# Patient Record
Sex: Female | Born: 1955
Health system: Southern US, Community
[De-identification: ages and names within clinical notes are randomized; demographics above are authoritative.]

## PROBLEM LIST (undated history)

## (undated) DIAGNOSIS — N393 Stress incontinence (female) (male): Secondary | ICD-10-CM

## (undated) DIAGNOSIS — F419 Anxiety disorder, unspecified: Secondary | ICD-10-CM

## (undated) DIAGNOSIS — L309 Dermatitis, unspecified: Secondary | ICD-10-CM

## (undated) DIAGNOSIS — E039 Hypothyroidism, unspecified: Secondary | ICD-10-CM

## (undated) DIAGNOSIS — I10 Essential (primary) hypertension: Secondary | ICD-10-CM

## (undated) DIAGNOSIS — F32A Depression, unspecified: Secondary | ICD-10-CM

## (undated) DIAGNOSIS — F329 Major depressive disorder, single episode, unspecified: Secondary | ICD-10-CM

## (undated) HISTORY — PX: TUBAL LIGATION: SHX77

## (undated) HISTORY — PX: BUNIONECTOMY: SHX129

## (undated) HISTORY — DX: Essential (primary) hypertension: I10

## (undated) HISTORY — DX: Anxiety disorder, unspecified: F41.9

## (undated) HISTORY — DX: Hypothyroidism, unspecified: E03.9

## (undated) HISTORY — DX: Dermatitis, unspecified: L30.9

## (undated) HISTORY — DX: Major depressive disorder, single episode, unspecified: F32.9

## (undated) HISTORY — DX: Depression, unspecified: F32.A

---

## 2004-02-07 ENCOUNTER — Other Ambulatory Visit: Admission: RE | Admit: 2004-02-07 | Discharge: 2004-02-07 | Payer: Self-pay | Admitting: Obstetrics and Gynecology

## 2005-02-03 ENCOUNTER — Ambulatory Visit (HOSPITAL_COMMUNITY): Admission: RE | Admit: 2005-02-03 | Discharge: 2005-02-03 | Payer: Self-pay | Admitting: Obstetrics and Gynecology

## 2005-09-23 ENCOUNTER — Encounter: Admission: RE | Admit: 2005-09-23 | Discharge: 2005-09-23 | Payer: Self-pay | Admitting: Family Medicine

## 2006-03-13 ENCOUNTER — Ambulatory Visit (HOSPITAL_COMMUNITY): Admission: RE | Admit: 2006-03-13 | Discharge: 2006-03-13 | Payer: Self-pay | Admitting: Obstetrics and Gynecology

## 2006-06-16 ENCOUNTER — Encounter: Admission: RE | Admit: 2006-06-16 | Discharge: 2006-06-16 | Payer: Self-pay | Admitting: Family Medicine

## 2007-01-01 ENCOUNTER — Encounter: Admission: RE | Admit: 2007-01-01 | Discharge: 2007-01-01 | Payer: Self-pay | Admitting: Endocrinology

## 2007-03-29 ENCOUNTER — Ambulatory Visit (HOSPITAL_COMMUNITY): Admission: RE | Admit: 2007-03-29 | Discharge: 2007-03-29 | Payer: Self-pay | Admitting: Obstetrics and Gynecology

## 2007-10-18 LAB — HM COLONOSCOPY

## 2007-12-07 ENCOUNTER — Ambulatory Visit (HOSPITAL_COMMUNITY): Admission: RE | Admit: 2007-12-07 | Discharge: 2007-12-07 | Payer: Self-pay | Admitting: Internal Medicine

## 2008-04-10 ENCOUNTER — Ambulatory Visit (HOSPITAL_COMMUNITY): Admission: RE | Admit: 2008-04-10 | Discharge: 2008-04-10 | Payer: Self-pay | Admitting: Obstetrics and Gynecology

## 2008-06-03 ENCOUNTER — Emergency Department (HOSPITAL_COMMUNITY): Admission: EM | Admit: 2008-06-03 | Discharge: 2008-06-03 | Payer: Self-pay | Admitting: Family Medicine

## 2009-04-13 ENCOUNTER — Ambulatory Visit (HOSPITAL_COMMUNITY): Admission: RE | Admit: 2009-04-13 | Discharge: 2009-04-13 | Payer: Self-pay | Admitting: Obstetrics and Gynecology

## 2010-04-19 ENCOUNTER — Ambulatory Visit (HOSPITAL_COMMUNITY): Admission: RE | Admit: 2010-04-19 | Discharge: 2010-04-19 | Payer: Self-pay | Admitting: Obstetrics and Gynecology

## 2010-04-30 LAB — HM PAP SMEAR: HM Pap smear: NORMAL

## 2011-05-02 ENCOUNTER — Other Ambulatory Visit (HOSPITAL_COMMUNITY): Payer: Self-pay | Admitting: Obstetrics and Gynecology

## 2011-05-02 DIAGNOSIS — Z1231 Encounter for screening mammogram for malignant neoplasm of breast: Secondary | ICD-10-CM

## 2011-05-07 ENCOUNTER — Other Ambulatory Visit: Payer: Self-pay | Admitting: Obstetrics and Gynecology

## 2011-05-07 DIAGNOSIS — Z1231 Encounter for screening mammogram for malignant neoplasm of breast: Secondary | ICD-10-CM

## 2011-05-07 DIAGNOSIS — M8580 Other specified disorders of bone density and structure, unspecified site: Secondary | ICD-10-CM

## 2011-05-14 ENCOUNTER — Ambulatory Visit
Admission: RE | Admit: 2011-05-14 | Discharge: 2011-05-14 | Disposition: A | Discharge status: Home / Self Care | Payer: 59 | Source: Ambulatory Visit | Attending: Obstetrics and Gynecology | Admitting: Obstetrics and Gynecology

## 2011-05-14 DIAGNOSIS — M8580 Other specified disorders of bone density and structure, unspecified site: Secondary | ICD-10-CM

## 2011-05-14 DIAGNOSIS — Z1231 Encounter for screening mammogram for malignant neoplasm of breast: Secondary | ICD-10-CM

## 2011-05-20 ENCOUNTER — Ambulatory Visit (HOSPITAL_COMMUNITY): Payer: 59

## 2012-04-29 ENCOUNTER — Other Ambulatory Visit: Payer: Self-pay | Admitting: Obstetrics and Gynecology

## 2012-04-29 DIAGNOSIS — Z1231 Encounter for screening mammogram for malignant neoplasm of breast: Secondary | ICD-10-CM

## 2012-05-20 ENCOUNTER — Ambulatory Visit
Admission: RE | Admit: 2012-05-20 | Discharge: 2012-05-20 | Disposition: A | Discharge status: Home / Self Care | Payer: 59 | Source: Ambulatory Visit | Attending: Obstetrics and Gynecology | Admitting: Obstetrics and Gynecology

## 2012-05-20 DIAGNOSIS — Z1231 Encounter for screening mammogram for malignant neoplasm of breast: Secondary | ICD-10-CM

## 2012-10-15 ENCOUNTER — Other Ambulatory Visit: Payer: Self-pay | Admitting: Physician Assistant

## 2012-10-15 DIAGNOSIS — E041 Nontoxic single thyroid nodule: Secondary | ICD-10-CM

## 2012-10-19 ENCOUNTER — Ambulatory Visit
Admission: RE | Admit: 2012-10-19 | Discharge: 2012-10-19 | Disposition: A | Discharge status: Home / Self Care | Payer: 59 | Source: Ambulatory Visit | Attending: Physician Assistant | Admitting: Physician Assistant

## 2012-10-19 DIAGNOSIS — E041 Nontoxic single thyroid nodule: Secondary | ICD-10-CM

## 2013-01-18 ENCOUNTER — Telehealth: Payer: Self-pay | Admitting: Physician Assistant

## 2013-01-18 DIAGNOSIS — E039 Hypothyroidism, unspecified: Secondary | ICD-10-CM

## 2013-01-18 MED ORDER — LEVOTHYROXINE SODIUM 112 MCG PO TABS: 112.0000 ug | ORAL_TABLET | Freq: Every day | ORAL | Status: DC

## 2013-01-18 NOTE — Telephone Encounter (Signed)
Medication refilled per protocol. 

## 2013-02-21 ENCOUNTER — Ambulatory Visit (INDEPENDENT_AMBULATORY_CARE_PROVIDER_SITE_OTHER): Payer: 59 | Admitting: Physician Assistant

## 2013-02-21 ENCOUNTER — Encounter: Payer: Self-pay | Admitting: Physician Assistant

## 2013-02-21 VITALS — BP 136/94 | HR 72 | Temp 97.0°F | Resp 18 | Ht 64.25 in | Wt 157.0 lb

## 2013-02-21 DIAGNOSIS — M19079 Primary osteoarthritis, unspecified ankle and foot: Secondary | ICD-10-CM

## 2013-02-21 DIAGNOSIS — I10 Essential (primary) hypertension: Secondary | ICD-10-CM

## 2013-02-21 DIAGNOSIS — M19072 Primary osteoarthritis, left ankle and foot: Secondary | ICD-10-CM

## 2013-02-21 DIAGNOSIS — M722 Plantar fascial fibromatosis: Secondary | ICD-10-CM

## 2013-02-21 DIAGNOSIS — E039 Hypothyroidism, unspecified: Secondary | ICD-10-CM

## 2013-02-21 DIAGNOSIS — F419 Anxiety disorder, unspecified: Secondary | ICD-10-CM | POA: Insufficient documentation

## 2013-02-21 DIAGNOSIS — F329 Major depressive disorder, single episode, unspecified: Secondary | ICD-10-CM

## 2013-02-21 DIAGNOSIS — F32A Depression, unspecified: Secondary | ICD-10-CM | POA: Insufficient documentation

## 2013-02-21 DIAGNOSIS — F411 Generalized anxiety disorder: Secondary | ICD-10-CM

## 2013-02-21 MED ORDER — CELECOXIB 200 MG PO CAPS: 200.0000 mg | ORAL_CAPSULE | Freq: Every day | ORAL | Status: DC

## 2013-02-21 NOTE — Progress Notes (Signed)
Patient ID: Yvonne Wu MRN: [id number], DOB: December 15, 1955, 57 y.o. Date of Encounter: @DATE @  Chief Complaint:  Chief Complaint  Patient presents with  . c/o left foot pain radiates up leg  hx of  wants rx celebrex    HPI: 57 y.o. year old female  presents with c/o foot pain. Also for routine f/u.  1- Says she saw Dr. Lestine Box regarding pain in her left foot in past. He dxed it as arthritis. She took Celebrex for a while and it had resolved. Recently developed same type of pain again. Pain across top of the foot, radiates up anterior shin at times. Wants refill on Celebrex.   2- Pain in arch of left foot also. Had plantar fasciitis in past-had injection. Had resolved. Sore again now.  3- Thyroid; At lab 2/14, TSH was slightly abn and dose was changed. She states she IS taking 112 mcg as directed.   4- Her husband passed away in Feb 24, 2023. See prior OV notes. She has seen me multiple times regarding anxiety/depression f/u-she told me about his diagnosis of acute leukemia back in 12/2010. Today she says she thinks the current medications are working ok. She does not think medicines need to be adjusted. Is taking the Wellbutrin. Current Klonopin is working for prn use. Needs no Rx now.   History reviewed. No pertinent past medical history.   Home Meds: See attached medication section for current medication list. Any medications entered into computer today will not appear on this note's list. The medications listed below were entered prior to today. Current Outpatient Prescriptions on File Prior to Visit  Medication Sig Dispense Refill  . levothyroxine (SYNTHROID, LEVOTHROID) 112 MCG tablet Take 1 tablet (112 mcg total) by mouth daily before breakfast.  90 tablet  0   No current facility-administered medications on file prior to visit.    Allergies: No Known Allergies  History   Social History  . Marital Status: Married    Spouse Name: N/A    Number of Children: N/A  . Years of  Education: N/A   Occupational History  . Not on file.   Social History Main Topics  . Smoking status: Former Smoker    Quit date: 02/22/1995  . Smokeless tobacco: Never Used  . Alcohol Use: No  . Drug Use: No  . Sexually Active: Not on file   Other Topics Concern  . Not on file   Social History Narrative  . No narrative on file    No family history on file.   Review of Systems:  See HPI for pertinent ROS. All other ROS negative.    Physical Exam: Blood pressure 136/94, pulse 72, temperature 97 F (36.1 C), temperature source Oral, resp. rate 18, height 5' 4.25" (1.632 m), weight 157 lb (71.215 kg)., Body mass index is 26.74 kg/(m^2). General:WNWD WF. Appears in no acute distress. Neck: Supple. No thyromegaly. Full ROM. No lymphadenopathy. Lungs: Clear bilaterally to auscultation without wheezes, rales, or rhonchi. Breathing is unlabored. Heart: RRR with S1 S2. No murmurs, rubs, or gallops. Abdomen: Soft, non-tender, non-distended with normoactive bowel sounds. No hepatomegaly. No rebound/guarding. No obvious abdominal masses. Musculoskeletal:  Strength and tone normal for age. Extremities/Skin: Left foot: No erythema, edema. Inspection nml. Pain with palpation at medial aspect at area b/t heel and arch.  Neuro: Alert and oriented X 3. Moves all extremities spontaneously. Gait is normal. CNII-XII grossly in tact. Psych:  Responds to questions appropriately with a normal affect.  ASSESSMENT AND PLAN:  57 y.o. year old female with  1. Osteoarthritis of left foot - celecoxib (CELEBREX) 200 MG capsule; Take 1 capsule (200 mg total) by mouth daily.  Dispense: 30 capsule; Refill: 11  2. Plantar fasciitis of left foot - celecoxib (CELEBREX) 200 MG capsule; Take 1 capsule (200 mg total) by mouth daily.  Dispense: 30 capsule; Refill: 11 She is aware to wear shoes with good arch support. Avoid walking bare foot. She has orthotices for her work shoes. Rec heel cups. Gave her  handout with stretches to do.   3. Unspecified hypothyroidism She did change dose from 125 to 112 mcg QD 11/2012. Recheck TSH now.   4. HTN (hypertension) At goal. Cont current meds. BMET nml 11/2012  5. Generalized anxiety disorder Stable, controlled with current medications  6. Depression Stable, controlled with current medications.I asked her if she needed anything regarding grieving-she says no--he was with hospice-this helped. She feels htat current meds are working and does not need change in medication.  7. Hypothyroidism See # 3 above.  - TSH   Signed, [address] [place], [place], [facility] 02/21/2013 4:48 PM

## 2013-02-22 ENCOUNTER — Telehealth: Payer: Self-pay | Admitting: Family Medicine

## 2013-02-22 DIAGNOSIS — E039 Hypothyroidism, unspecified: Secondary | ICD-10-CM

## 2013-02-22 LAB — TSH: TSH: 0.01 u[IU]/mL — ABNORMAL LOW (ref 0.350–4.500)

## 2013-02-22 MED ORDER — LEVOTHYROXINE SODIUM 100 MCG PO TABS: 100.0000 ug | ORAL_TABLET | Freq: Every day | ORAL | Status: DC

## 2013-02-22 NOTE — Telephone Encounter (Signed)
Message copied by Donne Anon on Tue Feb 22, 2013 10:07 AM ------      Message from: Allayne Butcher      Created: Tue Feb 22, 2013  7:29 AM       Can decrease dose to 100 mcg QD.      Recheck TSH 6 weeks.      FYI: In February decreased from 125 to 112.            Send in new RX for levothyroxine 100 mcg one po QD      # 30/ One      Order f/u TSH ------

## 2013-02-22 NOTE — Telephone Encounter (Signed)
Pt called for thyroid lab results.  Told TSH still to low and need to decrease levothyroxine to daily.  New Rx to pharmacy and pt told to repeat lab work in 6 weeks.  Order placed for lab.

## 2013-03-17 ENCOUNTER — Telehealth: Payer: Self-pay | Admitting: Physician Assistant

## 2013-03-17 MED ORDER — OLMESARTAN MEDOXOMIL 20 MG PO TABS: 20.0000 mg | ORAL_TABLET | Freq: Every day | ORAL | Status: DC

## 2013-03-17 NOTE — Telephone Encounter (Signed)
Benicar 20mg  take one tablet by mouth daily #30 last refill04/23/2014 last ov 02/21/13

## 2013-03-17 NOTE — Telephone Encounter (Signed)
Medication refilled per protocol. 

## 2013-04-15 ENCOUNTER — Other Ambulatory Visit: Payer: Self-pay

## 2013-04-15 DIAGNOSIS — Z1231 Encounter for screening mammogram for malignant neoplasm of breast: Secondary | ICD-10-CM

## 2013-04-19 ENCOUNTER — Other Ambulatory Visit: Payer: 59

## 2013-04-19 ENCOUNTER — Other Ambulatory Visit: Payer: Self-pay | Admitting: Physician Assistant

## 2013-04-19 DIAGNOSIS — R7989 Other specified abnormal findings of blood chemistry: Secondary | ICD-10-CM

## 2013-04-19 DIAGNOSIS — E039 Hypothyroidism, unspecified: Secondary | ICD-10-CM

## 2013-04-19 NOTE — Telephone Encounter (Signed)
?   OK to Refill  

## 2013-04-19 NOTE — Telephone Encounter (Signed)
Approved for 60 plus 2 additional refills

## 2013-04-19 NOTE — Telephone Encounter (Signed)
Med called out 

## 2013-04-20 LAB — TSH: TSH: 0.009 u[IU]/mL — ABNORMAL LOW (ref 0.350–4.500)

## 2013-04-21 MED ORDER — LEVOTHYROXINE SODIUM 88 MCG PO TABS: 88.0000 ug | ORAL_TABLET | Freq: Every day | ORAL | Status: DC

## 2013-04-21 NOTE — Addendum Note (Signed)
Addended by: Elvina Mattes T on: 04/21/2013 10:44 AM   Modules accepted: Orders

## 2013-05-19 ENCOUNTER — Other Ambulatory Visit: Payer: Self-pay | Admitting: Obstetrics and Gynecology

## 2013-05-19 DIAGNOSIS — M8580 Other specified disorders of bone density and structure, unspecified site: Secondary | ICD-10-CM

## 2013-05-23 ENCOUNTER — Ambulatory Visit: Payer: 59

## 2013-06-13 ENCOUNTER — Other Ambulatory Visit: Payer: 59

## 2013-06-13 DIAGNOSIS — R7989 Other specified abnormal findings of blood chemistry: Secondary | ICD-10-CM

## 2013-06-13 DIAGNOSIS — E039 Hypothyroidism, unspecified: Secondary | ICD-10-CM

## 2013-06-15 ENCOUNTER — Ambulatory Visit: Payer: 59

## 2013-06-15 ENCOUNTER — Other Ambulatory Visit: Payer: 59

## 2013-06-16 ENCOUNTER — Ambulatory Visit
Admission: RE | Admit: 2013-06-16 | Discharge: 2013-06-16 | Disposition: A | Discharge status: Home / Self Care | Payer: 59 | Source: Ambulatory Visit | Attending: Obstetrics and Gynecology | Admitting: Obstetrics and Gynecology

## 2013-06-16 ENCOUNTER — Telehealth: Payer: Self-pay | Admitting: Family Medicine

## 2013-06-16 ENCOUNTER — Ambulatory Visit
Admission: RE | Admit: 2013-06-16 | Discharge: 2013-06-16 | Disposition: A | Discharge status: Home / Self Care | Payer: 59 | Source: Ambulatory Visit

## 2013-06-16 DIAGNOSIS — E039 Hypothyroidism, unspecified: Secondary | ICD-10-CM

## 2013-06-16 DIAGNOSIS — Z1231 Encounter for screening mammogram for malignant neoplasm of breast: Secondary | ICD-10-CM

## 2013-06-16 DIAGNOSIS — M8580 Other specified disorders of bone density and structure, unspecified site: Secondary | ICD-10-CM

## 2013-06-16 LAB — HM MAMMOGRAPHY: HM MAMMO: NORMAL

## 2013-06-16 MED ORDER — LEVOTHYROXINE SODIUM 75 MCG PO TABS: 75.0000 ug | ORAL_TABLET | Freq: Every day | ORAL | Status: DC

## 2013-06-16 NOTE — Telephone Encounter (Signed)
Message copied by Donne Anon on Thu Jun 16, 2013  2:19 PM ------      Message from: Allayne Butcher      Created: Tue Jun 14, 2013  7:55 PM       Tell her that I am sorry for all the adjustments and lab checks.       But, this has been an unpredictable response !!       02/21/13 her TSH was 0.010 so dose was decreased to 100 mcg      04/19/13 her TSH was 0.009 so her dose was decreased to 88 mcg      Now 06/13/13 her TSH is 0.021--Tell her to decrease dose to 75 mcg.       Can wait 3 months to recheck TSH if she wants !!!      Order Rx for and TSH ------

## 2013-06-16 NOTE — Telephone Encounter (Signed)
Pt aware of lab result and need to change thyroid dose again.  Repeat labs in 3 months.  Rx and lab ordered

## 2013-06-27 ENCOUNTER — Encounter: Payer: Self-pay | Admitting: Physician Assistant

## 2013-06-27 ENCOUNTER — Ambulatory Visit (INDEPENDENT_AMBULATORY_CARE_PROVIDER_SITE_OTHER): Payer: 59 | Admitting: Physician Assistant

## 2013-06-27 VITALS — BP 128/90 | HR 64 | Temp 98.1°F | Resp 18 | Wt 161.0 lb

## 2013-06-27 DIAGNOSIS — M545 Low back pain, unspecified: Secondary | ICD-10-CM

## 2013-06-27 DIAGNOSIS — M5432 Sciatica, left side: Secondary | ICD-10-CM

## 2013-06-27 DIAGNOSIS — M5430 Sciatica, unspecified side: Secondary | ICD-10-CM

## 2013-06-27 MED ORDER — CYCLOBENZAPRINE HCL 10 MG PO TABS: 10.0000 mg | ORAL_TABLET | Freq: Three times a day (TID) | ORAL | Status: DC | PRN

## 2013-06-27 MED ORDER — PREDNISONE 20 MG PO TABS: ORAL_TABLET | ORAL | Status: DC

## 2013-06-27 NOTE — Progress Notes (Signed)
Patient ID: Yvonne Wu MRN: [id number], DOB: 12/11/1955, 58 y.o. Date of Encounter: 06/27/2013, 12:31 PM    Chief Complaint:  Chief Complaint  Patient presents with  . left hip pain    worse at night x 2-3 wks     HPI: 57 y.o. year old white female is here for evaluation of discomfort starting in her left buttock going down her left leg to the knee area.  This has been a new problem for her recently. She says it just started approximately 3 weeks ago. Just prior to the time of the onset of the symptoms she had done no unusual activity. She has done no heavy lifting etc. She had no acute injury or trauma. She says that she has mostly noticed the discomfort during the night when she is sleeping. She is actually awoken by this discomfort. As well in the morning when she begins to wake up she says that she has to go ahead and get up out of bed and move around because the discomfort is there at that time. Also she notices the discomfort when she's sitting in the car. She says that when she does a lot of housework and cleaning the house she does feel some tightness and discomfort across her low back. Otherwise no other low back pain. No weakness in the leg or foot. No incontinence of urine or stool.  She works at [employer]. She does a lot of standing bending and lifting. Says that most she lifts is about 35 pounds. But this is her usual activity that she has done for many years. Again she says she has done no unusual lifting or activity around the time of onset of symptoms.  Home Meds: See attached medication section for any medications that were entered at today's visit. The computer does not put those onto this list.The following list is a list of meds entered prior to today's visit.   Current Outpatient Prescriptions on File Prior to Visit  Medication Sig Dispense Refill  . buPROPion (WELLBUTRIN XL) 300 MG 24 hr tablet Take 300 mg by mouth daily.      . celecoxib (CELEBREX) 200 MG capsule  Take 1 capsule (200 mg total) by mouth daily.  30 capsule  11  . cholecalciferol (VITAMIN D) 1000 UNITS tablet Take 1,000 Units by mouth daily.      . clonazePAM (KLONOPIN) 0.5 MG tablet TAKE (1) TABLET BY MOUTH TWICE A DAY AS NEEDED.  60 tablet  2  . fluticasone (FLONASE) 50 MCG/ACT nasal spray Place 2 sprays into the nose as needed for rhinitis.      Marland Kitchen levothyroxine (SYNTHROID, LEVOTHROID) 75 MCG tablet Take 1 tablet (75 mcg total) by mouth daily.  90 tablet  0  . olmesartan (BENICAR) 20 MG tablet Take 1 tablet (20 mg total) by mouth daily.  30 tablet  5   No current facility-administered medications on file prior to visit.    Allergies: No Known Allergies    Review of Systems: See HPI for pertinent ROS. All other ROS negative.    Physical Exam: Blood pressure 128/90, pulse 64, temperature 98.1 F (36.7 C), temperature source Oral, resp. rate 18, weight 161 lb (73.029 kg)., Body mass index is 27.42 kg/(m^2). General: Well-nourished well-developed white female Appears in no acute distress. Sitting in the chair in exam room comfortably. Lungs: Clear bilaterally to auscultation without wheezes, rales, or rhonchi. Breathing is unlabored. Heart: Regular rhythm. No murmurs, rubs, or gallops. Msk:  Strength and  tone normal for age. Low back: There is minimal pain and tightness in the lumbar low back with palpation.  There is tenderness with palpation of the left sciatic notch compared to the right. Bilateral straight straight leg raise is normal. Bilateral hip abduction is normal. Patellar reflexes are 2+ and equal bilaterally and normal. Neuro: Alert and oriented X 3. Moves all extremities spontaneously. Gait is normal. CNII-XII grossly in tact. Psych:  Responds to questions appropriately with a normal affect.     ASSESSMENT AND PLAN:  57 y.o. year old female with  1. Sciatica, left - predniSONE (DELTASONE) 20 MG tablet; Take 3 daily for 2 days, then 2 daily for 2 days, then 1 daily for  2 days.  Dispense: 12 tablet; Refill: 0  2. Low back pain - cyclobenzaprine (FLEXERIL) 10 MG tablet; Take 1 tablet (10 mg total) by mouth 3 (three) times daily as needed for muscle spasms.  Dispense: 30 tablet; Refill: 0 Cautioned regarding possible drowsiness with Lexapro. If it causes drowsiness she will just use at nighttime.  Follow up if symptoms do not resolve with the use of prednisone. Also cautioned of possible adverse effects of prednisone.  She is going to schedule a complete physical exam around the middle of October when her next TSH is due. She will come fasting so we can do fasting labs at the time of the visit.  [address] [place], [place], [facility] 06/27/2013 12:31 PM

## 2013-08-08 ENCOUNTER — Encounter: Payer: Self-pay | Admitting: Physician Assistant

## 2013-08-08 ENCOUNTER — Ambulatory Visit
Admission: RE | Admit: 2013-08-08 | Discharge: 2013-08-08 | Disposition: A | Discharge status: Home / Self Care | Payer: 59 | Source: Ambulatory Visit | Attending: Physician Assistant | Admitting: Physician Assistant

## 2013-08-08 ENCOUNTER — Ambulatory Visit (INDEPENDENT_AMBULATORY_CARE_PROVIDER_SITE_OTHER): Payer: 59 | Admitting: Physician Assistant

## 2013-08-08 VITALS — BP 118/94 | HR 64 | Temp 98.3°F | Resp 18 | Ht 62.75 in | Wt 162.0 lb

## 2013-08-08 DIAGNOSIS — F411 Generalized anxiety disorder: Secondary | ICD-10-CM

## 2013-08-08 DIAGNOSIS — F32A Depression, unspecified: Secondary | ICD-10-CM

## 2013-08-08 DIAGNOSIS — F329 Major depressive disorder, single episode, unspecified: Secondary | ICD-10-CM

## 2013-08-08 DIAGNOSIS — M545 Low back pain, unspecified: Secondary | ICD-10-CM

## 2013-08-08 DIAGNOSIS — F419 Anxiety disorder, unspecified: Secondary | ICD-10-CM

## 2013-08-08 DIAGNOSIS — I10 Essential (primary) hypertension: Secondary | ICD-10-CM

## 2013-08-08 DIAGNOSIS — E039 Hypothyroidism, unspecified: Secondary | ICD-10-CM

## 2013-08-08 DIAGNOSIS — Z0000 Encounter for general adult medical examination without abnormal findings: Secondary | ICD-10-CM

## 2013-08-08 LAB — LIPID PANEL
Cholesterol: 199 mg/dL (ref 0–200)
HDL: 58 mg/dL (ref >39)
Total CHOL/HDL Ratio: 3.4 Ratio
VLDL: 13 mg/dL (ref 0–40)

## 2013-08-08 LAB — COMPLETE METABOLIC PANEL WITH GFR
AST: 15 U/L (ref 0–37)
Albumin: 4.5 g/dL (ref 3.5–5.2)
Alkaline Phosphatase: 92 U/L (ref 39–117)
BUN: 9 mg/dL (ref 6–23)
Potassium: 4.3 mEq/L (ref 3.5–5.3)
Sodium: 139 mEq/L (ref 135–145)
Total Bilirubin: 0.5 mg/dL (ref 0.3–1.2)

## 2013-08-08 LAB — CBC WITH DIFFERENTIAL/PLATELET
Basophils Absolute: 0 10*3/uL (ref 0.0–0.1)
Basophils Relative: 1 % (ref 0–1)
MCHC: 34 g/dL (ref 30.0–36.0)
Neutro Abs: 1.8 10*3/uL (ref 1.7–7.7)
Neutrophils Relative %: 52 % (ref 43–77)
RDW: 14.1 % (ref 11.5–15.5)

## 2013-08-08 NOTE — Progress Notes (Signed)
Patient ID: Yvonne Wu MRN: [id number], DOB: 1956-04-11, 57 y.o. Date of Encounter: 08/08/2013,   Chief Complaint: Physical (CPE)  HPI: 57 y.o. y/o white female  here for CPE.   She sees her gynecologist for GYN exam.  She has no complaints today.   Review of Systems: Consitutional: No fever, chills, fatigue, night sweats, lymphadenopathy. No significant/unexplained weight changes. Eyes: No visual changes, eye redness, or discharge. ENT/Mouth: No ear pain, sore throat, nasal drainage, or sinus pain. Cardiovascular: No chest pressure,heaviness, tightness or squeezing, even with exertion. No increased shortness of breath or dyspnea on exertion.No palpitations, edema, orthopnea, PND. Respiratory: No cough, hemoptysis, SOB, or wheezing. Gastrointestinal: No anorexia, dysphagia, reflux, pain, nausea, vomiting, hematemesis, diarrhea, constipation, BRBPR, or melena. Breast: No mass, nodules, bulging, or retraction. No skin changes or inflammation. No nipple discharge. No lymphadenopathy. Genitourinary: No dysuria, hematuria, incontinence, vaginal discharge, pruritis, burning, abnormal bleeding, or pain. Musculoskeletal: No decreased ROM, No joint pain or swelling. No significant pain in neck, back, or extremities. Skin: No rash, pruritis, or concerning lesions. Neurological: No headache, dizziness, syncope, seizures, tremors, memory loss, coordination problems, or paresthesias. Psychological: No anxiety, depression, hallucinations, SI/HI. Endocrine: No polydipsia, polyphagia, polyuria, or known diabetes.No increased fatigue. No palpitations/rapid heart rate. No significant/unexplained weight change. All other systems were reviewed and are otherwise negative.  Past Medical History  Diagnosis Date  . Anxiety   . Depression   . Hypertension   . Hypothyroid      Past Surgical History  Procedure Laterality Date  . Tubal ligation      Home Meds:  Current Outpatient Prescriptions  on File Prior to Visit  Medication Sig Dispense Refill  . buPROPion (WELLBUTRIN XL) 300 MG 24 hr tablet Take 300 mg by mouth daily.      . Calcium Carbonate-Vitamin D (CALCIUM + D PO) Take 1 tablet by mouth 2 (two) times daily.      . celecoxib (CELEBREX) 200 MG capsule Take 1 capsule (200 mg total) by mouth daily.  30 capsule  11  . cholecalciferol (VITAMIN D) 1000 UNITS tablet Take 1,000 Units by mouth daily.      . cyclobenzaprine (FLEXERIL) 10 MG tablet Take 1 tablet (10 mg total) by mouth 3 (three) times daily as needed for muscle spasms.  30 tablet  0  . fluticasone (FLONASE) 50 MCG/ACT nasal spray Place 2 sprays into the nose as needed for rhinitis.      Marland Kitchen levothyroxine (SYNTHROID, LEVOTHROID) 75 MCG tablet Take 1 tablet (75 mcg total) by mouth daily.  90 tablet  0  . olmesartan (BENICAR) 20 MG tablet Take 1 tablet (20 mg total) by mouth daily.  30 tablet  5  . clonazePAM (KLONOPIN) 0.5 MG tablet TAKE (1) TABLET BY MOUTH TWICE A DAY AS NEEDED.  60 tablet  2   No current facility-administered medications on file prior to visit.    Allergies: No Known Allergies  History   Social History  . Marital Status: Widowed    Spouse Name: N/A    Number of Children: N/A  . Years of Education: N/A   Occupational History  . Not on file.   Social History Main Topics  . Smoking status: Former Smoker    Quit date: 02/22/1995  . Smokeless tobacco: Never Used  . Alcohol Use: No  . Drug Use: No  . Sexual Activity: Not Currently   Other Topics Concern  . Not on file   Social History Narrative   Works  at [employer].   Involves a lot of standing and bending.   Husband passed away in 04-21-14with leukemia.   Patient states that her daughter is age 30 (as of 55) and has bipolar. This daughter lives with her.   This daughter has a child who is 11 years old and another child he was 71 years old. They all live with the patient.   Patient also has a son who lives in [place].           Family History  Problem Relation Age of Onset  . Asthma Mother   . Mental illness Mother   . Cancer Father 17    lung cancer    Physical Exam: Blood pressure 118/94, pulse 64, temperature 98.3 F (36.8 C), temperature source Oral, resp. rate 18, height 5' 2.75" (1.594 m), weight 162 lb (73.483 kg)., Body mass index is 28.92 kg/(m^2). General: Well developed, well nourished,WF. Appears  in no acute distress. HEENT: Normocephalic, atraumatic. Conjunctiva pink, sclera non-icteric. Pupils 2 mm constricting to 1 mm, round, regular, and equally reactive to light and accomodation. EOMI. Internal auditory canal clear. TMs with good cone of light and without pathology. Nasal mucosa pink. Nares are without discharge. No sinus tenderness. Oral mucosa pink.  Pharynx without exudate.   Neck: Supple. Trachea midline. No thyromegaly. Full ROM. No lymphadenopathy.No Carotid Bruits. Lungs: Clear to auscultation bilaterally without wheezes, rales, or rhonchi. Breathing is of normal effort and unlabored. Cardiovascular: RRR with S1 S2. No murmurs, rubs, or gallops. Distal pulses 2+ symmetrically. No carotid or abdominal bruits. Breast: Per Gyn. Abdomen: Soft, non-tender, non-distended with normoactive bowel sounds. No hepatosplenomegaly or masses. No rebound/guarding. No CVA tenderness. No hernias.  Genitourinary: Per Gyn. Musculoskeletal: Full range of motion and 5/5 strength throughout. Without swelling, atrophy, tenderness, crepitus, or warmth. Extremities without clubbing, cyanosis, or edema. Calves supple. Skin: Warm and moist without erythema, ecchymosis, wounds, or rash. Neuro: A+Ox3. CN II-XII grossly intact. Moves all extremities spontaneously. Full sensation throughout. Normal gait. DTR 2+ throughout upper and lower extremities. Finger to nose intact. Psych:  Responds to questions appropriately with a normal affect.   Assessment/Plan:  57 y.o. y/o female here for CPE 1. Visit for preventive  health examination  A. Screening Labs; Seh is fasting:  - CBC with Differential - COMPLETE METABOLIC PANEL WITH GFR - Lipid panel - TSH - Vit D  25 hydroxy (rtn osteoporosis monitoring)  B. Pap- Per Gyn C. Mammogram: Per Gyn D. DEXA: Per Gyn E. screening colonoscopy: This was done December 2008. Normal. Repeat 10 years. F. Immunizations: She states that she is Artie had her influenza vaccine this year. Tetanus vaccine: She says that she had a finger laceration 6 years ago this should have been updated at that time 2008 Pneumovax we'll give it age 38 To back school given age 74  2. Anxiety Controlled on current medication. Says that she has not needed her Klonopin and in the last month or so.  3. Depression Controlled on currnet meds.    4. Hypertension At goal.  5. Hypothyroid Has been adjusted recently. She is taking the current dose as directed the last lab check which is a 75 mcg. - TSH  6. Low back pain He had an office visit with me on 06/27/13 at which time she reported pain from her left but not down to her left knee. She says that the pain resolved while on prednisone. However she reports average is the last few days she  has been noticing some discomfort up toward the left but talked again. We'll obtain x-ray. 2 Flexeril as needed and Celebrex as needed. Continue heat and stretching. - DG Lumbar Spine Complete; Future  ROV 6 months, sooner if needed.  Signed, [address] [place], [place], [facility] 08/08/2013 9:19 AM

## 2013-08-09 LAB — VITAMIN D 25 HYDROXY (VIT D DEFICIENCY, FRACTURES): Vit D, 25-Hydroxy: 53 ng/mL (ref 30–89)

## 2013-08-12 ENCOUNTER — Other Ambulatory Visit: Payer: Self-pay | Admitting: Physician Assistant

## 2013-08-15 ENCOUNTER — Telehealth: Payer: Self-pay | Admitting: Family Medicine

## 2013-08-15 DIAGNOSIS — E039 Hypothyroidism, unspecified: Secondary | ICD-10-CM

## 2013-08-15 MED ORDER — LEVOTHYROXINE SODIUM 50 MCG PO TABS: 50.0000 ug | ORAL_TABLET | Freq: Every day | ORAL | Status: DC

## 2013-08-15 NOTE — Telephone Encounter (Signed)
Message copied by Donne Anon on Mon Aug 15, 2013  1:07 PM ------      Message from: Allayne Butcher      Created: Wed Aug 10, 2013 11:45 AM       Thyroid is still slightly off.      Currently taking 75 mcg a day.      Decrease dose to 50 mcg daily.      Recheck labs in 6 weeks.      Kim please order new medication for #30 with 1 refill and order followup TSH.            All other labs look great. ------

## 2013-08-15 NOTE — Telephone Encounter (Signed)
CPE was 10/20.  Last RF 9/8 #30  Ok Refill?

## 2013-08-15 NOTE — Telephone Encounter (Signed)
Rx sent 

## 2013-08-15 NOTE — Telephone Encounter (Signed)
Pt called back.  Aware of lab and xray results.  New dose of thyroid med sent and pt aware need follow up labs in 6 weeks

## 2013-08-15 NOTE — Telephone Encounter (Signed)
Approved for #60+2 additional refills 

## 2013-08-25 ENCOUNTER — Other Ambulatory Visit: Payer: Self-pay

## 2013-09-26 ENCOUNTER — Other Ambulatory Visit: Payer: Self-pay | Admitting: Physician Assistant

## 2013-09-27 NOTE — Telephone Encounter (Signed)
Medication refilled per protocol. 

## 2013-10-04 ENCOUNTER — Other Ambulatory Visit: Payer: 59

## 2013-10-04 DIAGNOSIS — E039 Hypothyroidism, unspecified: Secondary | ICD-10-CM

## 2013-10-05 LAB — TSH: TSH: 16.371 u[IU]/mL — ABNORMAL HIGH (ref 0.350–4.500)

## 2013-10-10 ENCOUNTER — Telehealth: Payer: Self-pay | Admitting: Family Medicine

## 2013-10-10 DIAGNOSIS — E039 Hypothyroidism, unspecified: Secondary | ICD-10-CM

## 2013-10-10 MED ORDER — SYNTHROID 75 MCG PO TABS: 75.0000 ug | ORAL_TABLET | Freq: Every day | ORAL | Status: DC

## 2013-10-10 NOTE — Telephone Encounter (Signed)
Message copied by Donne Anon on Mon Oct 10, 2013  4:01 PM ------      Message from: Allayne Butcher      Created: Thu Oct 06, 2013  7:57 AM       I have reviewed all of her TSHs and all of her dose adjustments beginning with the one May 5.      They make no sense!!        The only thing that I can think of as a possible explanation is that the generic thyroid med can have a wide range of active medication in them.       The Brand name pill is more exact.       I recommend use Brand name Synthroid for the next 6 weeks at 75 mcg then recheck TSH on this dose.       Send RX- make sure BRAND name. Order TSH. ------

## 2013-10-10 NOTE — Telephone Encounter (Signed)
Rx to pharmacy. Lab ordered.  Have lmtcb for pt to give results

## 2013-10-14 ENCOUNTER — Other Ambulatory Visit: Payer: Self-pay | Admitting: Physician Assistant

## 2013-10-14 NOTE — Telephone Encounter (Signed)
Spoke to patient. Aware of lab results and need to get Brand name synthroid.  Recheck labs in 6 weeks

## 2013-10-14 NOTE — Telephone Encounter (Signed)
Medication refilled per protocol. 

## 2013-11-14 ENCOUNTER — Other Ambulatory Visit: Payer: Self-pay | Admitting: Physician Assistant

## 2013-11-14 NOTE — Telephone Encounter (Signed)
Medication refilled per protocol. 

## 2013-11-14 NOTE — Telephone Encounter (Signed)
Approved for #30+2 additional refills 

## 2013-11-14 NOTE — Telephone Encounter (Signed)
Last RF 10/24 #30 + 2  LastOV CPE 10/20  OK refill?

## 2013-12-05 ENCOUNTER — Other Ambulatory Visit: Payer: 59

## 2013-12-05 DIAGNOSIS — E039 Hypothyroidism, unspecified: Secondary | ICD-10-CM

## 2013-12-05 LAB — TSH: TSH: 2.87 u[IU]/mL (ref 0.350–4.500)

## 2013-12-07 ENCOUNTER — Encounter: Payer: Self-pay | Admitting: Family Medicine

## 2013-12-07 ENCOUNTER — Telehealth: Payer: Self-pay | Admitting: Family Medicine

## 2013-12-07 DIAGNOSIS — E039 Hypothyroidism, unspecified: Secondary | ICD-10-CM

## 2013-12-07 MED ORDER — SYNTHROID 75 MCG PO TABS: 75.0000 ug | ORAL_TABLET | Freq: Every day | ORAL | Status: DC

## 2013-12-07 NOTE — Telephone Encounter (Signed)
Message copied by Olena Mater on Wed Dec 07, 2013 12:54 PM ------      Message from: Dena Billet      Created: Tue Dec 06, 2013 10:02 AM       TSH is now normal. Continue current dose of thyroid medication and recheck months. ------

## 2013-12-07 NOTE — Telephone Encounter (Signed)
letter to patient with normal TSH result.  Thyroid med refilled for 6 months

## 2014-02-24 ENCOUNTER — Other Ambulatory Visit: Payer: Self-pay | Admitting: Physician Assistant

## 2014-02-27 NOTE — Telephone Encounter (Signed)
Overdue for 6 mth visit.  Refills denied

## 2014-03-02 ENCOUNTER — Encounter: Payer: Self-pay | Admitting: Physician Assistant

## 2014-03-02 ENCOUNTER — Ambulatory Visit (INDEPENDENT_AMBULATORY_CARE_PROVIDER_SITE_OTHER): Payer: 59 | Admitting: Physician Assistant

## 2014-03-02 ENCOUNTER — Other Ambulatory Visit: Payer: Self-pay | Admitting: Family Medicine

## 2014-03-02 VITALS — BP 126/88 | HR 68 | Temp 97.9°F | Resp 18 | Wt 168.0 lb

## 2014-03-02 DIAGNOSIS — I10 Essential (primary) hypertension: Secondary | ICD-10-CM

## 2014-03-02 DIAGNOSIS — M545 Low back pain, unspecified: Secondary | ICD-10-CM

## 2014-03-02 DIAGNOSIS — E039 Hypothyroidism, unspecified: Secondary | ICD-10-CM

## 2014-03-02 DIAGNOSIS — M19072 Primary osteoarthritis, left ankle and foot: Secondary | ICD-10-CM

## 2014-03-02 DIAGNOSIS — F329 Major depressive disorder, single episode, unspecified: Secondary | ICD-10-CM

## 2014-03-02 DIAGNOSIS — F32A Depression, unspecified: Secondary | ICD-10-CM

## 2014-03-02 DIAGNOSIS — F3289 Other specified depressive episodes: Secondary | ICD-10-CM

## 2014-03-02 DIAGNOSIS — M722 Plantar fascial fibromatosis: Secondary | ICD-10-CM

## 2014-03-02 DIAGNOSIS — F419 Anxiety disorder, unspecified: Secondary | ICD-10-CM

## 2014-03-02 DIAGNOSIS — E559 Vitamin D deficiency, unspecified: Secondary | ICD-10-CM

## 2014-03-02 DIAGNOSIS — F411 Generalized anxiety disorder: Secondary | ICD-10-CM

## 2014-03-02 LAB — COMPLETE METABOLIC PANEL WITH GFR
ALK PHOS: 83 U/L (ref 39–117)
ALT: 14 U/L (ref 0–35)
AST: 18 U/L (ref 0–37)
Albumin: 4.7 g/dL (ref 3.5–5.2)
BUN: 13 mg/dL (ref 6–23)
CALCIUM: 9.5 mg/dL (ref 8.4–10.5)
CHLORIDE: 103 meq/L (ref 96–112)
CO2: 27 mEq/L (ref 19–32)
CREATININE: 0.85 mg/dL (ref 0.50–1.10)
GFR, Est African American: 88 mL/min
GFR, Est Non African American: 76 mL/min
Glucose, Bld: 80 mg/dL (ref 70–99)
Potassium: 4.4 mEq/L (ref 3.5–5.3)
Sodium: 139 mEq/L (ref 135–145)
Total Bilirubin: 0.6 mg/dL (ref 0.2–1.2)
Total Protein: 7.1 g/dL (ref 6.0–8.3)

## 2014-03-02 LAB — TSH: TSH: 5.629 u[IU]/mL — AB (ref 0.350–4.500)

## 2014-03-02 MED ORDER — CLONAZEPAM 0.5 MG PO TABS
0.5000 mg | ORAL_TABLET | Freq: Two times a day (BID) | ORAL | Status: DC | PRN
Start: 2014-03-02 — End: 2014-09-12

## 2014-03-02 MED ORDER — CYCLOBENZAPRINE HCL 10 MG PO TABS: 10.0000 mg | ORAL_TABLET | Freq: Three times a day (TID) | ORAL | Status: DC | PRN

## 2014-03-02 MED ORDER — CELECOXIB 200 MG PO CAPS: 200.0000 mg | ORAL_CAPSULE | Freq: Every day | ORAL | Status: DC

## 2014-03-02 NOTE — Progress Notes (Signed)
Patient ID: Yvonne Wu MRN: [id number], DOB: 08/22/1956, 58 y.o. Date of Encounter: 03/02/2014,   Chief Complaint: Routine followup of blood pressure thyroid et Ronney Asters.  HPI: 58 y.o. y/o white female  here for above.   She has no complaints today. She says that pretty soon after her last visit here she was able to go off of her Wellbutrin. She says that she's been feeling fine without this. She's not feeling anxious or depressed. Says that she very rarely ever uses the clonazepam but would like to continue to have it available if needed. Says she is probably going to be moving toward evening. Says that she sold her house and is currently renting. Says that she's considering building a house near her mom.  (NOTE: I have been seeing her for years. He was having situational anxiety and depression because her husband had cancer. He died of cancer in the past year.)  Review of Systems: Consitutional: No fever, chills, fatigue, night sweats, lymphadenopathy. No significant/unexplained weight changes. Eyes: No visual changes, eye redness, or discharge. ENT/Mouth: No ear pain, sore throat, nasal drainage, or sinus pain. Cardiovascular: No chest pressure,heaviness, tightness or squeezing, even with exertion. No increased shortness of breath or dyspnea on exertion.No palpitations, edema, orthopnea, PND. Respiratory: No cough, hemoptysis, SOB, or wheezing. Gastrointestinal: No anorexia, dysphagia, reflux, pain, nausea, vomiting, hematemesis, diarrhea, constipation, BRBPR, or melena. Breast: No mass, nodules, bulging, or retraction. No skin changes or inflammation. No nipple discharge. No lymphadenopathy. Genitourinary: No dysuria, hematuria, incontinence, vaginal discharge, pruritis, burning, abnormal bleeding, or pain. Musculoskeletal: No decreased ROM, No joint pain or swelling. No significant pain in neck, back, or extremities. Skin: No rash, pruritis, or concerning lesions. Neurological: No  headache, dizziness, syncope, seizures, tremors, memory loss, coordination problems, or paresthesias. Psychological: No anxiety, depression, hallucinations, SI/HI. Endocrine: No polydipsia, polyphagia, polyuria, or known diabetes.No increased fatigue. No palpitations/rapid heart rate. No significant/unexplained weight change. All other systems were reviewed and are otherwise negative.  Past Medical History  Diagnosis Date  . Anxiety   . Depression   . Hypertension   . Hypothyroid      Past Surgical History  Procedure Laterality Date  . Tubal ligation      Home Meds:  Current Outpatient Prescriptions on File Prior to Visit  Medication Sig Dispense Refill  . BENICAR 20 MG tablet TAKE ONE TABLET DAILY.  30 tablet  5  . Calcium Carbonate-Vitamin D (CALCIUM + D PO) Take 1 tablet by mouth 2 (two) times daily.      . celecoxib (CELEBREX) 200 MG capsule Take 1 capsule (200 mg total) by mouth daily.  30 capsule  11  . cholecalciferol (VITAMIN D) 1000 UNITS tablet Take 1,000 Units by mouth daily.      . cyclobenzaprine (FLEXERIL) 10 MG tablet TAKE (1) TABLET BY MOUTH (3) TIMES DAILY AS NEEDED FOR MUSCLE SPASMS.  30 tablet  2  . fluticasone (FLONASE) 50 MCG/ACT nasal spray USE 2 SPRAYS IN EACH NOSTRIL ONCE DAILY.  16 g  11  . SYNTHROID 75 MCG tablet Take 1 tablet (75 mcg total) by mouth daily before breakfast.  30 tablet  6  . clonazePAM (KLONOPIN) 0.5 MG tablet TAKE (1) TABLET BY MOUTH TWICE A DAY AS NEEDED.  60 tablet  2   No current facility-administered medications on file prior to visit.    Allergies: No Known Allergies  History   Social History  . Marital Status: Widowed    Spouse Name: N/A  Number of Children: N/A  . Years of Education: N/A   Occupational History  . Not on file.   Social History Main Topics  . Smoking status: Former Smoker    Quit date: 02/22/1995  . Smokeless tobacco: Never Used  . Alcohol Use: No  . Drug Use: No  . Sexual Activity: Not Currently    Other Topics Concern  . Not on file   Social History Narrative   Works at [employer].   Involves a lot of standing and bending.   Husband passed away in 15-Feb-2013 with leukemia.   Patient states that her daughter is age 89 (as of 62) and has bipolar. This daughter lives with her.   This daughter has a child who is 65 years old and another child he was 65 years old. They all live with the patient.   Patient also has a son who lives in [place].          Family History  Problem Relation Age of Onset  . Asthma Mother   . Mental illness Mother   . Cancer Father 68    lung cancer    Physical Exam: Blood pressure 126/88, pulse 68, temperature 97.9 F (36.6 C), temperature source Oral, resp. rate 18, weight 168 lb (76.204 kg)., Body mass index is 29.99 kg/(m^2). General: Well developed, well nourished,WF. Appears  in no acute distress. Neck: Supple. Trachea midline. No thyromegaly. Full ROM. No lymphadenopathy.No Carotid Bruits. Lungs: Clear to auscultation bilaterally without wheezes, rales, or rhonchi. Breathing is of normal effort and unlabored. Cardiovascular: RRR with S1 S2. No murmurs, rubs, or gallops. Distal pulses 2+ symmetrically. No carotid or abdominal bruits. Abdomen: Soft, non-tender, non-distended with normoactive bowel sounds. No hepatosplenomegaly or masses. No rebound/guarding. No CVA tenderness. No hernias.  Musculoskeletal: Full range of motion and 5/5 strength throughout.  Skin: Warm and moist without erythema, ecchymosis, wounds, or rash. Neuro: A+Ox3. CN II-XII grossly intact. Moves all extremities spontaneously. Full sensation throughout. Normal gait. DTR 2+ throughout upper and lower extremities. Finger to nose intact. Psych:  Responds to questions appropriately with a normal affect.   Assessment/Plan:  1. Anxiety She is now stable off of her Wellbutrin. Will continue to have clonazepam available if needed.  2. Depression This is stable and resolved and  is now off of Wellbutrin. Situational given her husband with cancer and death.  3. Hypertension Blood Pressure is at goal. Continue current medication. Check labs monitor. - COMPLETE METABOLIC PANEL WITH GFR  4. Hypothyroid - TSH  5. Vitamin D deficiency - Vit D  25 hydroxy (rtn osteoporosis monitoring)  6. Low back pain  7. Favorable lipid status. She had a complete physical exam and fasting labs 07/2013. Lipids were good with LDL 128 and all other parameters normal.  8. Preventive Care:  B. Pap- Per Gyn C. Mammogram: Per Gyn D. DEXA: Per Gyn E. screening colonoscopy: This was done December 2008. Normal. Repeat 10 years.HOWEVER, TODAY PT REPORTS THAT HER MOTHER HAS RECENTLY BEEN DIAGNOSED WITH COLON CANCER. I TOLD HER TO CALL HER GI OFFICE AND VERIFY WITH THEM, BUT I THINK SHE WILL NEED TO GO AHEAD AND HAVE REPEAT COLONOSCOPY (5 YEARS AFTER LAST ONE).  F. Immunizations: She s had her influenza vaccine 02/15/13. Tetanus vaccine: She says that she had a finger laceration 6 years ago this should have been updated at that time 02/16/07 Pneumovax we'll give it age 35 Zostavax  given age 37  6. Low back pain She had an  office visit with me on 06/27/13 at which time she reported pain from her left but not down to her left knee. She says that the pain resolved while on prednisone. However she reports average is the last few days she has been noticing some discomfort up toward the left but talked again. We'll obtain x-ray.  At prior OV we obtained XRay Has continued to use Flexeril and Celebrex as needed. Also has continued heat and stretching.   ROV 6 months, sooner if needed.  Signed, [address] [place], [place], [facility] 03/02/2014 9:16 AM

## 2014-03-02 NOTE — Telephone Encounter (Signed)
rx printed and fax to pharmacy

## 2014-03-03 LAB — VITAMIN D 25 HYDROXY (VIT D DEFICIENCY, FRACTURES): VIT D 25 HYDROXY: 45 ng/mL (ref 30–89)

## 2014-03-06 ENCOUNTER — Other Ambulatory Visit: Payer: Self-pay | Admitting: Physician Assistant

## 2014-03-06 DIAGNOSIS — I10 Essential (primary) hypertension: Secondary | ICD-10-CM

## 2014-03-07 NOTE — Telephone Encounter (Signed)
Medication refilled per protocol. 

## 2014-03-08 ENCOUNTER — Telehealth: Payer: Self-pay | Admitting: Family Medicine

## 2014-03-08 DIAGNOSIS — E039 Hypothyroidism, unspecified: Secondary | ICD-10-CM

## 2014-03-08 NOTE — Telephone Encounter (Signed)
Message copied by Olena Mater on Wed Mar 08, 2014 12:44 PM ------      Message from: Dena Billet      Created: Fri Mar 03, 2014 10:56 AM       Recent TSH levels have been up and down. Therefore, will not change dose now. Level currently barely abnormal. Tell her to cont current dose but go ahead and recheck TSH 3 MONTHS.      Vit D level is good--cont current dose.      Other labs normal.       Order Future TSH ------

## 2014-03-08 NOTE — Telephone Encounter (Signed)
Pt aware of lab results and provider recommendations.  TSH for 3 mth ordered

## 2014-04-12 ENCOUNTER — Ambulatory Visit (INDEPENDENT_AMBULATORY_CARE_PROVIDER_SITE_OTHER): Payer: 59 | Admitting: Physician Assistant

## 2014-04-12 ENCOUNTER — Encounter: Payer: Self-pay | Admitting: Physician Assistant

## 2014-04-12 VITALS — BP 126/82 | HR 92 | Temp 97.4°F | Resp 18 | Wt 168.0 lb

## 2014-04-12 DIAGNOSIS — L259 Unspecified contact dermatitis, unspecified cause: Secondary | ICD-10-CM

## 2014-04-12 DIAGNOSIS — L239 Allergic contact dermatitis, unspecified cause: Secondary | ICD-10-CM

## 2014-04-12 MED ORDER — METHYLPREDNISOLONE ACETATE 80 MG/ML IJ SUSP
80.0000 mg | Freq: Once | INTRAMUSCULAR | Status: AC
  Administered 2014-04-12: 80 mg via INTRAMUSCULAR

## 2014-04-12 NOTE — Progress Notes (Signed)
    Patient ID: Yvonne Wu MRN: [id number], DOB: [birth year], 58 y.o. Date of Encounter: 04/12/2014, 5:16 PM    Chief Complaint:  Chief Complaint  Patient presents with  . poison oak   HPI: 58 y.o. year old female says she has gotten into some poison oak or poison ivy. Says that this itchy rash started on her left arm on Sunday 04/09/14. Since then it has spread over her entire left arm and is now on her left leg.     Home Meds:   Outpatient Prescriptions Prior to Visit  Medication Sig Dispense Refill  . BENICAR 20 MG tablet TAKE ONE TABLET DAILY.  30 tablet  5  . Calcium Carbonate-Vitamin D (CALCIUM + D PO) Take 1 tablet by mouth 2 (two) times daily.      . celecoxib (CELEBREX) 200 MG capsule Take 1 capsule (200 mg total) by mouth daily.  30 capsule  5  . cholecalciferol (VITAMIN D) 1000 UNITS tablet Take 1,000 Units by mouth daily.      . clonazePAM (KLONOPIN) 0.5 MG tablet Take 1 tablet (0.5 mg total) by mouth 2 (two) times daily as needed for anxiety.  60 tablet  1  . cyclobenzaprine (FLEXERIL) 10 MG tablet Take 1 tablet (10 mg total) by mouth 3 (three) times daily as needed for muscle spasms.  30 tablet  2  . fluticasone (FLONASE) 50 MCG/ACT nasal spray USE 2 SPRAYS IN EACH NOSTRIL ONCE DAILY.  16 g  11  . SYNTHROID 75 MCG tablet Take 1 tablet (75 mcg total) by mouth daily before breakfast.  30 tablet  6   No facility-administered medications prior to visit.    Allergies: No Known Allergies    Review of Systems: See HPI for pertinent ROS. All other ROS negative.    Physical Exam: Blood pressure 126/82, pulse 92, temperature 97.4 F (36.3 C), temperature source Oral, resp. rate 18, weight 168 lb (76.204 kg)., Body mass index is 29.99 kg/(m^2). General: WNWD WF.  Appears in no acute distress. Neck: Supple. No thyromegaly. No lymphadenopathy. Lungs: Clear bilaterally to auscultation without wheezes, rales, or rhonchi. Breathing is unlabored. Heart: Regular rhythm. No  murmurs, rubs, or gallops. Msk:  Strength and tone normal for age. Extremities/Skin: Left arm with pink papules, some in linear distribution, large amounts towards the forearm.  Also scattered pink papules on the left leg. On the thigh and the lower leg. Neuro: Alert and oriented X 3. Moves all extremities spontaneously. Gait is normal. CNII-XII grossly in tact. Psych:  Responds to questions appropriately with a normal affect.     ASSESSMENT AND PLAN:  58 y.o. year old female with  1. Allergic dermatitis She prefers an injection versus  pills. Will give Depo-Medrol injection now. If rash does not resolve or if it recurs, then followup. - methylPREDNISolone acetate (DEPO-MEDROL) injection 80 mg; Inject 1 mL (80 mg total) into the muscle once.   [address] [place], [place], [facility] 04/12/2014 5:16 PM

## 2014-04-14 ENCOUNTER — Telehealth: Payer: Self-pay | Admitting: Family Medicine

## 2014-04-14 MED ORDER — PREDNISONE 20 MG PO TABS: 20.0000 mg | ORAL_TABLET | Freq: Every day | ORAL | Status: DC

## 2014-04-14 NOTE — Telephone Encounter (Signed)
Rash is still itching and is spreading onto legs.

## 2014-04-14 NOTE — Telephone Encounter (Signed)
Message copied by Olena Mater on Fri Apr 14, 2014 12:12 PM ------      Message from: Devoria Glassing      Created: Fri Apr 14, 2014  9:34 AM       Patient would like to talk with you about her poison ivy that she came in for this week and possibly getting a cream called in for it             [phone] ------

## 2014-04-14 NOTE — Telephone Encounter (Signed)
LM via VM to pt informing of prednisone and instructions on how to take. Rx sent in to [facility]

## 2014-04-14 NOTE — Telephone Encounter (Signed)
Send Rx For: Prednisone 20mg   Take 3 for 2 days then 2 for 2 days then 1 for 2 days # 12 + 0

## 2014-05-03 ENCOUNTER — Emergency Department (HOSPITAL_COMMUNITY)
Admission: EM | Admit: 2014-05-03 | Discharge: 2014-05-03 | Disposition: A | Discharge status: Home / Self Care | Payer: 59 | Attending: Emergency Medicine | Admitting: Emergency Medicine

## 2014-05-03 ENCOUNTER — Encounter (HOSPITAL_COMMUNITY): Payer: Self-pay | Admitting: Emergency Medicine

## 2014-05-03 DIAGNOSIS — I10 Essential (primary) hypertension: Secondary | ICD-10-CM | POA: Insufficient documentation

## 2014-05-03 DIAGNOSIS — Z791 Long term (current) use of non-steroidal anti-inflammatories (NSAID): Secondary | ICD-10-CM | POA: Insufficient documentation

## 2014-05-03 DIAGNOSIS — IMO0002 Reserved for concepts with insufficient information to code with codable children: Secondary | ICD-10-CM | POA: Insufficient documentation

## 2014-05-03 DIAGNOSIS — E039 Hypothyroidism, unspecified: Secondary | ICD-10-CM | POA: Insufficient documentation

## 2014-05-03 DIAGNOSIS — F411 Generalized anxiety disorder: Secondary | ICD-10-CM | POA: Insufficient documentation

## 2014-05-03 DIAGNOSIS — Z87891 Personal history of nicotine dependence: Secondary | ICD-10-CM | POA: Insufficient documentation

## 2014-05-03 DIAGNOSIS — N390 Urinary tract infection, site not specified: Secondary | ICD-10-CM | POA: Insufficient documentation

## 2014-05-03 DIAGNOSIS — F329 Major depressive disorder, single episode, unspecified: Secondary | ICD-10-CM | POA: Insufficient documentation

## 2014-05-03 DIAGNOSIS — F3289 Other specified depressive episodes: Secondary | ICD-10-CM | POA: Insufficient documentation

## 2014-05-03 DIAGNOSIS — Z79899 Other long term (current) drug therapy: Secondary | ICD-10-CM | POA: Insufficient documentation

## 2014-05-03 LAB — URINE MICROSCOPIC-ADD ON

## 2014-05-03 LAB — URINALYSIS, ROUTINE W REFLEX MICROSCOPIC
Bilirubin Urine: NEGATIVE
GLUCOSE, UA: NEGATIVE mg/dL
Ketones, ur: NEGATIVE mg/dL
NITRITE: NEGATIVE
PH: 5.5 (ref 5.0–8.0)
Protein, ur: 100 mg/dL — AB
Specific Gravity, Urine: 1.015 (ref 1.005–1.030)
Urobilinogen, UA: 1 mg/dL (ref 0.0–1.0)

## 2014-05-03 MED ORDER — PHENAZOPYRIDINE HCL 100 MG PO TABS
200.0000 mg | ORAL_TABLET | Freq: Once | ORAL | Status: AC
  Administered 2014-05-03: 200 mg via ORAL
  Filled 2014-05-03: qty 2

## 2014-05-03 MED ORDER — PHENAZOPYRIDINE HCL 200 MG PO TABS: 200.0000 mg | ORAL_TABLET | Freq: Three times a day (TID) | ORAL | Status: DC

## 2014-05-03 MED ORDER — SULFAMETHOXAZOLE-TMP DS 800-160 MG PO TABS
1.0000 | ORAL_TABLET | Freq: Once | ORAL | Status: AC
  Administered 2014-05-03: 1 via ORAL
  Filled 2014-05-03: qty 1

## 2014-05-03 MED ORDER — SULFAMETHOXAZOLE-TMP DS 800-160 MG PO TABS: 1.0000 | ORAL_TABLET | Freq: Two times a day (BID) | ORAL | Status: DC

## 2014-05-03 NOTE — ED Notes (Signed)
Patient c/o UTI symptoms since this afternoon.  Patient states she has noticed blood in her urine and it hurts to void.

## 2014-05-03 NOTE — ED Provider Notes (Signed)
CSN: [id number]     Arrival date & time 05/03/14  2110 History   First MD Initiated Contact with Patient 05/03/14 2218     Chief Complaint  Patient presents with  . Urinary Tract Infection     (Consider location/radiation/quality/duration/timing/severity/associated sxs/prior Treatment) Patient is a 58 y.o. female presenting with dysuria. The history is provided by the patient.  Dysuria Pain quality:  Burning and shooting Pain severity:  Moderate Onset quality:  Gradual Duration:  1 day Timing:  Constant Progression:  Worsening Chronicity:  New Recent urinary tract infections: no   Relieved by:  None tried Worsened by:  Nothing tried Ineffective treatments:  None tried Urinary symptoms: discolored urine, frequent urination, hematuria and hesitancy   Associated symptoms: no fever, no flank pain, no nausea, no vaginal discharge and no vomiting    Yvonne Wu is a 58 y.o. female who presents to the ED with frequency, urgency and pain with urination that started earlier today and has gotten worse. No sexually active in 2 years. No concerns about STD's.   Past Medical History  Diagnosis Date  . Anxiety   . Depression   . Hypertension   . Hypothyroid    Past Surgical History  Procedure Laterality Date  . Tubal ligation     Family History  Problem Relation Age of Onset  . Asthma Mother   . Mental illness Mother   . Cancer Father 39    lung cancer   History  Substance Use Topics  . Smoking status: Former Smoker    Quit date: 02/22/1995  . Smokeless tobacco: Never Used  . Alcohol Use: No   OB History   Grav Para Term Preterm Abortions TAB SAB Ect Mult Living                 Review of Systems  Constitutional: Negative for fever.  Gastrointestinal: Negative for nausea and vomiting.  Genitourinary: Positive for dysuria, urgency, frequency and hematuria. Negative for flank pain and vaginal discharge.  all other systems negative     Allergies  Review of patient's  allergies indicates no known allergies.  Home Medications   Prior to Admission medications   Medication Sig Start Date End Date Taking? Authorizing Provider  BENICAR 20 MG tablet TAKE ONE TABLET DAILY.    Orlena Sheldon, PA-C  Calcium Carbonate-Vitamin D (CALCIUM + D PO) Take 1 tablet by mouth 2 (two) times daily.    Historical Provider, MD  celecoxib (CELEBREX) 200 MG capsule Take 1 capsule (200 mg total) by mouth daily. 03/02/14   Orlena Sheldon, PA-C  cholecalciferol (VITAMIN D) 1000 UNITS tablet Take 1,000 Units by mouth daily.    Historical Provider, MD  clonazePAM (KLONOPIN) 0.5 MG tablet Take 1 tablet (0.5 mg total) by mouth 2 (two) times daily as needed for anxiety. 03/02/14   Lonie Peak Dixon, PA-C  cyclobenzaprine (FLEXERIL) 10 MG tablet Take 1 tablet (10 mg total) by mouth 3 (three) times daily as needed for muscle spasms. 03/02/14   Lonie Peak Dixon, PA-C  fluticasone (FLONASE) 50 MCG/ACT nasal spray USE 2 SPRAYS IN EACH NOSTRIL ONCE DAILY. 10/14/13   Lonie Peak Dixon, PA-C  predniSONE (DELTASONE) 20 MG tablet Take 1 tablet (20 mg total) by mouth daily with breakfast. Take 3 tablets for 2 days; then take 2 for 2 days; then 1 for 2 days 04/14/14   Orlena Sheldon, PA-C  SYNTHROID 75 MCG tablet Take 1 tablet (75 mcg total) by mouth daily  before breakfast. 12/07/13   Orlena Sheldon, PA-C   BP 152/89  Pulse 78  Temp(Src) 98 F (36.7 C) (Oral)  Resp 20  Ht 5' 5.5" (1.664 m)  Wt 163 lb (73.936 kg)  BMI 26.70 kg/m2  SpO2 100% Physical Exam  Nursing note and vitals reviewed. Constitutional: She is oriented to person, place, and time. She appears well-developed and well-nourished. No distress.  HENT:  Head: Normocephalic.  Eyes: EOM are normal.  Neck: Neck supple.  Cardiovascular: Normal rate.   Pulmonary/Chest: Effort normal.  Abdominal: Soft. There is tenderness in the suprapubic area. There is no CVA tenderness.  Musculoskeletal: Normal range of motion.  Neurological: She is alert and oriented to  person, place, and time. No cranial nerve deficit.  Skin: Skin is warm and dry.  Psychiatric: She has a normal mood and affect. Her behavior is normal.    ED Course  Procedures (including critical care time) Labs Review Results for orders placed during the hospital encounter of 05/03/14 (from the past 24 hour(s))  URINALYSIS, ROUTINE W REFLEX MICROSCOPIC     Status: Abnormal   Collection Time    05/03/14  9:51 PM      Result Value Ref Range   Color, Urine AMBER (*) YELLOW   APPearance CLEAR  CLEAR   Specific Gravity, Urine 1.015  1.005 - 1.030   pH 5.5  5.0 - 8.0   Glucose, UA NEGATIVE  NEGATIVE mg/dL   Hgb urine dipstick LARGE (*) NEGATIVE   Bilirubin Urine NEGATIVE  NEGATIVE   Ketones, ur NEGATIVE  NEGATIVE mg/dL   Protein, ur 100 (*) NEGATIVE mg/dL   Urobilinogen, UA 1.0  0.0 - 1.0 mg/dL   Nitrite NEGATIVE  NEGATIVE   Leukocytes, UA LARGE (*) NEGATIVE  URINE MICROSCOPIC-ADD ON     Status: Abnormal   Collection Time    05/03/14  9:51 PM      Result Value Ref Range   Squamous Epithelial / LPF FEW (*) RARE   WBC, UA 21-50  <3 WBC/hpf   RBC / HPF TOO NUMEROUS TO COUNT  <3 RBC/hpf   Bacteria, UA FEW (*) RARE     MDM  58 y.o. female with UTI symptoms. Will treat with antibiotics and Pyridium and she will follow up with her PCP. UC pending. Stable for discharge without fever or signs of sepsis. She will follow up with her PCP or return here as needed for problems.    Medication List    TAKE these medications       phenazopyridine 200 MG tablet  Commonly known as:  PYRIDIUM  Take 1 tablet (200 mg total) by mouth 3 (three) times daily.     sulfamethoxazole-trimethoprim 800-160 MG per tablet  Commonly known as:  BACTRIM DS  Take 1 tablet by mouth 2 (two) times daily.      ASK your doctor about these medications       CALCIUM + D PO  Take 1 tablet by mouth 2 (two) times daily.     celecoxib 200 MG capsule  Commonly known as:  CELEBREX  Take 1 capsule (200 mg total)  by mouth daily.     cholecalciferol 1000 UNITS tablet  Commonly known as:  VITAMIN D  Take 1,000 Units by mouth daily.     clonazePAM 0.5 MG tablet  Commonly known as:  KLONOPIN  Take 1 tablet (0.5 mg total) by mouth 2 (two) times daily as needed for anxiety.     cyclobenzaprine  10 MG tablet  Commonly known as:  FLEXERIL  Take 1 tablet (10 mg total) by mouth 3 (three) times daily as needed for muscle spasms.     olmesartan 20 MG tablet  Commonly known as:  BENICAR  Take 20 mg by mouth daily.     SYNTHROID 75 MCG tablet  Generic drug:  levothyroxine  Take 1 tablet (75 mcg total) by mouth daily before breakfast.           Ashley Murrain, NP 05/04/14 1403

## 2014-05-04 NOTE — ED Provider Notes (Signed)
Medical screening examination/treatment/procedure(s) were performed by non-physician practitioner and as supervising physician I was immediately available for consultation/collaboration.   EKG Interpretation None       Nat Christen, MD 05/04/14 [id number]

## 2014-05-07 LAB — URINE CULTURE

## 2014-05-08 ENCOUNTER — Telehealth (HOSPITAL_COMMUNITY): Payer: Self-pay

## 2014-05-08 NOTE — ED Notes (Signed)
Post ED Visit - Positive Culture Follow-up  Culture report reviewed by antimicrobial stewardship pharmacist: []  [place], Pharm.D., BCPS [x]  Heide Guile, Pharm.D., BCPS []  Alycia Rossetti, Pharm.D., BCPS []  [place], [place].D., BCPS, AAHIVP []  Legrand Como, Pharm.D., BCPS, AAHIVP []    Positive urine culture Treated with bactrim DS, organism sensitive to the same and no further patient follow-up is required at this time.  Ileene Musa 05/08/2014, 10:45 AM

## 2014-06-01 ENCOUNTER — Encounter: Payer: Self-pay | Admitting: Physician Assistant

## 2014-06-01 ENCOUNTER — Ambulatory Visit (INDEPENDENT_AMBULATORY_CARE_PROVIDER_SITE_OTHER): Payer: 59 | Admitting: Physician Assistant

## 2014-06-01 VITALS — BP 118/74 | HR 82 | Temp 97.5°F | Resp 18 | Ht 62.0 in | Wt 171.0 lb

## 2014-06-01 DIAGNOSIS — E039 Hypothyroidism, unspecified: Secondary | ICD-10-CM

## 2014-06-01 DIAGNOSIS — N390 Urinary tract infection, site not specified: Secondary | ICD-10-CM

## 2014-06-01 LAB — URINALYSIS, ROUTINE W REFLEX MICROSCOPIC
BILIRUBIN URINE: NEGATIVE
GLUCOSE, UA: NEGATIVE mg/dL
HGB URINE DIPSTICK: NEGATIVE
Ketones, ur: NEGATIVE mg/dL
Nitrite: POSITIVE — AB
PROTEIN: NEGATIVE mg/dL
Specific Gravity, Urine: <1.005 — ABNORMAL LOW (ref 1.005–1.030)
Urobilinogen, UA: 0.2 mg/dL (ref 0.0–1.0)
pH: 6.5 (ref 5.0–8.0)

## 2014-06-01 LAB — URINALYSIS, MICROSCOPIC ONLY
Casts: NONE SEEN
Crystals: NONE SEEN
RBC / HPF: NONE SEEN RBC/hpf (ref <3)

## 2014-06-01 LAB — TSH: TSH: 0.429 u[IU]/mL (ref 0.350–4.500)

## 2014-06-01 MED ORDER — CIPROFLOXACIN HCL 500 MG PO TABS: 500.0000 mg | ORAL_TABLET | Freq: Two times a day (BID) | ORAL | Status: DC

## 2014-06-01 NOTE — Progress Notes (Signed)
Patient ID: GEARLDEAN LOMANTO MRN: [id number], DOB: [birth year], 58 y.o. Date of Encounter: 06/01/2014, 1:17 PM    Chief Complaint:  Chief Complaint  Patient presents with  . Urinary Tract Infection     HPI: 58 y.o. year old female states that yesterday when she urinated she felt pain which felt the same as the symptoms she had with her recent UTI. Therefore wanted to come in before it gets worse. Has started over-the-counter AZO,  which is helping the symptoms. She has seen no hematuria. Has had no back pain and no fevers or chills.     Home Meds:   Outpatient Prescriptions Prior to Visit  Medication Sig Dispense Refill  . Calcium Carbonate-Vitamin D (CALCIUM + D PO) Take 1 tablet by mouth 2 (two) times daily.      . celecoxib (CELEBREX) 200 MG capsule Take 1 capsule (200 mg total) by mouth daily.  30 capsule  5  . cholecalciferol (VITAMIN D) 1000 UNITS tablet Take 1,000 Units by mouth daily.      . clonazePAM (KLONOPIN) 0.5 MG tablet Take 1 tablet (0.5 mg total) by mouth 2 (two) times daily as needed for anxiety.  60 tablet  1  . cyclobenzaprine (FLEXERIL) 10 MG tablet Take 1 tablet (10 mg total) by mouth 3 (three) times daily as needed for muscle spasms.  30 tablet  2  . olmesartan (BENICAR) 20 MG tablet Take 20 mg by mouth daily.      . phenazopyridine (PYRIDIUM) 200 MG tablet Take 1 tablet (200 mg total) by mouth 3 (three) times daily.  6 tablet  0  . sulfamethoxazole-trimethoprim (BACTRIM DS) 800-160 MG per tablet Take 1 tablet by mouth 2 (two) times daily.  14 tablet  0  . SYNTHROID 75 MCG tablet Take 1 tablet (75 mcg total) by mouth daily before breakfast.  30 tablet  6   No facility-administered medications prior to visit.    Allergies: No Known Allergies    Review of Systems: See HPI for pertinent ROS. All other ROS negative.    Physical Exam: Blood pressure 118/74, pulse 82, temperature 97.5 F (36.4 C), resp. rate 18, height 5\' 2"  (1.575 m), weight 171 lb (77.565  kg)., Body mass index is 31.27 kg/(m^2). General:  WNWD WF. Appears in no acute distress. Neck: Supple. No thyromegaly. No lymphadenopathy. Lungs: Clear bilaterally to auscultation without wheezes, rales, or rhonchi. Breathing is unlabored. Heart: Regular rhythm. No murmurs, rubs, or gallops. Abdomen: Soft,  non-distended with normoactive bowel sounds. No hepatomegaly. No rebound/guarding. No obvious abdominal masses. Minimal tenderness with palpation of suprapubic area.  Msk:  Strength and tone normal for age. No tenderness with percussion the costophrenic angles bilaterally. Extremities/Skin: Warm and dry.  Neuro: Alert and oriented X 3. Moves all extremities spontaneously. Gait is normal. CNII-XII grossly in tact. Psych:  Responds to questions appropriately with a normal affect.   Results for orders placed in visit on 06/01/14  URINALYSIS, ROUTINE W REFLEX MICROSCOPIC      Result Value Ref Range   Color, Urine YELLOW  YELLOW   APPearance CLEAR  CLEAR   Specific Gravity, Urine <1.005 (*) 1.005 - 1.030   pH 6.5  5.0 - 8.0   Glucose, UA NEG  NEG mg/dL   Bilirubin Urine NEG  NEG   Ketones, ur NEG  NEG mg/dL   Hgb urine dipstick NEG  NEG   Protein, ur NEG  NEG mg/dL   Urobilinogen, UA 0.2  0.0 -  1.0 mg/dL   Nitrite POS (*) NEG   Leukocytes, UA SMALL (*) NEG  URINALYSIS, MICROSCOPIC ONLY      Result Value Ref Range   Squamous Epithelial / LPF RARE  RARE   Crystals NONE SEEN  NONE SEEN   Casts NONE SEEN  NONE SEEN   WBC, UA 0-2  <3 WBC/hpf   RBC / HPF NONE SEEN  <3 RBC/hpf   Bacteria, UA FEW (*) RARE     ASSESSMENT AND PLAN:  58 y.o. year old female with  1. Urinary tract infection, site not specified - Urinalysis, Routine w reflex microscopic - ciprofloxacin (CIPRO) 500 MG tablet; Take 1 tablet (500 mg total) by mouth 2 (two) times daily.  Dispense: 10 tablet; Refill: 0 She is to take antibiotics as directed. If symptoms worsen or she develops any fever and she is to followup.  As well the symptoms do not resolve at completion of antibiotic and followup.  2. Unspecified hypothyroidism Patient says that when she had her last TSH checked she was told that it was slightly abnormal and was told to recheck in 3 months. Says that she thinks this is due to and wants to check this while she is here. I did check at her last TSH was in May and she is to repeat 3 months and repeat her TSH was already ordered. We'll get this done now. Call patient with results. - TSH   Signed, [facility] [place], [place], [facility] 06/01/2014 1:17 PM

## 2014-06-02 ENCOUNTER — Encounter: Payer: Self-pay | Admitting: *Deleted

## 2014-06-08 ENCOUNTER — Telehealth: Payer: Self-pay | Admitting: Physician Assistant

## 2014-06-08 DIAGNOSIS — N390 Urinary tract infection, site not specified: Secondary | ICD-10-CM

## 2014-06-08 MED ORDER — CIPROFLOXACIN HCL 500 MG PO TABS: 500.0000 mg | ORAL_TABLET | Freq: Two times a day (BID) | ORAL | Status: DC

## 2014-06-08 NOTE — Telephone Encounter (Signed)
Prescription sent to pharmacy.   Call placed to patient. LMTRC.  

## 2014-06-08 NOTE — Telephone Encounter (Signed)
Call returned to patient.   States that she was seen last week for UTI and was given Cipro for treatment. Reports that she has completed ABTx, but she now feels like burning and discomfort is returning.   Please advise.

## 2014-06-08 NOTE — Telephone Encounter (Signed)
Patient is calling to say that the uti that she had is still not gone, would like to know should she come back in or can we call in another antibiotic  [phone]

## 2014-06-08 NOTE — Telephone Encounter (Signed)
Explain to patient that we did a urine culture at that last visit and it was positive for UTI and showed that the infection should have been sensitive to Cipro. Tell her that we will send in 7 more days worth of Cipro. If symptoms worsen or do not resolve with completion of this round of Cipro, then she definitely needs to be seen for further evaluation. Send prescription for Cipro 500 mg 1 by mouth twice a day x7 days #14 with 0 refill

## 2014-06-09 NOTE — Telephone Encounter (Signed)
Patient returned call and made aware.

## 2014-06-14 ENCOUNTER — Other Ambulatory Visit: Payer: Self-pay | Admitting: Physician Assistant

## 2014-06-15 NOTE — Telephone Encounter (Signed)
Prescription sent to pharmacy.

## 2014-06-15 NOTE — Telephone Encounter (Signed)
Ok to refill??  Last office visit 06/01/2014.  Last refill 03/02/2014, #2 refills.

## 2014-06-15 NOTE — Telephone Encounter (Signed)
Refill approved for #30+2 additional refills

## 2014-07-17 ENCOUNTER — Other Ambulatory Visit: Payer: Self-pay | Admitting: Physician Assistant

## 2014-07-17 ENCOUNTER — Other Ambulatory Visit: Payer: Self-pay

## 2014-07-17 DIAGNOSIS — Z1231 Encounter for screening mammogram for malignant neoplasm of breast: Secondary | ICD-10-CM

## 2014-07-17 NOTE — Telephone Encounter (Signed)
Refill appropriate and filled per protocol. 

## 2014-07-21 ENCOUNTER — Ambulatory Visit
Admission: RE | Admit: 2014-07-21 | Discharge: 2014-07-21 | Disposition: A | Discharge status: Home / Self Care | Payer: 59 | Source: Ambulatory Visit

## 2014-07-21 DIAGNOSIS — Z1231 Encounter for screening mammogram for malignant neoplasm of breast: Secondary | ICD-10-CM

## 2014-09-12 ENCOUNTER — Other Ambulatory Visit: Payer: Self-pay | Admitting: Physician Assistant

## 2014-09-12 NOTE — Telephone Encounter (Signed)
Approved for #60+2 

## 2014-09-12 NOTE — Telephone Encounter (Signed)
Last anxiety visit 02/2014.  Last RF 02/2014 #60 + 1.  OK refill?

## 2014-09-13 NOTE — Telephone Encounter (Signed)
RX called in .

## 2014-09-15 ENCOUNTER — Other Ambulatory Visit: Payer: Self-pay | Admitting: Physician Assistant

## 2014-09-18 NOTE — Telephone Encounter (Signed)
Medication refilled per protocol. 

## 2014-09-27 ENCOUNTER — Ambulatory Visit: Payer: 59 | Admitting: Physician Assistant

## 2014-09-27 ENCOUNTER — Ambulatory Visit (INDEPENDENT_AMBULATORY_CARE_PROVIDER_SITE_OTHER): Payer: 59 | Admitting: Physician Assistant

## 2014-09-27 ENCOUNTER — Encounter: Payer: Self-pay | Admitting: Physician Assistant

## 2014-09-27 VITALS — BP 124/76 | HR 76 | Temp 98.0°F | Resp 18 | Wt 170.0 lb

## 2014-09-27 DIAGNOSIS — M6530 Trigger finger, unspecified finger: Secondary | ICD-10-CM

## 2014-09-28 NOTE — Progress Notes (Signed)
    Patient ID: Yvonne Wu MRN: [id number], DOB: 20-May-1956, 58 y.o. Date of Encounter: 09/28/2014, 7:30 AM    Chief Complaint:  Chief Complaint  Patient presents with  . problem with rt thumb    says keeps popping out of joint     HPI: 58 y.o. year old female says that sometime around Thanksgiving is when she noticed problems starting to develop with her right thumb. However she bends or extends the thumb there is a popping motion. It is as if it has gotten stuck and then pops to the next position. Has never had similar problems in the past.     Home Meds:   Outpatient Prescriptions Prior to Visit  Medication Sig Dispense Refill  . Calcium Carbonate-Vitamin D (CALCIUM + D PO) Take 1 tablet by mouth 2 (two) times daily.    . celecoxib (CELEBREX) 200 MG capsule TAKE (1) CAPSULE BY MOUTH ONCE DAILY. 30 capsule 2  . cholecalciferol (VITAMIN D) 1000 UNITS tablet Take 1,000 Units by mouth daily.    . clonazePAM (KLONOPIN) 0.5 MG tablet TAKE (1) TABLET BY MOUTH TWICE A DAY AS NEEDED. 60 tablet 2  . cyclobenzaprine (FLEXERIL) 10 MG tablet TAKE (1) TABLET BY MOUTH (3) TIMES DAILY AS NEEDED FOR MUSCLE SPASMS. 30 tablet 2  . olmesartan (BENICAR) 20 MG tablet Take 20 mg by mouth daily.    Marland Kitchen SYNTHROID 75 MCG tablet TAKE ONE TABLET BY MOUTH ONCE DAILY. 30 tablet 6  . ciprofloxacin (CIPRO) 500 MG tablet Take 1 tablet (500 mg total) by mouth 2 (two) times daily. 14 tablet 0  . phenazopyridine (PYRIDIUM) 200 MG tablet Take 1 tablet (200 mg total) by mouth 3 (three) times daily. 6 tablet 0  . sulfamethoxazole-trimethoprim (BACTRIM DS) 800-160 MG per tablet Take 1 tablet by mouth 2 (two) times daily. 14 tablet 0   No facility-administered medications prior to visit.    Allergies: No Known Allergies    Review of Systems: See HPI for pertinent ROS. All other ROS negative.    Physical Exam: Blood pressure 124/76, pulse 76, temperature 98 F (36.7 C), temperature source Oral, resp. rate 18,  weight 170 lb (77.111 kg)., Body mass index is 31.09 kg/(m^2). General:  WNWD WF. Appears in no acute distress. Neck: Supple. No thyromegaly. No lymphadenopathy. Lungs: Clear bilaterally to auscultation without wheezes, rales, or rhonchi. Breathing is unlabored. Heart: Regular rhythm. No murmurs, rubs, or gallops. Msk:  Strength and tone normal for age. Right Thumb: She flexes the thumb and bends the thumb, it gets stuck in that position and then finally pops into extension. Inspection is normal. Extremities/Skin: Warm and dry. Neuro: Alert and oriented X 3. Moves all extremities spontaneously. Gait is normal. CNII-XII grossly in tact. Psych:  Responds to questions appropriately with a normal affect.     ASSESSMENT AND PLAN:  58 y.o. year old female with  1. Trigger finger - Ambulatory referral to Orthopedic Surgery She requests to see Dr. Burney Gauze. I have made this comment in the referral section.  Signed, [address] [place], [place], [facility] 09/28/2014 7:30 AM

## 2014-10-21 ENCOUNTER — Other Ambulatory Visit: Payer: Self-pay | Admitting: Physician Assistant

## 2014-10-23 NOTE — Telephone Encounter (Signed)
Medication refilled per protocol. 

## 2014-10-24 ENCOUNTER — Other Ambulatory Visit: Payer: Self-pay | Admitting: Physician Assistant

## 2014-10-24 NOTE — Telephone Encounter (Signed)
Ok to refill??  Last office visit 09/27/2014.  Last refill 06/15/2014, #2 refills.

## 2014-10-24 NOTE — Telephone Encounter (Signed)
LRF 8/27 #30 + 2.  LOV 09/27/14  OK refill?

## 2014-10-25 NOTE — Telephone Encounter (Signed)
May increase quantity to  #60. Can  Still give 2 refills.

## 2014-10-25 NOTE — Telephone Encounter (Signed)
rx sent to pharmacy

## 2014-12-23 ENCOUNTER — Other Ambulatory Visit: Payer: Self-pay | Admitting: Physician Assistant

## 2014-12-25 ENCOUNTER — Encounter: Payer: Self-pay | Admitting: Family Medicine

## 2014-12-25 NOTE — Telephone Encounter (Signed)
Medication refilled per protocol. 

## 2015-01-03 ENCOUNTER — Encounter: Payer: Self-pay | Admitting: Physician Assistant

## 2015-01-03 ENCOUNTER — Ambulatory Visit (INDEPENDENT_AMBULATORY_CARE_PROVIDER_SITE_OTHER): Payer: 59 | Admitting: Physician Assistant

## 2015-01-03 VITALS — BP 132/88 | HR 60 | Temp 97.9°F | Resp 18 | Wt 171.0 lb

## 2015-01-03 DIAGNOSIS — M545 Low back pain: Secondary | ICD-10-CM | POA: Diagnosis not present

## 2015-01-03 DIAGNOSIS — E039 Hypothyroidism, unspecified: Secondary | ICD-10-CM | POA: Diagnosis not present

## 2015-01-03 DIAGNOSIS — M5432 Sciatica, left side: Secondary | ICD-10-CM

## 2015-01-03 DIAGNOSIS — I10 Essential (primary) hypertension: Secondary | ICD-10-CM | POA: Diagnosis not present

## 2015-01-03 DIAGNOSIS — F419 Anxiety disorder, unspecified: Secondary | ICD-10-CM | POA: Diagnosis not present

## 2015-01-03 LAB — BASIC METABOLIC PANEL WITH GFR
BUN: 10 mg/dL (ref 6–23)
CALCIUM: 9.3 mg/dL (ref 8.4–10.5)
CO2: 27 mEq/L (ref 19–32)
Chloride: 104 mEq/L (ref 96–112)
Creat: 0.77 mg/dL (ref 0.50–1.10)
GFR, Est African American: >89 mL/min
GFR, Est Non African American: 85 mL/min
GLUCOSE: 77 mg/dL (ref 70–99)
POTASSIUM: 4.2 meq/L (ref 3.5–5.3)
SODIUM: 141 meq/L (ref 135–145)

## 2015-01-03 LAB — TSH: TSH: 1.761 u[IU]/mL (ref 0.350–4.500)

## 2015-01-03 MED ORDER — PREDNISONE 20 MG PO TABS: ORAL_TABLET | ORAL | Status: DC

## 2015-01-03 NOTE — Progress Notes (Signed)
Patient ID: Yvonne Wu MRN: [id number], DOB: 10-15-1956, 59 y.o. Date of Encounter: 01/03/2015,   Chief Complaint: Routine followup of blood pressure, thyroid. Also having back pain.  HPI: 59 y.o. y/o white female  here for above.  (NOTE: I have been seeing her for years. She was having situational anxiety and depression because her husband had cancer--leukemia. He died of Leukemia Mar 05, 2013.)  At visit May 2015 she had told me that she had gone off of her Wellbutrin and was feeling fine without the medication in regards to anxiety and depression symptoms.  Also reported that she is rarely needing her clonazepam. Today she reports that all of this is remained the same. Says that she is feeling okay in regards to mood.  States that she is in the process of moving into a new house. Says that after her husband passed away she needed to down size and get rid of a lot of stuff.  Has built a new house near her mom. Her daughter and grandchildren live with patient. Pt states that her daughter is bipolar and that she "probably always will need to live with me "  She is taking blood pressure medication as directed with no adverse effects.  She is taking her thyroid medication as directed.  Regarding her back pain and sciatica--I reviewed her chart-- in the past she has had office visits me with me with these diagnoses on 06/27/13, 08/08/13, 03/02/14. She states that after that episode 03/02/14 she had no problems with back pain or sciatica until just the past one month.  Says that the symptoms just returned for about one month now. Points to the center of her left buttock area as the area of pain and then rubs her hand down towards her left lateral thigh and says that it radiates down towards that area. This is the only area of discomfort. Says that she mostly feels it if she has been sitting for a long time or if she lifts something and also at the end of the day after working all day, she will  feel it. Again says that this has not been a problem and has had no pain there since 03/02/14 until this past one month.  Review of Systems: Consitutional: No fever, chills, fatigue, night sweats, lymphadenopathy. No significant/unexplained weight changes. Eyes: No visual changes, eye redness, or discharge. ENT/Mouth: No ear pain, sore throat, nasal drainage, or sinus pain. Cardiovascular: No chest pressure,heaviness, tightness or squeezing, even with exertion. No increased shortness of breath or dyspnea on exertion.No palpitations, edema, orthopnea, PND. Respiratory: No cough, hemoptysis, SOB, or wheezing. Gastrointestinal: No anorexia, dysphagia, reflux, pain, nausea, vomiting, hematemesis, diarrhea, constipation, BRBPR, or melena. Breast: No mass, nodules, bulging, or retraction. No skin changes or inflammation. No nipple discharge. No lymphadenopathy. Genitourinary: No dysuria, hematuria, incontinence, vaginal discharge, pruritis, burning, abnormal bleeding, or pain. Musculoskeletal: No decreased ROM, No joint pain or swelling. No significant pain in neck. Skin: No rash, pruritis, or concerning lesions. Neurological: No headache, dizziness, syncope, seizures, tremors, memory loss, coordination problems, or paresthesias. Psychological: No anxiety, depression, hallucinations, SI/HI. Endocrine: No polydipsia, polyphagia, polyuria, or known diabetes.No increased fatigue. No palpitations/rapid heart rate. No significant/unexplained weight change. All other systems were reviewed and are otherwise negative.  Past Medical History  Diagnosis Date  . Anxiety   . Depression   . Hypertension   . Hypothyroid      Past Surgical History  Procedure Laterality Date  . Tubal ligation  Home Meds:  Current Outpatient Prescriptions on File Prior to Visit  Medication Sig Dispense Refill  . BENICAR 20 MG tablet TAKE ONE TABLET BY MOUTH ONCE DAILY. 30 tablet 3  . Calcium Carbonate-Vitamin D  (CALCIUM + D PO) Take 1 tablet by mouth 2 (two) times daily.    . celecoxib (CELEBREX) 200 MG capsule TAKE (1) CAPSULE BY MOUTH ONCE DAILY. 30 capsule 1  . cholecalciferol (VITAMIN D) 1000 UNITS tablet Take 1,000 Units by mouth daily.    . clonazePAM (KLONOPIN) 0.5 MG tablet TAKE (1) TABLET BY MOUTH TWICE A DAY AS NEEDED. 60 tablet 2  . cyclobenzaprine (FLEXERIL) 10 MG tablet TAKE (1) TABLET BY MOUTH (3) TIMES DAILY AS NEEDED FOR MUSCLE SPASMS. 60 tablet 2  . SYNTHROID 75 MCG tablet TAKE ONE TABLET BY MOUTH ONCE DAILY. 30 tablet 6   No current facility-administered medications on file prior to visit.    Allergies: No Known Allergies  History   Social History  . Marital Status: Widowed    Spouse Name: N/A  . Number of Children: N/A  . Years of Education: N/A   Occupational History  . Not on file.   Social History Main Topics  . Smoking status: Former Smoker    Quit date: 02/22/1995  . Smokeless tobacco: Never Used  . Alcohol Use: No  . Drug Use: No  . Sexual Activity: Not Currently   Other Topics Concern  . Not on file   Social History Narrative   Works at [employer].   Involves a lot of standing and bending.   Husband passed away in 06-Mar-2013 with leukemia.   Patient states that her daughter is age 80 (as of 39) and has bipolar. This daughter lives with her.   This daughter has a child who is 64 years old and another child he was 65 years old. They all live with the patient.   Patient also has a son who lives in [place].          Family History  Problem Relation Age of Onset  . Asthma Mother   . Mental illness Mother   . Cancer Father 20    lung cancer    Physical Exam: Blood pressure 132/88, pulse 60, temperature 97.9 F (36.6 C), temperature source Oral, resp. rate 18, weight 171 lb (77.565 kg)., Body mass index is 31.27 kg/(m^2). General: Well developed, well nourished,WF. Appears  in no acute distress. Neck: Supple. Trachea midline. No thyromegaly. Full  ROM. No lymphadenopathy.No Carotid Bruits. Lungs: Clear to auscultation bilaterally without wheezes, rales, or rhonchi. Breathing is of normal effort and unlabored. Cardiovascular: RRR with S1 S2. No murmurs, rubs, or gallops. Distal pulses 2+ symmetrically. No carotid or abdominal bruits. Abdomen: Soft, non-tender, non-distended with normoactive bowel sounds. No hepatosplenomegaly or masses. No rebound/guarding. No CVA tenderness. No hernias.  Musculoskeletal: Full range of motion and 5/5 strength throughout.  Minimal pain reproduced with palpation of left sciatic notch. no pain with palpation across the low back bilaterally. Straight leg raise and hip abduction normal.5,/5 strength in both legs. Skin: Warm and moist without erythema, ecchymosis, wounds, or rash. Neuro: A+Ox3. CN II-XII grossly intact. Moves all extremities spontaneously. Full sensation throughout. Normal gait.  Psych:  Responds to questions appropriately with a normal affect.   Assessment/Plan:  1. Anxiety She is now stable off of her Wellbutrin. Will continue to have clonazepam available if needed.  2. Depression This is stable and resolved and is now off of Wellbutrin. Situational  given her husband with cancer and death.  3. Hypertension Blood Pressure is at goal. Continue current medication. Check labs monitor. - COMPLETE METABOLIC PANEL WITH GFR  4. Hypothyroid - TSH  5. Vitamin D deficiency - Vit D  25 hydroxy (rtn osteoporosis monitoring)  6. Left Sciatica I reviewed chart and she has had office visits with me on the following dates regarding low back pain and sciatica:   06/27/13, 08/08/13, 03/02/14  She had x-ray of lumbar spine 08/08/13. It did show some degenerative disc changes as well as some scoliosis. Since she had no symptoms between May 2015 and this past one month, will just treat with prednisone taper right now. However told her to call me if symptoms do not  resolve with the prednisone taper or if  they resolve but then recur. If this occurs, then she may ultimately need to see a spine specialist for follow-up. Prescription sent for  prednisone taper from 60 mg 2 days then 40 for 2 days and 20 for 2 days  7. Favorable lipid status. She had a complete physical exam and fasting labs 07/2013. Lipids were good with LDL 128 and all other parameters normal.  8. Preventive Care:  B. Pap- Per Gyn C. Mammogram: Per Gyn D. DEXA: Per Gyn E. screening colonoscopy: This was done December 2008. Normal. Repeat 10 years. F. Immunizations: Influenza---N/A today Tetanus vaccine: She says that she had a finger laceration 6 years ago this should have been updated at that time 2008 Pneumovax we'll give it age 74. He does not smoke. She has no indication to need this until age 56. Zostavax --discuss at  age 53   ROV 6 months, sooner if needed.  [address] [place], [place], [facility] 01/03/2015 1:46 PM

## 2015-01-04 ENCOUNTER — Encounter: Payer: Self-pay | Admitting: *Deleted

## 2015-02-02 ENCOUNTER — Other Ambulatory Visit: Payer: Self-pay | Admitting: Obstetrics & Gynecology

## 2015-02-10 ENCOUNTER — Other Ambulatory Visit: Payer: Self-pay | Admitting: Physician Assistant

## 2015-02-12 NOTE — Telephone Encounter (Signed)
Medication refilled per protocol. 

## 2015-02-13 ENCOUNTER — Encounter (HOSPITAL_COMMUNITY)
Admission: RE | Admit: 2015-02-13 | Discharge: 2015-02-13 | Disposition: A | Discharge status: Home / Self Care | Payer: 59 | Source: Ambulatory Visit | Attending: Obstetrics & Gynecology | Admitting: Obstetrics & Gynecology

## 2015-02-13 ENCOUNTER — Other Ambulatory Visit: Payer: Self-pay

## 2015-02-13 ENCOUNTER — Encounter (HOSPITAL_COMMUNITY): Payer: Self-pay

## 2015-02-13 DIAGNOSIS — M5135 Other intervertebral disc degeneration, thoracolumbar region: Secondary | ICD-10-CM | POA: Diagnosis not present

## 2015-02-13 DIAGNOSIS — F419 Anxiety disorder, unspecified: Secondary | ICD-10-CM | POA: Diagnosis not present

## 2015-02-13 DIAGNOSIS — N3641 Hypermobility of urethra: Secondary | ICD-10-CM | POA: Diagnosis not present

## 2015-02-13 DIAGNOSIS — N393 Stress incontinence (female) (male): Secondary | ICD-10-CM | POA: Diagnosis present

## 2015-02-13 DIAGNOSIS — Z87891 Personal history of nicotine dependence: Secondary | ICD-10-CM | POA: Diagnosis not present

## 2015-02-13 DIAGNOSIS — E039 Hypothyroidism, unspecified: Secondary | ICD-10-CM | POA: Diagnosis not present

## 2015-02-13 DIAGNOSIS — F329 Major depressive disorder, single episode, unspecified: Secondary | ICD-10-CM | POA: Diagnosis not present

## 2015-02-13 DIAGNOSIS — N8110 Cystocele, unspecified: Secondary | ICD-10-CM | POA: Diagnosis present

## 2015-02-13 DIAGNOSIS — I10 Essential (primary) hypertension: Secondary | ICD-10-CM | POA: Diagnosis not present

## 2015-02-13 LAB — CBC
HEMATOCRIT: 36.7 % (ref 36.0–46.0)
Hemoglobin: 12.1 g/dL (ref 12.0–15.0)
MCH: 30 pg (ref 26.0–34.0)
MCHC: 33 g/dL (ref 30.0–36.0)
MCV: 90.8 fL (ref 78.0–100.0)
PLATELETS: 284 10*3/uL (ref 150–400)
RBC: 4.04 MIL/uL (ref 3.87–5.11)
RDW: 13.2 % (ref 11.5–15.5)
WBC: 4.8 10*3/uL (ref 4.0–10.5)

## 2015-02-13 LAB — BASIC METABOLIC PANEL
Anion gap: 6 (ref 5–15)
BUN: 12 mg/dL (ref 6–23)
CO2: 29 mmol/L (ref 19–32)
CREATININE: 0.79 mg/dL (ref 0.50–1.10)
Calcium: 9.1 mg/dL (ref 8.4–10.5)
Chloride: 106 mmol/L (ref 96–112)
GFR calc non Af Amer: >90 mL/min (ref >90)
GLUCOSE: 93 mg/dL (ref 70–99)
Potassium: 3.7 mmol/L (ref 3.5–5.1)
Sodium: 141 mmol/L (ref 135–145)

## 2015-02-13 NOTE — Patient Instructions (Addendum)
Your procedure is scheduled on:02/15/15  Enter through the Main Entrance at : 10 am Pick up desk phone and dial [phone] and inform us of your arrival.  Please call [phone] if you have any problems the morning of surgery.  Remember: Do not eat food or drink liquids, including water, after midnight: Water ok until: 7am Thursday   You may brush your teeth the morning of surgery.  Take these meds the morning of surgery with a sip of water:Synthroid  DO NOT wear jewelry, eye make-up, lipstick,body lotion, or dark fingernail polish.  (Polished toes are ok) You may wear deodorant.  If you are to be admitted after surgery, leave suitcase in car until your room has been assigned. Patients discharged on the day of surgery will not be allowed to drive home. Wear loose fitting, comfortable clothes for your ride home.

## 2015-02-14 NOTE — Anesthesia Preprocedure Evaluation (Addendum)
Anesthesia Evaluation  Patient identified by MRN, date of birth, ID band Patient awake    Reviewed: Allergy & Precautions, NPO status , Patient's Chart, lab work & pertinent test results  History of Anesthesia Complications Negative for: history of anesthetic complications  Airway Mallampati: II  TM Distance: >3 FB Neck ROM: Full    Dental no notable dental hx. (+) Dental Advisory Given   Pulmonary former smoker,  breath sounds clear to auscultation  Pulmonary exam normal       Cardiovascular hypertension, Pt. on medications Rhythm:Regular Rate:Normal     Neuro/Psych PSYCHIATRIC DISORDERS Anxiety Depression negative neurological ROS     GI/Hepatic negative GI ROS, Neg liver ROS,   Endo/Other  Hypothyroidism   Renal/GU negative Renal ROS  negative genitourinary   Musculoskeletal L sciatica DDD   Abdominal   Peds negative pediatric ROS (+)  Hematology negative hematology ROS (+)   Anesthesia Other Findings   Reproductive/Obstetrics negative OB ROS                            Anesthesia Physical Anesthesia Plan  ASA: II  Anesthesia Plan: General   Post-op Pain Management:    Induction: Intravenous  Airway Management Planned: Oral ETT  Additional Equipment:   Intra-op Plan:   Post-operative Plan: Extubation in OR  Informed Consent: I have reviewed the patients History and Physical, chart, labs and discussed the procedure including the risks, benefits and alternatives for the proposed anesthesia with the patient or authorized representative who has indicated his/her understanding and acceptance.   Dental advisory given  Plan Discussed with: CRNA, Anesthesiologist and Surgeon  Anesthesia Plan Comments:        Anesthesia Quick Evaluation

## 2015-02-15 ENCOUNTER — Ambulatory Visit (HOSPITAL_COMMUNITY): Payer: 59 | Admitting: Anesthesiology

## 2015-02-15 ENCOUNTER — Encounter (HOSPITAL_COMMUNITY)
Admission: RE | Disposition: A | Discharge status: Home / Self Care | Payer: Self-pay | Source: Ambulatory Visit | Attending: Obstetrics & Gynecology

## 2015-02-15 ENCOUNTER — Ambulatory Visit (HOSPITAL_COMMUNITY)
Admission: RE | Admit: 2015-02-15 | Discharge: 2015-02-15 | Disposition: A | Discharge status: Home / Self Care | Payer: 59 | Source: Ambulatory Visit | Attending: Obstetrics & Gynecology | Admitting: Obstetrics & Gynecology

## 2015-02-15 ENCOUNTER — Encounter (HOSPITAL_COMMUNITY): Payer: Self-pay

## 2015-02-15 DIAGNOSIS — F329 Major depressive disorder, single episode, unspecified: Secondary | ICD-10-CM | POA: Insufficient documentation

## 2015-02-15 DIAGNOSIS — N3641 Hypermobility of urethra: Secondary | ICD-10-CM | POA: Diagnosis not present

## 2015-02-15 DIAGNOSIS — I10 Essential (primary) hypertension: Secondary | ICD-10-CM | POA: Insufficient documentation

## 2015-02-15 DIAGNOSIS — M5135 Other intervertebral disc degeneration, thoracolumbar region: Secondary | ICD-10-CM | POA: Insufficient documentation

## 2015-02-15 DIAGNOSIS — Z87891 Personal history of nicotine dependence: Secondary | ICD-10-CM | POA: Insufficient documentation

## 2015-02-15 DIAGNOSIS — F419 Anxiety disorder, unspecified: Secondary | ICD-10-CM | POA: Insufficient documentation

## 2015-02-15 DIAGNOSIS — E039 Hypothyroidism, unspecified: Secondary | ICD-10-CM | POA: Insufficient documentation

## 2015-02-15 DIAGNOSIS — N8110 Cystocele, unspecified: Secondary | ICD-10-CM | POA: Insufficient documentation

## 2015-02-15 DIAGNOSIS — IMO0002 Reserved for concepts with insufficient information to code with codable children: Secondary | ICD-10-CM | POA: Diagnosis present

## 2015-02-15 DIAGNOSIS — N393 Stress incontinence (female) (male): Secondary | ICD-10-CM

## 2015-02-15 HISTORY — PX: CYSTOCELE REPAIR: SHX163

## 2015-02-15 HISTORY — DX: Stress incontinence (female) (male): N39.3

## 2015-02-15 HISTORY — PX: BLADDER SUSPENSION: SHX72

## 2015-02-15 HISTORY — PX: CYSTOSCOPY: SHX5120

## 2015-02-15 SURGERY — TRANSVAGINAL TAPE (TVT) PROCEDURE
Anesthesia: General | Site: Vagina

## 2015-02-15 MED ORDER — OXYCODONE-ACETAMINOPHEN 5-325 MG PO TABS: 1.0000 | ORAL_TABLET | ORAL | Status: DC | PRN

## 2015-02-15 MED ORDER — MIDAZOLAM HCL 2 MG/2ML IJ SOLN
INTRAMUSCULAR | Status: DC | PRN
  Administered 2015-02-15: 2 mg via INTRAVENOUS

## 2015-02-15 MED ORDER — SODIUM CHLORIDE 0.9 % IJ SOLN
INTRAMUSCULAR | Status: AC
  Filled 2015-02-15: qty 10

## 2015-02-15 MED ORDER — PROPOFOL 10 MG/ML IV BOLUS
INTRAVENOUS | Status: AC
  Filled 2015-02-15: qty 20

## 2015-02-15 MED ORDER — FENTANYL CITRATE (PF) 100 MCG/2ML IJ SOLN: 25.0000 ug | INTRAMUSCULAR | Status: DC | PRN

## 2015-02-15 MED ORDER — ROCURONIUM BROMIDE 100 MG/10ML IV SOLN
INTRAVENOUS | Status: DC | PRN
  Administered 2015-02-15: 35 mg via INTRAVENOUS

## 2015-02-15 MED ORDER — SCOPOLAMINE 1 MG/3DAYS TD PT72
1.0000 | MEDICATED_PATCH | Freq: Once | TRANSDERMAL | Status: DC
  Administered 2015-02-15: 1.5 mg via TRANSDERMAL

## 2015-02-15 MED ORDER — MIDAZOLAM HCL 2 MG/2ML IJ SOLN
INTRAMUSCULAR | Status: AC
  Filled 2015-02-15: qty 2

## 2015-02-15 MED ORDER — LIDOCAINE HCL (CARDIAC) 20 MG/ML IV SOLN
INTRAVENOUS | Status: AC
  Filled 2015-02-15: qty 5

## 2015-02-15 MED ORDER — DEXAMETHASONE SODIUM PHOSPHATE 10 MG/ML IJ SOLN
INTRAMUSCULAR | Status: DC | PRN
  Administered 2015-02-15: 4 mg via INTRAVENOUS

## 2015-02-15 MED ORDER — IBUPROFEN 200 MG PO TABS: 600.0000 mg | ORAL_TABLET | Freq: Four times a day (QID) | ORAL | Status: DC | PRN

## 2015-02-15 MED ORDER — SODIUM CHLORIDE 0.9 % IJ SOLN
INTRAMUSCULAR | Status: AC
  Filled 2015-02-15: qty 50

## 2015-02-15 MED ORDER — SCOPOLAMINE 1 MG/3DAYS TD PT72
MEDICATED_PATCH | TRANSDERMAL | Status: AC
  Administered 2015-02-15: 1.5 mg via TRANSDERMAL
  Filled 2015-02-15: qty 1

## 2015-02-15 MED ORDER — ONDANSETRON HCL 4 MG/2ML IJ SOLN: 4.0000 mg | Freq: Once | INTRAMUSCULAR | Status: DC | PRN

## 2015-02-15 MED ORDER — CEFAZOLIN SODIUM-DEXTROSE 2-3 GM-% IV SOLR
INTRAVENOUS | Status: DC | PRN
  Administered 2015-02-15: 2 g via INTRAVENOUS

## 2015-02-15 MED ORDER — PROPOFOL 10 MG/ML IV BOLUS
INTRAVENOUS | Status: DC | PRN
  Administered 2015-02-15: 200 mg via INTRAVENOUS

## 2015-02-15 MED ORDER — ONDANSETRON HCL 4 MG/2ML IJ SOLN
INTRAMUSCULAR | Status: DC | PRN
  Administered 2015-02-15: 4 mg via INTRAVENOUS

## 2015-02-15 MED ORDER — FENTANYL CITRATE (PF) 250 MCG/5ML IJ SOLN
INTRAMUSCULAR | Status: AC
  Filled 2015-02-15: qty 5

## 2015-02-15 MED ORDER — LIDOCAINE HCL (CARDIAC) 20 MG/ML IV SOLN
INTRAVENOUS | Status: DC | PRN
Start: 2015-02-15 — End: 2015-02-15
  Administered 2015-02-15: 40 mg via INTRAVENOUS

## 2015-02-15 MED ORDER — FENTANYL CITRATE (PF) 100 MCG/2ML IJ SOLN
INTRAMUSCULAR | Status: DC | PRN
  Administered 2015-02-15: 100 ug via INTRAVENOUS
  Administered 2015-02-15 (×3): 50 ug via INTRAVENOUS

## 2015-02-15 MED ORDER — ONDANSETRON HCL 4 MG/2ML IJ SOLN
INTRAMUSCULAR | Status: AC
  Filled 2015-02-15: qty 2

## 2015-02-15 MED ORDER — VASOPRESSIN 20 UNIT/ML IV SOLN
INTRAVENOUS | Status: DC | PRN
  Administered 2015-02-15: 20 mL via INTRAMUSCULAR

## 2015-02-15 MED ORDER — DEXAMETHASONE SODIUM PHOSPHATE 4 MG/ML IJ SOLN
INTRAMUSCULAR | Status: AC
  Filled 2015-02-15: qty 1

## 2015-02-15 MED ORDER — ESTRADIOL 0.1 MG/GM VA CREA
TOPICAL_CREAM | VAGINAL | Status: AC
  Filled 2015-02-15: qty 42.5

## 2015-02-15 MED ORDER — LACTATED RINGERS IV SOLN
INTRAVENOUS | Status: DC
  Administered 2015-02-15: 10 mL/h via INTRAVENOUS
  Administered 2015-02-15 (×2): via INTRAVENOUS

## 2015-02-15 MED ORDER — STERILE WATER FOR IRRIGATION IR SOLN
Status: DC | PRN
  Administered 2015-02-15: 1000 mL

## 2015-02-15 MED ORDER — VASOPRESSIN 20 UNIT/ML IV SOLN
INTRAVENOUS | Status: AC
  Filled 2015-02-15: qty 1

## 2015-02-15 MED ORDER — ROCURONIUM BROMIDE 100 MG/10ML IV SOLN
INTRAVENOUS | Status: AC
  Filled 2015-02-15: qty 1

## 2015-02-15 SURGICAL SUPPLY — 34 items
BAG URINE DRAINAGE (UROLOGICAL SUPPLIES) ×3 IMPLANT
BLADE SURG 15 STRL LF C SS BP (BLADE) ×4 IMPLANT
BLADE SURG 15 STRL SS (BLADE) ×2
CANISTER SUCT 3000ML (MISCELLANEOUS) ×3 IMPLANT
CATH FOLEY 2WAY SLVR  5CC 16FR (CATHETERS) ×1
CATH FOLEY 2WAY SLVR  5CC 18FR (CATHETERS) ×1
CATH FOLEY 2WAY SLVR 5CC 16FR (CATHETERS) ×2 IMPLANT
CATH FOLEY 2WAY SLVR 5CC 18FR (CATHETERS) ×2 IMPLANT
CLOTH BEACON ORANGE TIMEOUT ST (SAFETY) ×3 IMPLANT
CONT PATH 16OZ SNAP LID 3702 (MISCELLANEOUS) IMPLANT
DECANTER SPIKE VIAL GLASS SM (MISCELLANEOUS) ×6 IMPLANT
GAUZE PACKING 1 X5 YD ST (GAUZE/BANDAGES/DRESSINGS) ×3 IMPLANT
GLOVE BIO SURGEON STRL SZ7 (GLOVE) ×3 IMPLANT
GLOVE BIOGEL PI IND STRL 7.0 (GLOVE) ×2 IMPLANT
GLOVE BIOGEL PI INDICATOR 7.0 (GLOVE) ×1
GOWN STRL REUS W/TWL LRG LVL3 (GOWN DISPOSABLE) ×12 IMPLANT
LIQUID BAND (GAUZE/BANDAGES/DRESSINGS) ×3 IMPLANT
NEEDLE HYPO 22GX1.5 SAFETY (NEEDLE) ×3 IMPLANT
NS IRRIG 1000ML POUR BTL (IV SOLUTION) ×3 IMPLANT
PACK VAGINAL WOMENS (CUSTOM PROCEDURE TRAY) ×3 IMPLANT
RETRACTOR STAY HOOK 5MM (MISCELLANEOUS) IMPLANT
SET CYSTO W/LG BORE CLAMP LF (SET/KITS/TRAYS/PACK) ×3 IMPLANT
SLING TVT EXACT (Sling) ×3 IMPLANT
SUT CHROMIC 0 CT 1 (SUTURE) IMPLANT
SUT CHROMIC 2 0 SH (SUTURE) ×9 IMPLANT
SUT VIC AB 0 CT1 27 (SUTURE) ×1
SUT VIC AB 0 CT1 27XBRD ANBCTR (SUTURE) ×2 IMPLANT
SUT VIC AB 2-0 SH 27 (SUTURE) ×2
SUT VIC AB 2-0 SH 27XBRD (SUTURE) ×4 IMPLANT
SUT VIC AB 3-0 SH 27 (SUTURE) ×2
SUT VIC AB 3-0 SH 27X BRD (SUTURE) ×4 IMPLANT
TOWEL OR 17X24 6PK STRL BLUE (TOWEL DISPOSABLE) ×6 IMPLANT
TRAY FOLEY CATH SILVER 14FR (SET/KITS/TRAYS/PACK) ×3 IMPLANT
WATER STERILE IRR 1000ML POUR (IV SOLUTION) ×3 IMPLANT

## 2015-02-15 NOTE — Transfer of Care (Signed)
Immediate Anesthesia Transfer of Care Note  Patient: Yvonne Wu  Procedure(s) Performed: Procedure(s) with comments: TRANSVAGINAL TAPE (TVT) Mid-Urethral Sling PROCEDURE (N/A) - 2 hrs. CYSTOSCOPY (N/A) ANTERIOR REPAIR (CYSTOCELE)/Colporrhaphy (N/A)  Patient Location: PACU  Anesthesia Type:General  Level of Consciousness: awake, alert  and oriented  Airway & Oxygen Therapy: Patient Spontanous Breathing and Patient connected to nasal cannula oxygen  Post-op Assessment: Report given to RN and Post -op Vital signs reviewed and stable  Post vital signs: Reviewed and stable  Last Vitals:  Filed Vitals:   02/15/15 1231  BP: 121/63  Pulse: 100  Temp: 36.3 C  Resp: 16    Complications: No apparent anesthesia complications

## 2015-02-15 NOTE — Anesthesia Postprocedure Evaluation (Signed)
  Anesthesia Post-op Note  Patient: Yvonne Wu  Procedure(s) Performed: Procedure(s) with comments: TRANSVAGINAL TAPE (TVT) Mid-Urethral Sling PROCEDURE (N/A) - 2 hrs. CYSTOSCOPY (N/A) ANTERIOR REPAIR (CYSTOCELE)/Colporrhaphy (N/A)  Patient Location: PACU  Anesthesia Type:General  Level of Consciousness: awake, alert  and oriented  Airway and Oxygen Therapy: Patient Spontanous Breathing  Post-op Pain: mild  Post-op Assessment: Post-op Vital signs reviewed, Patient's Cardiovascular Status Stable, Respiratory Function Stable, Patent Airway, No signs of Nausea or vomiting and Pain level controlled  Post-op Vital Signs: Reviewed and stable  Last Vitals:  Filed Vitals:   02/15/15 1400  BP: 115/66  Pulse: 84  Temp: 36.4 C  Resp: 11    Complications: No apparent anesthesia complications

## 2015-02-15 NOTE — Op Note (Signed)
Preoperative diagnosis: Cystocele grade 3, Stress urinary incontinence, urethral hypermobility  Postoperative diagnosis: Same Procedure: Anterior Colporrhaphy and Tension-free vaginal tape- Retropubic Sling (TVT- Exact), Cystoscopy Surgeon: Azucena Fallen, MD Assistant: Princess Bruins, CNM Anesthesia: General, via LMA Antibiotics 2 g Ancef Complications: None Estimated blood loss: 30 cc Findings: Grade 3 cystocele, grade 1 uterine prolapse and hypermobile urethra. Normal vaginal mucosa. Normal cystoscopy findings with no bladder perforation, normal bilateral efflux of urine from both ureters.   Indications: 59 yo female with grade 3 Cystocele and Genuine stress urinary incontinence. Surgery discussed, including procedure, the risk and benefits of this procedure including the risk of recurrence of prolapse and incontinence, post-op urinary retention (and possibly needing to go home with a catheter), risk of sling erosion, immediate surgical and anaesthesia risks,  and possibly worsening or causing de-novo urge incontinence.   Procedure:  After informed consent was obtained from the patient she was taken to the operating room where general anesthesia was initiated without difficulty. Time out was carried out. Mons pubis exit sites for TVT were shaved slightly and marked exit points, 2 cm lateral to the midline just above the a pubic symphysis on both sides.  A solution of 20 units Pitressin in 60 cc of normal saline was used as the injection. 20 cc of this solution was injected into the suprapubic space bilaterally with spinal needle from the planned abdominal exit points behind the pubic symphysis and into the retropubic space. This space was confirmed with the use of the hand in the vagina to feel the needle tip prior to aspirating and injecting. A Foley catheter was then placed to drain the bladder the balloon was inflated and used to help identify the bladder and to plan the sling placement in the  mid urethral position.  Cystocele repair was performed first-   Cystocele was grade 3. Uterine prolapse at best grade 1 when pulled with tenaculum. Foley catheter was introduced. Allis clamp applied in the midline 2 cm below the urethral meatus at mid urethral area. 2 Allis clamps applied at the inferior edge of the cystocele 2 cm away from the cervix. Infiltration for TVT exit sites, lateral tracts and Cystocele done using 20u Vasopressin in 60 cc of saline. Inverted T incision made with #15 blade on vaginal wall over the cystocele. Blunt and sharp dissection performed to separate endopelvic fascia and cystocele sac from vaginal epithelium. Torn endopelvic fascia was identified and dissected under close to the White line laterally. Fascial edges were grasped from both sides and pulled together to assess closure. Now cystocele was reduced with interrupted 2-0 Vicryl sutures but using endopelvic fascia and suturing it together in the midline. Cystocele was well reduced. Excess vaginal epithelium was cut and fresh edges were sutured in the midline grasping sutured endopelvic fascia in the midline. Hemostasis was excellent.   Now TVT sling was performed-  Retropubic exit sites were incised with 15#  blade 3 finger breaths apart in the midline. Two Allis clamps placed 2 cm below the urethral meatus in the area of mid urethra on either side of the midline.  A #15 blade was used to incise between the Allis clamps and with Metzenbaum scissors vaginal mucosa dissected to creat tunnels toward retropubic space under the pubic ramus in the direction of ipsilateral shoulder. Once the planned entry routes were dissected a rigid Foley guide was placed through the foley into the bladder. Bladder neck was deviated to the left by moving the guide to patient's right  thigh and right retropubic space was targeted. TVT sling plastic sleeve was mounted on blunt 57mm trocar and was inserted into the pre-dissected space and was pushed  under the pubic bone, and while hugging the bone and dropping the trocar handle towards the ground it was directed to the abdominal wall, bringing it out through the premarked right exit incision. A Kelly clamp was placed on the plastic sleeve tip and the trocar was removed through the vaginal incision. The trocar was placed in the opposite TVT sling sleeve. Bladder neck was deviated to right by moving the foley with guide to the left thigh and opening up left retropubic space and the trocar was inserted into the left pre-dissected space and was pushed under the pubic bone, and while hugging the bone and dropping the trocar handle towards the ground it was directed to the abdominal wall, bringing it out through the premarked left exit incision. Kelly clamp placed on plastic sleeve and metal trocar removed.  Cystoscopy was performed with saline for irrigation. Bladder was filled to 400 cc and evaluation was done of the entire bladder mucosa and the urethra to ensure no sling material was eroding through the mucosa. FIndings were clear. Bilateral urine efflux noted from ureteral openings, no evidence of bladder injury noted. Cystoscope removed and bladder emptied with foley.  Sling was pulled from both exit sites while leave a Mayo scissors between sling and vaginal space to ensure "tension-free" placement. Plastic covers on sling were removed by gentle pull and excess sling cut at the abdominal exit sites. Good hemostasis was noted. The vaginal mucosa was closed with 2-0 vicryl.  Vaginal pack inserted. Foley was left in placed. The patient tolerated the procedure well. Sponge, lap and needle counts were correct x2 and the patient was taken to the recovery room in stable condition.  Vaginal pack will be removed in PACU in 1.1/2 hrs and bladder voiding trial will be performed there. I performed the entire surgery. Dr Dellis Filbert assisted Cystocele repair.   V., MD

## 2015-02-15 NOTE — H&P (Addendum)
Yvonne Wu is an 59 y.o. female here for cystocele and stress urinary incontinence procedure. Patient has noted vaginal bulge for few years and now getting more discomfort. Declined vaginal pessary and wants surgery. She has norma bladder function per office CMG.  Menopausal, no bleeding. No HRT. Normal Pap hx. Normal mammograms.  No LMP recorded. Patient is postmenopausal.    Past Medical History  Diagnosis Date  . Anxiety   . Hypertension   . Hypothyroid   . Depression     situational    Past Surgical History  Procedure Laterality Date  . Tubal ligation    . Bunionectomy Left     Family History  Problem Relation Age of Onset  . Asthma Mother   . Mental illness Mother   . Cancer Father 9    lung cancer    Social History:  reports that she quit smoking about 19 years ago. She has never used smokeless tobacco. She reports that she does not drink alcohol or use illicit drugs.  Allergies: No Known Allergies  No prescriptions prior to admission    ROS  No dysuria/hematuria. No vaginal bleeding.    There were no vitals taken for this visit. Physical Exam   A&O x 3, no acute distress. Pleasant HEENT neg, no thyromegaly Lungs CTA bilat CV RRR, S1S2 normal Abdo soft, non tender, non acute Extr no edema/ tenderness Pelvic Grade 2-3 cystocele, grade 1 uterine prolapse and hypermobile urethra  Assessment/Plan: 59 yo, postmenopausal female with cystocele grade 2-3 and genuine stress urinary incontinence, coming today for Cystocele repair and TVT mid-urethral sling.   Risks/complications of surgery reviewed incl infection, bleeding, damage to internal organs including bladder, bowels, ureters, blood vessels, other risks from anesthesia, VTE and delayed complications of any surgery, complications in future surgery reviewed. Reviewed risk of recurrent prolapse and recurrent stress incontinence.   , R 02/15/2015, 7:44 AM  Pt seen and evaluated, agree with above  note and plan V.. MD

## 2015-02-15 NOTE — Anesthesia Procedure Notes (Signed)
Procedure Name: Intubation Date/Time: 02/15/2015 10:50 AM Performed by: MODY, VAISHALI Pre-anesthesia Checklist: Patient identified, Emergency Drugs available, Suction available and Patient being monitored Patient Re-evaluated:Patient Re-evaluated prior to inductionOxygen Delivery Method: Circle system utilized Preoxygenation: Pre-oxygenation with 100% oxygen Intubation Type: IV induction Ventilation: Mask ventilation without difficulty Laryngoscope Size: Miller and 2 Grade View: Grade II Tube type: Oral Tube size: 7.0 mm Number of attempts: 1 Airway Equipment and Method: Stylet Placement Confirmation: ETT inserted through vocal cords under direct vision,  positive ETCO2 and breath sounds checked- equal and bilateral Secured at: 21 cm Tube secured with: Tape Dental Injury: Teeth and Oropharynx as per pre-operative assessment

## 2015-02-15 NOTE — Discharge Instructions (Signed)
Cystocele Repair Cystocele repair is surgery to remove a cystocele, which is a bulging, drooping area (hernia) of the bladder that extends into the vagina. This bulging occurs on the top front wall of the vagina. LET [facility] CARE PROVIDER KNOW ABOUT:   Any allergies you have.  All medicines you are taking, including vitamins, herbs, eye drops, creams, and over-the-counter medicines.  Use of steroids (by mouth or creams).  Previous problems you or members of your family have had with the use of anesthetics.  Any blood disorders you have.  Previous surgeries you have had.  Medical conditions you have.  Possibility of pregnancy, if this applies. RISKS AND COMPLICATIONS  Generally, this is a safe procedure. However, as with any procedure, complications can occur. Possible complications include:  Excessive bleeding.  Infection.  Injury to surrounding structures.  Problems related to anesthetics. The risks will vary depending on the type of anesthetic given.  Problems with the urinary catheter after surgery, such as blockage.  Return of the cystocele. BEFORE THE PROCEDURE   Ask your health care provider about changing or stopping your regular medicines. You may need to stop taking certain medicines 1 week before surgery.  Do not eat or drink anything after midnight the night before surgery.  If you smoke, do not smoke for at least 2 weeks before the surgery.  Do not drink any alcohol for 3 days before the surgery.  Arrange for someone to drive you home after your hospital stay and to help you with activities during recovery. PROCEDURE   You will be given a medicine that makes you sleep through the procedure (general anesthetic) or a medicine injected into your spine that numbs your body below the waist (spinal or epidural anesthetic). You will be asleep or be numbed through the entire procedure.  A thin, flexible tube (Foley catheter) will be placed in your bladder to  drain urine during and after the surgery.  The surgery is performed through the vagina. The front wall of the vagina is opened up, and the muscle between the bladder and vagina is pulled up to its normal position. This is reinforced with stitches or a piece of mesh. This removes the hernia so that the top of the vagina does not fall into the opening of the vagina.  The cut on the front wall of the vagina is then closed with absorbable stitches that do not need to be removed. AFTER THE PROCEDURE   You will be taken to a recovery area where your progress will be closely watched. Your breathing, blood pressure, and pulse (vital signs) will be checked often. When you are stable, you will be taken to a regular hospital room.  You will have a catheter in place to drain your bladder. This will stay in place for 2-7 days or until your bladder is working properly on its own.  You may have gauze packing in the vagina. This will be removed 1-2 days after the surgery.  You will be given pain medicine as needed and may be given a medicine that kills germs (antibiotic).  You will likely need to stay in the hospital for 1-2 days. Document Released: 10/03/2000 Document Revised: 06/08/2013 Document Reviewed: 03/25/2013 [facility] Patient Information 2015 [place], [place].   Urethral Vaginal Sling, Care After Refer to this sheet in the next few weeks. These instructions provide you with information on caring for yourself after your procedure. Your health care provider may also give you more specific instructions. Your treatment  has been planned according to current medical practices, but problems sometimes occur. Call your health care provider if you have any problems or questions after your procedure.  WHAT TO EXPECT AFTER THE PROCEDURE  After your procedure, it is typical to have the following:  A catheter in your bladder until your bladder is able to work on its own properly. You will be instructed on how to  empty the catheter bag.  Absorbable stitches in your incisions. They will slowly dissolve over 1-2 months. HOME CARE INSTRUCTIONS  Get plenty of rest.  Only take over-the-counter or prescription medicines as directed by your health care provider. Do not take aspirin because it can cause bleeding.  Do not take baths. Take showers until your health care provider tells you otherwise.  You may resume your usual diet. Eat a well-balanced diet.  Drink enough fluids to keep your urine clear or pale yellow.  Limit exercise and activities as directed by your health care provider. Do not lift anything heavier than 5 pounds (2.3 kg).  Do not douche, use tampons, or have sexual intercourse for 6 weeks after your procedure.  Follow up with your health care provider as directed. SEEK MEDICAL CARE IF:  You have a heavy or bad smelling vaginal discharge.   You have a rash.   You have pain that is not controlled with medicines.   You have lightheadedness or feel faint.  SEEK IMMEDIATE MEDICAL CARE IF:  You have a fever.   You have vaginal bleeding.   You faint.   You have shortness of breath.   You have chest, abdominal, or leg pain.   You have pain when urinating or cannot urinate.   Your catheter is still in your bladder and becomes blocked.   You have swelling, redness, and pain in the vaginal area.  Document Released: 07/27/2013 Document Reviewed: 07/27/2013 [facility] Patient Information 2015 [place], [place]. This information is not intended to replace advice given to you by your health care provider. Make sure you discuss any questions you have with your health care provider.   Post Anesthesia Home Care Instructions  Activity: Get plenty of rest for the remainder of the day. A responsible adult should stay with you for 24 hours following the procedure.  For the next 24 hours, DO NOT: -Drive a car -Paediatric nurse -Drink alcoholic beverages -Take any  medication unless instructed by your physician -Make any legal decisions or sign important papers.  Meals: Start with liquid foods such as gelatin or soup. Progress to regular foods as tolerated. Avoid greasy, spicy, heavy foods. If nausea and/or vomiting occur, drink only clear liquids until the nausea and/or vomiting subsides. Call your physician if vomiting continues.  Special Instructions/Symptoms: Your throat may feel dry or sore from the anesthesia or the breathing tube placed in your throat during surgery. If this causes discomfort, gargle with warm salt water. The discomfort should disappear within 24 hours.  If you had a scopolamine patch placed behind your ear for the management of post- operative nausea and/or vomiting:  1. The medication in the patch is effective for 72 hours, after which it should be removed.  Wrap patch in a tissue and discard in the trash. Wash hands thoroughly with soap and water. 2. You may remove the patch earlier than 72 hours if you experience unpleasant side effects which may include dry mouth, dizziness or visual disturbances. 3. Avoid touching the patch. Wash your hands with soap and water after contact with the  patch. °  ° ° °

## 2015-02-17 ENCOUNTER — Encounter (HOSPITAL_COMMUNITY): Payer: Self-pay | Admitting: Obstetrics & Gynecology

## 2015-03-02 ENCOUNTER — Other Ambulatory Visit: Payer: Self-pay | Admitting: Physician Assistant

## 2015-03-02 NOTE — Telephone Encounter (Signed)
Refill appropriate and filled per protocol. 

## 2015-03-05 ENCOUNTER — Other Ambulatory Visit: Payer: Self-pay | Admitting: Physician Assistant

## 2015-03-05 NOTE — Telephone Encounter (Signed)
Medication refill per protocol °

## 2015-03-07 ENCOUNTER — Encounter: Payer: Self-pay | Admitting: Physician Assistant

## 2015-03-07 ENCOUNTER — Ambulatory Visit (INDEPENDENT_AMBULATORY_CARE_PROVIDER_SITE_OTHER): Payer: 59 | Admitting: Physician Assistant

## 2015-03-07 VITALS — BP 120/80 | HR 75 | Temp 98.3°F | Resp 19 | Wt 173.0 lb

## 2015-03-07 DIAGNOSIS — M545 Low back pain: Secondary | ICD-10-CM | POA: Diagnosis not present

## 2015-03-07 MED ORDER — PREDNISONE 20 MG PO TABS: ORAL_TABLET | ORAL | Status: DC

## 2015-03-07 NOTE — Progress Notes (Addendum)
Patient ID: Yvonne Wu MRN: [id number], DOB: 14-Feb-1956, 59 y.o. Date of Encounter: 03/07/2015, 11:01 AM    Chief Complaint:  Chief Complaint  Patient presents with  . Lower back pain     HPI: 59 y.o. year old white female here with the above.  She recently had surgery for cystocele repair. Says that she is still out of work secondary to this recent surgery.  Says that this past Monday which was 03/05/15 she walked 1 mile. Says that doing that walking caused her to have significant pain across both sides of her low back. Says that's the most pain she's ever felt in her back during the day. Also the pain down her left leg has been present again over this past week. Points to her low back at about L5 level as the area of pain across both sides there. Also pain going down the lateral aspect of her left leg. She has had prior office visits with me regarding left sciatica. At prior visits she has been prescribed Celebrex and Flexeril. She says that she has been taking both of these. Says that when they were first prescribed, these medicines seem to help but says now they really don't seem to help much at all. Says that when she rolls over in bed at night, she really feels the discomfort then especially in the left sciatic notch area.  I reviewed her chart and reviewed that she has had complaints of back pain at the following office visit dates: 06/27/13 08/08/13 03/02/14 01/03/15  I reviewed her office note 06/27/13. That note stated that that was her first time needing to see a medical provider regarding back pain/sciatica. She has had x-ray of lumbar spine.     Home Meds:   Outpatient Prescriptions Prior to Visit  Medication Sig Dispense Refill  . acetaminophen (TYLENOL) 325 MG tablet Take 650 mg by mouth every 6 (six) hours as needed for mild pain or headache.    Marland Kitchen BENICAR 20 MG tablet TAKE ONE TABLET BY MOUTH ONCE DAILY. 30 tablet 3  . Calcium Carbonate-Vitamin D (CALCIUM + D  PO) Take 2 tablets by mouth daily.     . celecoxib (CELEBREX) 200 MG capsule TAKE (1) CAPSULE BY MOUTH ONCE DAILY. 30 capsule 1  . clonazePAM (KLONOPIN) 0.5 MG tablet Take 0.5 mg by mouth 2 (two) times daily as needed for anxiety.    . cyclobenzaprine (FLEXERIL) 10 MG tablet TAKE (1) TABLET BY MOUTH (3) TIMES DAILY AS NEEDED FOR MUSCLE SPASMS. 60 tablet 2  . fexofenadine (ALLEGRA) 180 MG tablet Take 180 mg by mouth daily.    . fluticasone (FLONASE) 50 MCG/ACT nasal spray Place 1 spray into both nostrils daily as needed for allergies or rhinitis.    . Naphazoline-Pheniramine (OPCON-A OP) Apply 1 drop to eye 2 (two) times daily as needed (For allergies.).    Marland Kitchen SYNTHROID 75 MCG tablet Take 1 tablet (75 mcg total) by mouth daily before breakfast. 30 tablet 5  . celecoxib (CELEBREX) 200 MG capsule Take 200 mg by mouth daily.    . celecoxib (CELEBREX) 200 MG capsule TAKE (1) CAPSULE BY MOUTH ONCE DAILY. 30 capsule 4  . clonazePAM (KLONOPIN) 0.5 MG tablet TAKE (1) TABLET BY MOUTH TWICE A DAY AS NEEDED. 60 tablet 2  . cyclobenzaprine (FLEXERIL) 10 MG tablet Take 10 mg by mouth 3 (three) times daily as needed for muscle spasms.    Marland Kitchen ibuprofen (ADVIL) 200 MG tablet Take 3 tablets (600  mg total) by mouth every 6 (six) hours as needed. (Patient not taking: Reported on 03/07/2015) 30 tablet 0  . oxyCODONE-acetaminophen (ROXICET) 5-325 MG per tablet Take 1 tablet by mouth every 4 (four) hours as needed for severe pain. (Patient not taking: Reported on 03/07/2015) 30 tablet 0  . predniSONE (DELTASONE) 20 MG tablet Take 3 daily for 2 days, then 2 daily for 2 days, then 1 daily for 2 days. (Patient not taking: Reported on 02/01/2015) 12 tablet 0   No facility-administered medications prior to visit.    Allergies: No Known Allergies    Review of Systems: See HPI for pertinent ROS. All other ROS negative.    Physical Exam: Blood pressure 120/80, pulse 75, temperature 98.3 F (36.8 C), temperature source Oral,  resp. rate 19, weight 173 lb (78.472 kg)., Body mass index is 28.79 kg/(m^2). General: WNWD WF.  Appears in no acute distress. Neck: Supple. No thyromegaly. No lymphadenopathy. Lungs: Clear bilaterally to auscultation without wheezes, rales, or rhonchi. Breathing is unlabored. Heart: Regular rhythm. No murmurs, rubs, or gallops. Msk:  Strength and tone normal for age. Mild tenderness with palpation bilateral low back at about L5 level. Positive tenderness with palpation of left sciatic notch. Right straight leg raise is normal. Right hip abduction causes pain at the left sciatic notch region. Left straight leg raise is normal. Left hip abduction causes pain at the left sciatic notch region. 2+ patella reflexes equal and symmetrical bilateral. 5/5 strength of lower extremities and dorsiflexion and extension of the feet bilaterally. Extremities/Skin: Warm and dry. Neuro: Alert and oriented X 3. Moves all extremities spontaneously. Gait is normal. CNII-XII grossly in tact. Psych:  Responds to questions appropriately with a normal affect.     ASSESSMENT AND PLAN:  59 y.o. year old female with  1. Low back pain, unspecified back pain laterality, with sciatica presence unspecified  - predniSONE (DELTASONE) 20 MG tablet; Take 3 daily for 2 days, then 2 daily for 2 days, then 1 daily for 2 days.  Dispense: 12 tablet; Refill: 0 - MR Lumbar Spine Wo Contrast; Future - Ambulatory referral to Orthopedic Surgery  Continue the Flexeril and Celebrex. Will use prednisone taper to try to relieve sciatic symptoms and inflammation symptoms to provide some immediate relief. Will go ahead and obtain MRI of lumbar spine. Will go ahead and order for fall to follow-up with a spine specialist. She does not have any preference of whether she sees a neurosurgeon versus an orthopedic spine specialist.    05/23/2015--I received fax and this addendum added:  She had visit at [facility] 05/02/2015  with Dr. Nelva Bush. At that time he performed Left L3 selective nerve root block. Plan follow-up with Dr. Rolena Infante in 2 weeks.  Signed, [id number] [place], [place], [facility] 03/07/2015 11:01 AM

## 2015-03-16 ENCOUNTER — Ambulatory Visit (HOSPITAL_COMMUNITY)
Admission: RE | Admit: 2015-03-16 | Discharge: 2015-03-16 | Disposition: A | Discharge status: Home / Self Care | Payer: 59 | Source: Ambulatory Visit | Attending: Physician Assistant | Admitting: Physician Assistant

## 2015-03-16 DIAGNOSIS — M4806 Spinal stenosis, lumbar region: Secondary | ICD-10-CM | POA: Diagnosis not present

## 2015-03-16 DIAGNOSIS — M5136 Other intervertebral disc degeneration, lumbar region: Secondary | ICD-10-CM | POA: Diagnosis not present

## 2015-03-16 DIAGNOSIS — M545 Low back pain: Secondary | ICD-10-CM | POA: Diagnosis present

## 2015-03-16 DIAGNOSIS — M419 Scoliosis, unspecified: Secondary | ICD-10-CM | POA: Diagnosis not present

## 2015-03-16 DIAGNOSIS — M5126 Other intervertebral disc displacement, lumbar region: Secondary | ICD-10-CM | POA: Diagnosis not present

## 2015-04-24 ENCOUNTER — Other Ambulatory Visit: Payer: Self-pay | Admitting: Physician Assistant

## 2015-04-24 NOTE — Telephone Encounter (Signed)
Ok to refill 

## 2015-04-25 NOTE — Telephone Encounter (Signed)
Approved # [phone] [id number] 

## 2015-04-25 NOTE — Telephone Encounter (Signed)
Medication refilled per protocol. 

## 2015-06-11 ENCOUNTER — Ambulatory Visit (INDEPENDENT_AMBULATORY_CARE_PROVIDER_SITE_OTHER): Payer: Commercial Managed Care - HMO | Admitting: Physician Assistant

## 2015-06-11 ENCOUNTER — Encounter: Payer: Self-pay | Admitting: Physician Assistant

## 2015-06-11 VITALS — BP 108/74 | HR 80 | Temp 97.5°F | Resp 18 | Wt 175.0 lb

## 2015-06-11 DIAGNOSIS — R300 Dysuria: Secondary | ICD-10-CM

## 2015-06-11 DIAGNOSIS — N3000 Acute cystitis without hematuria: Secondary | ICD-10-CM | POA: Diagnosis not present

## 2015-06-11 LAB — URINALYSIS, ROUTINE W REFLEX MICROSCOPIC
Bilirubin Urine: NEGATIVE
GLUCOSE, UA: NEGATIVE
HGB URINE DIPSTICK: NEGATIVE
Ketones, ur: NEGATIVE
Nitrite: POSITIVE — AB
PH: 6 (ref 5.0–8.0)
Specific Gravity, Urine: 1.015 (ref 1.001–1.035)

## 2015-06-11 LAB — URINALYSIS, MICROSCOPIC ONLY
CASTS: NONE SEEN [LPF]
Crystals: NONE SEEN [HPF]
YEAST: NONE SEEN [HPF]

## 2015-06-11 MED ORDER — CIPROFLOXACIN HCL 500 MG PO TABS: 500.0000 mg | ORAL_TABLET | Freq: Two times a day (BID) | ORAL | Status: DC

## 2015-06-11 NOTE — Progress Notes (Signed)
Patient ID: Yvonne Wu MRN: [id number], DOB: 1956/05/05, 59 y.o. Date of Encounter: 06/11/2015, 4:27 PM    Chief Complaint:  Chief Complaint  Patient presents with  . c/o UTI    painful urination x 4 days     HPI: 59 y.o. year old white female presents with above symptoms. Says that she really hasn't noticed many other symptoms other than just some burning when she urinates and also some pain feels like a, shoots up in her lower abdomen when she urinates. Has noticed no new back pain and has had no fevers or chills. Some mild frequency and urgency.     Home Meds:   Outpatient Prescriptions Prior to Visit  Medication Sig Dispense Refill  . acetaminophen (TYLENOL) 325 MG tablet Take 650 mg by mouth every 6 (six) hours as needed for mild pain or headache.    Marland Kitchen BENICAR 20 MG tablet TAKE ONE TABLET BY MOUTH ONCE DAILY. 30 tablet 3  . Calcium Carbonate-Vitamin D (CALCIUM + D PO) Take 2 tablets by mouth daily.     . celecoxib (CELEBREX) 200 MG capsule TAKE (1) CAPSULE BY MOUTH ONCE DAILY. 30 capsule 1  . clonazePAM (KLONOPIN) 0.5 MG tablet Take 0.5 mg by mouth 2 (two) times daily as needed for anxiety.    . cyclobenzaprine (FLEXERIL) 10 MG tablet TAKE (1) TABLET BY MOUTH (3) TIMES DAILY AS NEEDED FOR MUSCLE SPASMS. 60 tablet 2  . fexofenadine (ALLEGRA) 180 MG tablet Take 180 mg by mouth daily.    . fluticasone (FLONASE) 50 MCG/ACT nasal spray Place 1 spray into both nostrils daily as needed for allergies or rhinitis.    Marland Kitchen ibuprofen (ADVIL) 200 MG tablet Take 3 tablets (600 mg total) by mouth every 6 (six) hours as needed. 30 tablet 0  . Naphazoline-Pheniramine (OPCON-A OP) Apply 1 drop to eye 2 (two) times daily as needed (For allergies.).    Marland Kitchen SYNTHROID 75 MCG tablet Take 1 tablet (75 mcg total) by mouth daily before breakfast. 30 tablet 5  . oxyCODONE-acetaminophen (ROXICET) 5-325 MG per tablet Take 1 tablet by mouth every 4 (four) hours as needed for severe pain. (Patient not  taking: Reported on 03/07/2015) 30 tablet 0  . predniSONE (DELTASONE) 20 MG tablet Take 3 daily for 2 days, then 2 daily for 2 days, then 1 daily for 2 days. (Patient not taking: Reported on 02/01/2015) 12 tablet 0  . predniSONE (DELTASONE) 20 MG tablet Take 3 daily for 2 days, then 2 daily for 2 days, then 1 daily for 2 days. 12 tablet 0   No facility-administered medications prior to visit.    Allergies: No Known Allergies    Review of Systems: See HPI for pertinent ROS. All other ROS negative.    Physical Exam: Blood pressure 108/74, pulse 80, temperature 97.5 F (36.4 C), temperature source Oral, resp. rate 18, weight 175 lb (79.379 kg)., Body mass index is 29.12 kg/(m^2). General: WNWD WF  Appears in no acute distress. Neck: Supple. No thyromegaly. No lymphadenopathy. Lungs: Clear bilaterally to auscultation without wheezes, rales, or rhonchi. Breathing is unlabored. Heart: Regular rhythm. No murmurs, rubs, or gallops. Abdomen: Soft, non-tender, non-distended with normoactive bowel sounds. No hepatomegaly. No rebound/guarding. No obvious abdominal masses. No tenderness with palpation even in the low abdomen/suprapubic area. Msk:  Strength and tone normal for age. No tenderness with percussion of costophrenic angles bilaterally. Extremities/Skin: Warm and dry. Neuro: Alert and oriented X 3. Moves all extremities spontaneously. Gait is  normal. CNII-XII grossly in tact. Psych:  Responds to questions appropriately with a normal affect.      ASSESSMENT AND PLAN:  59 y.o. year old female with  1. Acute cystitis without hematuria UA shows  Positive Nitrite, 2+ Leukocytes. Consistent with positive infection. She is to start Cipro immediately take as directed and complete all of it. Follow up if symptoms worsen significantly or if they do not resolve upon completion of antibiotic. - ciprofloxacin (CIPRO) 500 MG tablet; Take 1 tablet (500 mg total) by mouth 2 (two) times daily.  Dispense:  10 tablet; Refill: 0  2. Dysuria - Urinalysis, Routine w reflex microscopic (not at [facility])   Signed, [facility], [place], [facility] 06/11/2015 4:27 PM

## 2015-06-15 ENCOUNTER — Other Ambulatory Visit: Payer: Self-pay | Admitting: Physician Assistant

## 2015-06-15 ENCOUNTER — Other Ambulatory Visit: Payer: Self-pay | Admitting: Family Medicine

## 2015-06-15 NOTE — Telephone Encounter (Signed)
Medication refilled per protocol. 

## 2015-07-20 ENCOUNTER — Other Ambulatory Visit: Payer: Self-pay | Admitting: Physician Assistant

## 2015-07-20 NOTE — Telephone Encounter (Signed)
Medication refilled per protocol. 

## 2015-10-01 IMAGING — CR DG LUMBAR SPINE COMPLETE 4+V
5 series · 5 of 5 positions shown · non-contrast
Comparison: CT abdomen 12/07/2007.

CLINICAL DATA: Low back pain .

EXAM:
LUMBAR SPINE - COMPLETE 4+ VIEW

[view not recorded (1 of 5)]
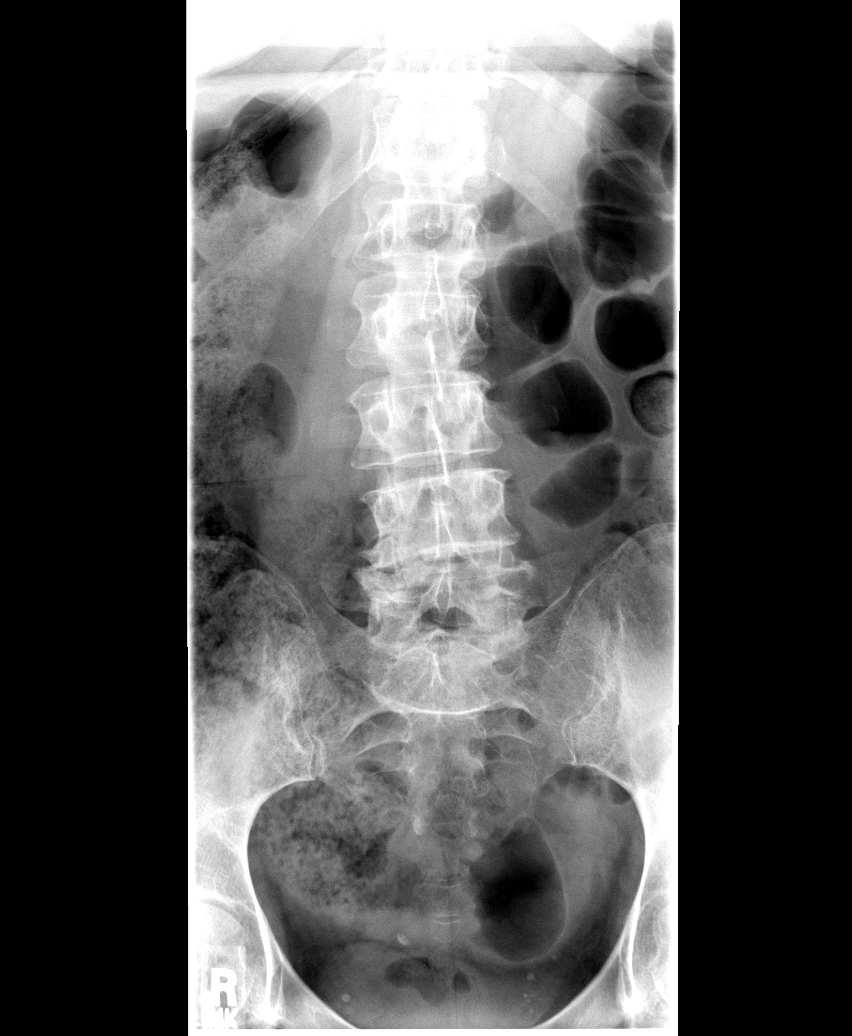

[view not recorded (2 of 5)]
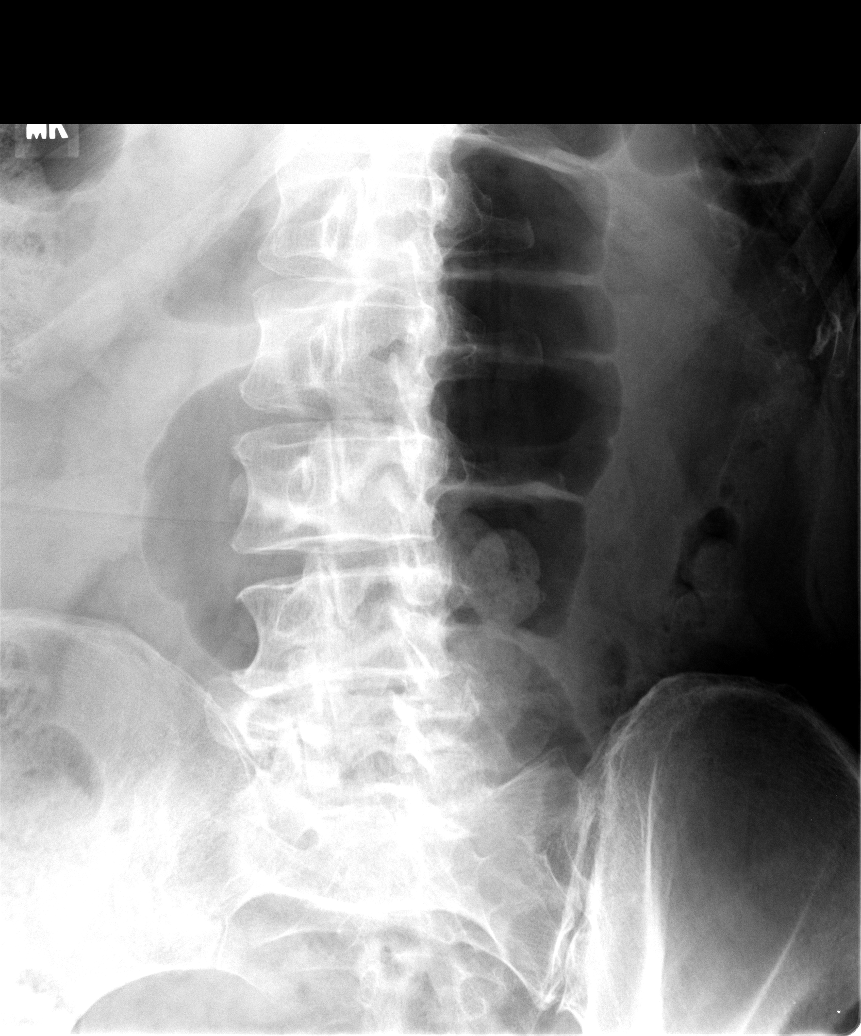

[view not recorded (3 of 5)]
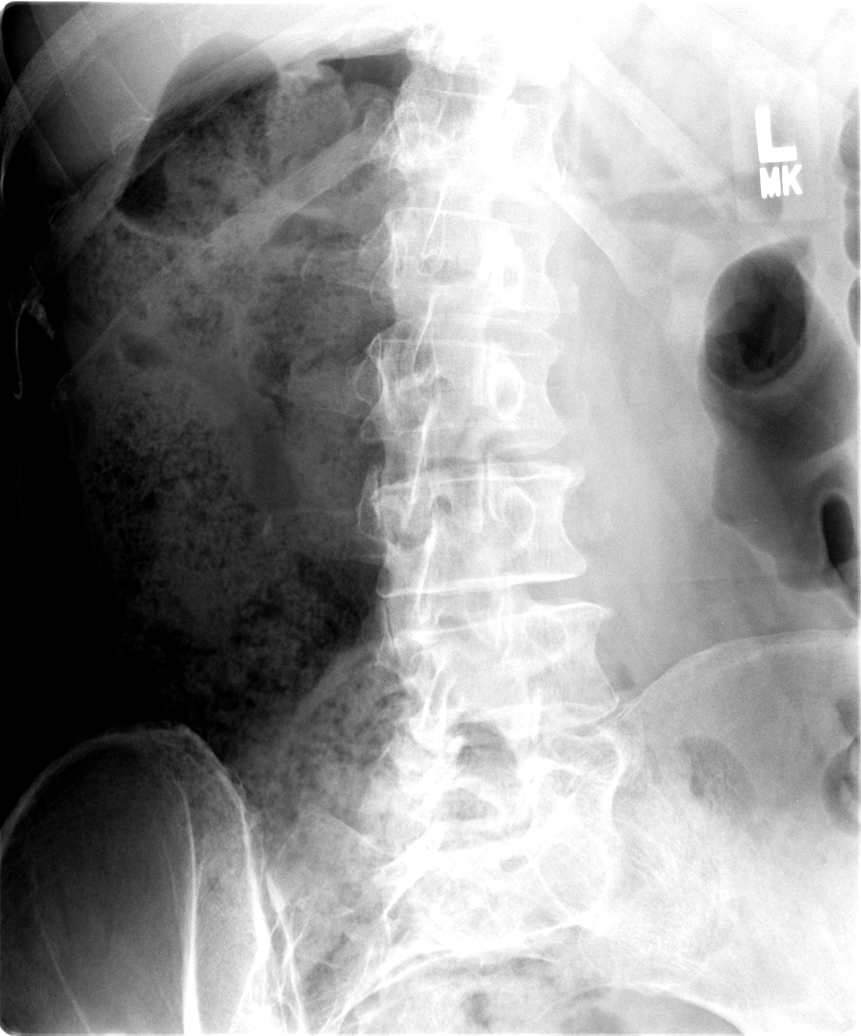

[view not recorded (4 of 5)]
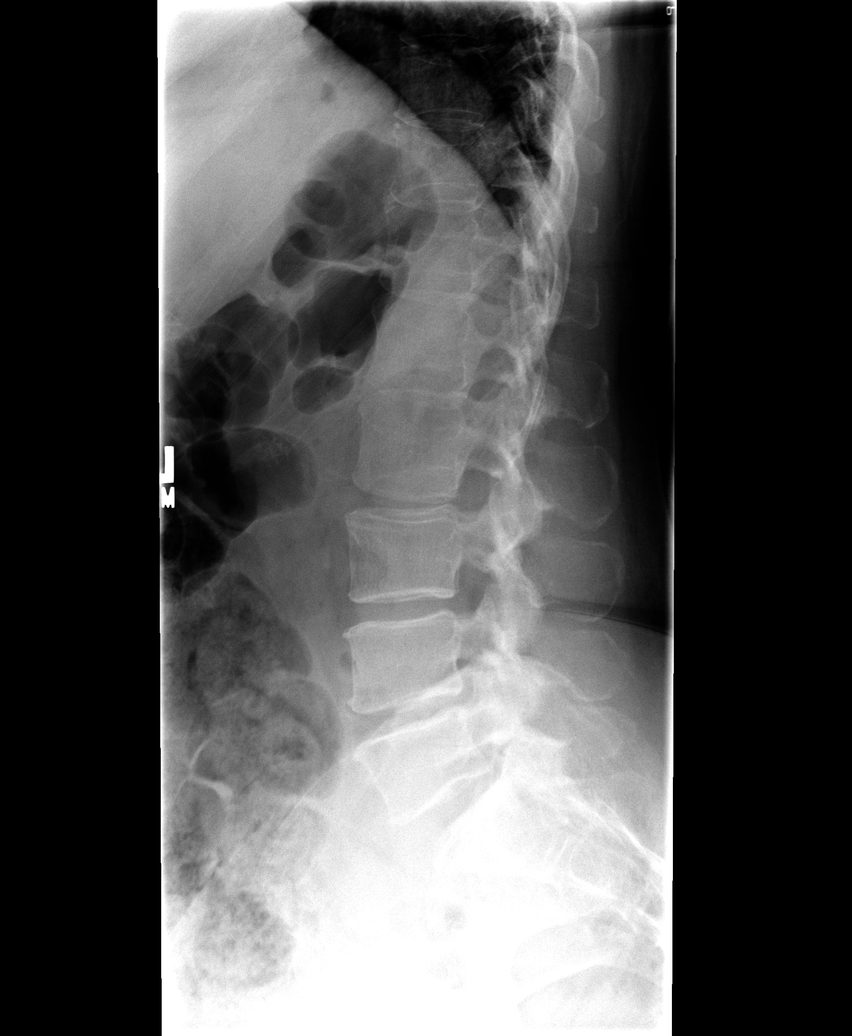

[view not recorded (5 of 5)]
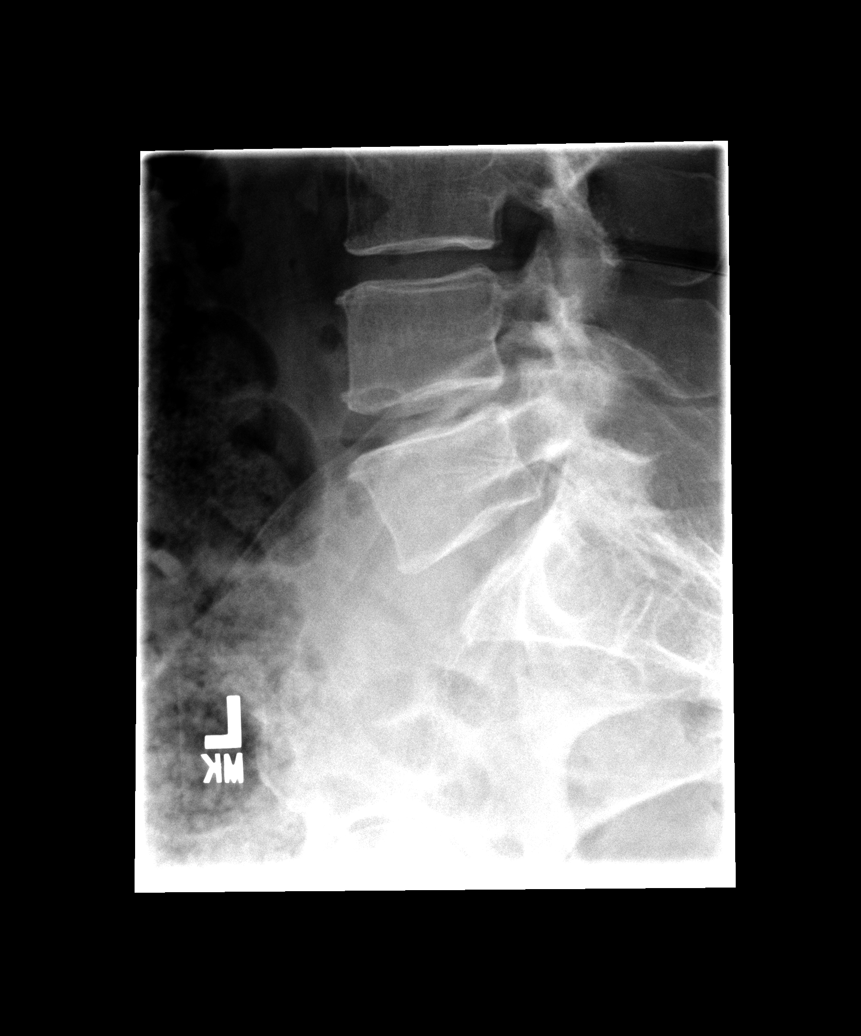

[5 of 5 positions shown; findings below may reference images not displayed]

FINDINGS: Diffuse degenerative changes of the lumbar spine with scoliosis
concave left. Mild thoracolumbar compression fractures noted at T11,
T12, and L1. These are new from prior CT12/07/2007. No retropulsed
fragments. Pelvic calcifications consistent with phleboliths. Stool
in colon.
IMPRESSION: Degenerative changes lumbar spine with mild scoliosis concave left.
There are very mild compression fractures at T11, T12, and L1. These
are new from prior CT of 12/07/2007.

## 2015-10-11 ENCOUNTER — Encounter: Payer: Self-pay | Admitting: Physician Assistant

## 2015-10-11 ENCOUNTER — Ambulatory Visit (INDEPENDENT_AMBULATORY_CARE_PROVIDER_SITE_OTHER): Payer: Commercial Managed Care - HMO | Admitting: Physician Assistant

## 2015-10-11 VITALS — BP 124/78 | HR 68 | Temp 97.6°F | Resp 14 | Ht 62.75 in | Wt 180.0 lb

## 2015-10-11 DIAGNOSIS — F329 Major depressive disorder, single episode, unspecified: Secondary | ICD-10-CM | POA: Diagnosis not present

## 2015-10-11 DIAGNOSIS — E039 Hypothyroidism, unspecified: Secondary | ICD-10-CM | POA: Diagnosis not present

## 2015-10-11 DIAGNOSIS — F419 Anxiety disorder, unspecified: Secondary | ICD-10-CM | POA: Diagnosis not present

## 2015-10-11 DIAGNOSIS — M545 Low back pain: Secondary | ICD-10-CM | POA: Diagnosis not present

## 2015-10-11 DIAGNOSIS — I10 Essential (primary) hypertension: Secondary | ICD-10-CM

## 2015-10-11 DIAGNOSIS — F32A Depression, unspecified: Secondary | ICD-10-CM

## 2015-10-11 DIAGNOSIS — L209 Atopic dermatitis, unspecified: Secondary | ICD-10-CM

## 2015-10-11 DIAGNOSIS — N393 Stress incontinence (female) (male): Secondary | ICD-10-CM

## 2015-10-11 MED ORDER — TRIAMCINOLONE ACETONIDE 0.025 % EX CREA: 1.0000 "application " | TOPICAL_CREAM | Freq: Two times a day (BID) | CUTANEOUS | Status: DC

## 2015-10-11 MED ORDER — CELECOXIB 200 MG PO CAPS: 200.0000 mg | ORAL_CAPSULE | Freq: Two times a day (BID) | ORAL | Status: DC

## 2015-10-11 NOTE — Progress Notes (Signed)
Patient ID: Yvonne Wu MRN: [id number], DOB: [birth year], 59 y.o. Date of Encounter: 10/11/2015,   Chief Complaint: Routine followup of blood pressure, thyroid. Also having area of itch skin near eye.  HPI: 59 y.o. y/o white female  here for above.  (NOTE: I have been seeing her for years. She was having situational anxiety and depression because her husband had cancer--leukemia. He died of Leukemia 01/20/13.)  At visit May 2015 she had told me that she had gone off of her Wellbutrin and was feeling fine without the medication in regards to anxiety and depression symptoms.  Also reported that she is rarely needing her clonazepam. Today she reports that all of this is remained the same. Says that she is feeling okay in regards to mood.  At [place] 12/2014 she was in the process of moving into a new house. Said that after her husband passed away, she needed to down size and get rid of a lot of stuff.  Built a new house near her mom. Her daughter and grandchildren live with patient. Pt states that her daughter is bipolar and that she "probably always will need to live with me "  She is taking blood pressure medication as directed with no adverse effects.  She is taking her thyroid medication as directed.  At [place] 10/11/2015: Says she gets this area of itchy irritated skin occasionally. Applies moisturizer and it gets better but says that I had given her some steroid cream in the past and she is needing a refill on that.  At [place] 10/11/2015: She is also requesting an increase in her dose of Celebrex. Says that she is on her feet all day and her feet hurt. Also occasional back pain and shoulder pain.  No other complaints or concerns today.  Review of Systems: Consitutional: No fever, chills, fatigue, night sweats, lymphadenopathy. No significant/unexplained weight changes. Eyes: No visual changes, eye redness, or discharge. ENT/Mouth: No ear pain, sore throat, nasal drainage, or sinus  pain. Cardiovascular: No chest pressure,heaviness, tightness or squeezing, even with exertion. No increased shortness of breath or dyspnea on exertion.No palpitations, edema, orthopnea, PND. Respiratory: No cough, hemoptysis, SOB, or wheezing. Gastrointestinal: No anorexia, dysphagia, reflux, pain, nausea, vomiting, hematemesis, diarrhea, constipation, BRBPR, or melena. Breast: No mass, nodules, bulging, or retraction. No skin changes or inflammation. No nipple discharge. No lymphadenopathy. Genitourinary: No dysuria, hematuria, incontinence, vaginal discharge, pruritis, burning, abnormal bleeding, or pain. Musculoskeletal: No decreased ROM, No joint pain or swelling. No significant pain in neck. Skin: No rash, pruritis, or concerning lesions. Neurological: No headache, dizziness, syncope, seizures, tremors, memory loss, coordination problems, or paresthesias. Psychological: No anxiety, depression, hallucinations, SI/HI. Endocrine: No polydipsia, polyphagia, polyuria, or known diabetes.No increased fatigue. No palpitations/rapid heart rate. No significant/unexplained weight change. All other systems were reviewed and are otherwise negative.  Past Medical History  Diagnosis Date  . Anxiety   . Hypertension   . Hypothyroid   . Depression     situational  . Urinary, incontinence, stress female 02/15/2015     Past Surgical History  Procedure Laterality Date  . Tubal ligation    . Bunionectomy Left   . Bladder suspension N/A 02/15/2015    Procedure: TRANSVAGINAL TAPE (TVT) Mid-Urethral Sling PROCEDURE;  Surgeon: Azucena Fallen, MD;  Location: [facility];  Service: Gynecology;  Laterality: N/A;  2 hrs.  . Cystoscopy N/A 02/15/2015    Procedure: CYSTOSCOPY;  Surgeon: Azucena Fallen, MD;  Location: [facility];  Service: Gynecology;  Laterality: N/A;  .  Cystocele repair N/A 02/15/2015    Procedure: ANTERIOR REPAIR (CYSTOCELE)/Colporrhaphy;  Surgeon: Azucena Fallen, MD;  Location: [facility];  Service:  Gynecology;  Laterality: N/A;    Home Meds:  Current Outpatient Prescriptions on File Prior to Visit  Medication Sig Dispense Refill  . acetaminophen (TYLENOL) 325 MG tablet Take 650 mg by mouth every 6 (six) hours as needed for mild pain or headache.    Marland Kitchen BENICAR 20 MG tablet TAKE ONE TABLET BY MOUTH ONCE DAILY. 30 tablet 5  . Calcium Carbonate-Vitamin D (CALCIUM + D PO) Take 2 tablets by mouth daily.     . fexofenadine (ALLEGRA) 180 MG tablet Take 180 mg by mouth daily.    . fluticasone (FLONASE) 50 MCG/ACT nasal spray USE 2 SPRAYS IN EACH NOSTRIL ONCE DAILY. 16 g 5  . SYNTHROID 75 MCG tablet TAKE ONE TABLET BY MOUTH ONCE DAILY. 30 tablet 3  . clonazePAM (KLONOPIN) 0.5 MG tablet Take 0.5 mg by mouth 2 (two) times daily as needed for anxiety. Reported on 10/11/2015    . cyclobenzaprine (FLEXERIL) 10 MG tablet TAKE (1) TABLET BY MOUTH (3) TIMES DAILY AS NEEDED FOR MUSCLE SPASMS. (Patient not taking: Reported on 10/11/2015) 60 tablet 2   No current facility-administered medications on file prior to visit.    Allergies: No Known Allergies  Social History   Social History  . Marital Status: Widowed    Spouse Name: N/A  . Number of Children: N/A  . Years of Education: N/A   Occupational History  . Not on file.   Social History Main Topics  . Smoking status: Former Smoker    Quit date: 02/22/1995  . Smokeless tobacco: Never Used  . Alcohol Use: No  . Drug Use: No  . Sexual Activity: Not Currently   Other Topics Concern  . Not on file   Social History Narrative   Works at [employer].   Involves a lot of standing and bending.   Husband passed away in Feb 07, 2013 with leukemia.   Patient states that her daughter is age 48 (as of 17) and has bipolar. This daughter lives with her.   This daughter has a child who is 62 years old and another child he was 3 years old. They all live with the patient.   Patient also has a son who lives in [place].          Family History   Problem Relation Age of Onset  . Asthma Mother   . Mental illness Mother   . Cancer Father 51    lung cancer    Physical Exam: Blood pressure 124/78, pulse 68, temperature 97.6 F (36.4 C), temperature source Oral, resp. rate 14, height 5' 2.75" (1.594 m), weight 180 lb (81.647 kg)., Body mass index is 32.13 kg/(m^2). General: Well developed, well nourished,WF. Appears  in no acute distress. Neck: Supple. Trachea midline. No thyromegaly. Full ROM. No lymphadenopathy.No Carotid Bruits. Lungs: Clear to auscultation bilaterally without wheezes, rales, or rhonchi. Breathing is of normal effort and unlabored. Cardiovascular: RRR with S1 S2. No murmurs, rubs, or gallops. Distal pulses 2+ symmetrically. No carotid or abdominal bruits. Abdomen: Soft, non-tender, non-distended with normoactive bowel sounds. No hepatosplenomegaly or masses. No rebound/guarding. No CVA tenderness. No hernias.  Musculoskeletal: Full range of motion and 5/5 strength throughout.  Skin: Warm and moist without erythema, ecchymosis, wounds, or rash. Neuro: A+Ox3. CN II-XII grossly intact. Moves all extremities spontaneously. Full sensation throughout. Normal gait.  Psych:  Responds to questions appropriately  with a normal affect.   Assessment/Plan:  1. Anxiety She is now stable off of her Wellbutrin. Will continue to have clonazepam available if needed.  2. Depression This is stable and resolved and is now off of Wellbutrin. Situational given her husband with cancer and death.  3. Hypertension Blood Pressure is at goal. Continue current medication. Check labs monitor. - COMPLETE METABOLIC PANEL WITH GFR  4. Hypothyroid She is taking thyroid medication daily. Check lab to monitor. - TSH  5. Vitamin D deficiency - She is taking vitamin D supplements.  6. Favorable lipid status. She had a complete physical exam and fasting labs 07/2013. Lipids were good with LDL 128 and all other parameters normal.   Atopic  dermatitis - triamcinolone (KENALOG) 0.025 % cream; Apply 1 application topically 2 (two) times daily.  Dispense: 30 g; Refill: 0  Low back pain, Pain in Feet, Pain in Shoulder Will increase Celebrex to twice a day dosing. - celecoxib (CELEBREX) 200 MG capsule; Take 1 capsule (200 mg total) by mouth 2 (two) times daily.  Dispense: 60 capsule; Refill: 5   Preventive Care:   B. Pap- Per Gyn C. Mammogram: Per Gyn D. DEXA: Per Gyn E. screening colonoscopy: This was done December 2008. Normal. Repeat 10 years. F. Immunizations: Influenza---N/A today Tetanus vaccine: She says that she had a finger laceration --in 2008-- this should have been updated at that time 2008 Pneumovax--Willl give it age 58. He does not smoke. She has no indication to need this until age 21. Zostavax --discuss at  age 89   ROV 6 months, sooner if needed.  [address] [place], [place], [facility] 10/11/2015 4:41 PM

## 2015-10-12 ENCOUNTER — Encounter: Payer: Self-pay | Admitting: Family Medicine

## 2015-10-12 LAB — COMPLETE METABOLIC PANEL WITH GFR
ALBUMIN: 4.6 g/dL (ref 3.6–5.1)
ALT: 19 U/L (ref 6–29)
AST: 18 U/L (ref 10–35)
Alkaline Phosphatase: 87 U/L (ref 33–130)
BILIRUBIN TOTAL: 0.4 mg/dL (ref 0.2–1.2)
BUN: 10 mg/dL (ref 7–25)
CALCIUM: 8.9 mg/dL (ref 8.6–10.4)
CO2: 26 mmol/L (ref 20–31)
CREATININE: 0.67 mg/dL (ref 0.50–1.05)
Chloride: 105 mmol/L (ref 98–110)
GFR, Est Non African American: >89 mL/min (ref >=60)
Glucose, Bld: 84 mg/dL (ref 70–99)
Potassium: 3.6 mmol/L (ref 3.5–5.3)
Sodium: 140 mmol/L (ref 135–146)
TOTAL PROTEIN: 6.7 g/dL (ref 6.1–8.1)

## 2015-10-12 LAB — TSH: TSH: 2.5 u[IU]/mL (ref 0.350–4.500)

## 2015-10-23 ENCOUNTER — Other Ambulatory Visit: Payer: Self-pay | Admitting: Obstetrics and Gynecology

## 2015-10-23 DIAGNOSIS — M8580 Other specified disorders of bone density and structure, unspecified site: Secondary | ICD-10-CM

## 2015-12-18 ENCOUNTER — Other Ambulatory Visit: Payer: Self-pay | Admitting: Physician Assistant

## 2015-12-18 NOTE — Telephone Encounter (Signed)
Refill appropriate and filled per protocol. 

## 2015-12-20 ENCOUNTER — Ambulatory Visit (INDEPENDENT_AMBULATORY_CARE_PROVIDER_SITE_OTHER): Payer: Commercial Managed Care - HMO | Admitting: Physician Assistant

## 2015-12-20 ENCOUNTER — Encounter: Payer: Self-pay | Admitting: Physician Assistant

## 2015-12-20 VITALS — BP 132/80 | HR 64 | Temp 97.8°F | Resp 18 | Wt 179.0 lb

## 2015-12-20 DIAGNOSIS — N390 Urinary tract infection, site not specified: Secondary | ICD-10-CM

## 2015-12-20 DIAGNOSIS — R309 Painful micturition, unspecified: Secondary | ICD-10-CM | POA: Diagnosis not present

## 2015-12-20 LAB — URINALYSIS, ROUTINE W REFLEX MICROSCOPIC
Bilirubin Urine: NEGATIVE
Glucose, UA: NEGATIVE
Ketones, ur: NEGATIVE
NITRITE: NEGATIVE
PROTEIN: NEGATIVE
Specific Gravity, Urine: 1.01 (ref 1.001–1.035)
pH: 7 (ref 5.0–8.0)

## 2015-12-20 LAB — URINALYSIS, MICROSCOPIC ONLY
CRYSTALS: NONE SEEN [HPF]
Casts: NONE SEEN [LPF]
Yeast: NONE SEEN [HPF]

## 2015-12-20 MED ORDER — CIPROFLOXACIN HCL 500 MG PO TABS: 500.0000 mg | ORAL_TABLET | Freq: Two times a day (BID) | ORAL | Status: DC

## 2015-12-20 NOTE — Progress Notes (Signed)
Patient ID: Yvonne Wu MRN: [id number], DOB: May 10, 1956, 60 y.o. Date of Encounter: 12/20/2015, 1:38 PM    Chief Complaint:  Chief Complaint  Patient presents with  . flank pain x 2 weeks off/on    now urinary frequency and painful,  hx stones     HPI: 60 y.o. year old female presents with above.   She states that off and on for the past couple weeks she has had some sharp pain off and on in her right low back. Says that it would be sudden onset and suddenly hurt significantly but then go away. Also says that she has tried to massage that area this thinking it may be sore muscle but it was not sore with palpation. Also, the pain would hit at random times. Says that Sunday night she had to keep getting up to go to the bathroom about 5 times-- she says she never does this. Also says that she has had no further pain since then--as far as the pain that she had been having off and on over the past couple weeks. Says that Monday morning she had some dysuria. Today she saw a drop of blood in her urine.  Today also had dysuria again.  Says in the past she had severe colicky pain and went to the ER --says that they did a CT that showed no stone but at that time was felt that she had had a kidney stone that she had just passed prior to the CT. She states that she thinks that she just recently had a small stone that she passed Sunday night.  No other complaints or concerns. No fevers or chills.     Home Meds:   Outpatient Prescriptions Prior to Visit  Medication Sig Dispense Refill  . acetaminophen (TYLENOL) 325 MG tablet Take 650 mg by mouth every 6 (six) hours as needed for mild pain or headache.    Marland Kitchen BENICAR 20 MG tablet TAKE ONE TABLET BY MOUTH ONCE DAILY. 30 tablet 5  . Calcium Carbonate-Vitamin D (CALCIUM + D PO) Take 2 tablets by mouth daily.     . celecoxib (CELEBREX) 200 MG capsule Take 1 capsule (200 mg total) by mouth 2 (two) times daily. 60 capsule 5  . fexofenadine  (ALLEGRA) 180 MG tablet Take 180 mg by mouth daily.    . fluticasone (FLONASE) 50 MCG/ACT nasal spray USE 2 SPRAYS IN EACH NOSTRIL ONCE DAILY. 16 g 5  . SYNTHROID 75 MCG tablet TAKE ONE TABLET BY MOUTH ONCE DAILY. 30 tablet 0  . triamcinolone (KENALOG) 0.025 % cream Apply 1 application topically 2 (two) times daily. 30 g 0  . clonazePAM (KLONOPIN) 0.5 MG tablet Take 0.5 mg by mouth 2 (two) times daily as needed for anxiety. Reported on 12/20/2015    . cyclobenzaprine (FLEXERIL) 10 MG tablet TAKE (1) TABLET BY MOUTH (3) TIMES DAILY AS NEEDED FOR MUSCLE SPASMS. (Patient not taking: Reported on 10/11/2015) 60 tablet 2   No facility-administered medications prior to visit.    Allergies: No Known Allergies    Review of Systems: See HPI for pertinent ROS. All other ROS negative.    Physical Exam: Blood pressure 132/80, pulse 64, temperature 97.8 F (36.6 C), temperature source Oral, resp. rate 18, weight 179 lb (81.194 kg)., Body mass index is 31.96 kg/(m^2). General: WNWD WF. Appears in no acute distress. Neck: Supple. No thyromegaly. No lymphadenopathy. Lungs: Clear bilaterally to auscultation without wheezes, rales, or rhonchi. Breathing is unlabored. Heart:  Regular rhythm. No murmurs, rubs, or gallops. Abdomen: Soft,  non-distended with normoactive bowel sounds. No hepatomegaly. No rebound/guarding. No obvious abdominal masses. Minimal tenderness/ pressure with palpation of suprapubic area. No other area with tenderness with palpation. Msk:  Strength and tone normal for age. Extremities/Skin: Warm and dry.  Neuro: Alert and oriented X 3. Moves all extremities spontaneously. Gait is normal. CNII-XII grossly in tact. Psych:  Responds to questions appropriately with a normal affect.   Results for orders placed or performed in visit on 12/20/15  Urinalysis, Routine w reflex microscopic (not at [facility])  Result Value Ref Range   Color, Urine YELLOW YELLOW   APPearance CLEAR CLEAR   Specific  Gravity, Urine 1.010 1.001 - 1.035   pH 7.0 5.0 - 8.0   Glucose, UA NEGATIVE NEGATIVE   Bilirubin Urine NEGATIVE NEGATIVE   Ketones, ur NEGATIVE NEGATIVE   Hgb urine dipstick 2+ (A) NEGATIVE   Protein, ur NEGATIVE NEGATIVE   Nitrite NEGATIVE NEGATIVE   Leukocytes, UA 2+ (A) NEGATIVE  Urine Microscopic  Result Value Ref Range   WBC, UA 20-40 (A) <=5 WBC/HPF   RBC / HPF 0-2 <=2 RBC/HPF   Squamous Epithelial / LPF 0-5 <=5 HPF   Bacteria, UA FEW (A) NONE SEEN HPF   Crystals NONE SEEN NONE SEEN HPF   Casts NONE SEEN NONE SEEN LPF   Yeast NONE SEEN NONE SEEN HPF     ASSESSMENT AND PLAN:  60 y.o. year old female with  1. Urinary tract infection, site not specified She is to start Cipro immediately take as directed and complete all of it. Send culture and follow-up those results. - Urine culture - ciprofloxacin (CIPRO) 500 MG tablet; Take 1 tablet (500 mg total) by mouth 2 (two) times daily.  Dispense: 10 tablet; Refill: 0  2. Painful urination - Urinalysis, Routine w reflex microscopic (not at [facility])   Signed, [facility] [place], [place], [facility] 12/20/2015 1:38 PM

## 2015-12-22 LAB — URINE CULTURE: Colony Count: 85000

## 2016-01-11 ENCOUNTER — Other Ambulatory Visit: Payer: Self-pay | Admitting: Physician Assistant

## 2016-01-11 ENCOUNTER — Other Ambulatory Visit: Payer: Self-pay | Admitting: Family Medicine

## 2016-01-11 NOTE — Telephone Encounter (Signed)
Refill appropriate and filled per protocol. 

## 2016-01-23 ENCOUNTER — Ambulatory Visit
Admission: RE | Admit: 2016-01-23 | Discharge: 2016-01-23 | Disposition: A | Discharge status: Home / Self Care | Payer: Commercial Managed Care - HMO | Source: Ambulatory Visit | Attending: Obstetrics and Gynecology | Admitting: Obstetrics and Gynecology

## 2016-01-23 DIAGNOSIS — M8580 Other specified disorders of bone density and structure, unspecified site: Secondary | ICD-10-CM

## 2016-01-23 LAB — HM DEXA SCAN

## 2016-01-24 ENCOUNTER — Encounter: Payer: Self-pay | Admitting: Family Medicine

## 2016-01-24 DIAGNOSIS — M8580 Other specified disorders of bone density and structure, unspecified site: Secondary | ICD-10-CM | POA: Insufficient documentation

## 2016-02-21 ENCOUNTER — Ambulatory Visit: Payer: Commercial Managed Care - HMO | Admitting: Physician Assistant

## 2016-02-21 ENCOUNTER — Encounter: Payer: Self-pay | Admitting: Physician Assistant

## 2016-02-21 ENCOUNTER — Ambulatory Visit (INDEPENDENT_AMBULATORY_CARE_PROVIDER_SITE_OTHER): Payer: Commercial Managed Care - HMO | Admitting: Physician Assistant

## 2016-02-21 VITALS — BP 132/86 | HR 60 | Temp 97.5°F | Resp 18 | Wt 174.0 lb

## 2016-02-21 DIAGNOSIS — N390 Urinary tract infection, site not specified: Secondary | ICD-10-CM

## 2016-02-21 DIAGNOSIS — R309 Painful micturition, unspecified: Secondary | ICD-10-CM | POA: Diagnosis not present

## 2016-02-21 LAB — URINALYSIS, ROUTINE W REFLEX MICROSCOPIC
BILIRUBIN URINE: NEGATIVE
GLUCOSE, UA: NEGATIVE
HGB URINE DIPSTICK: NEGATIVE
KETONES UR: NEGATIVE
Nitrite: NEGATIVE
PH: 6 (ref 5.0–8.0)
PROTEIN: NEGATIVE
Specific Gravity, Urine: <1.005 (ref 1.001–1.035)

## 2016-02-21 LAB — URINALYSIS, MICROSCOPIC ONLY
Casts: NONE SEEN [LPF]
Crystals: NONE SEEN [HPF]
RBC / HPF: NONE SEEN RBC/HPF (ref <=2)
Yeast: NONE SEEN [HPF]

## 2016-02-21 MED ORDER — NITROFURANTOIN MONOHYD MACRO 100 MG PO CAPS: 100.0000 mg | ORAL_CAPSULE | Freq: Two times a day (BID) | ORAL | Status: DC

## 2016-02-21 NOTE — Progress Notes (Signed)
Patient ID: SERGIO DUCHNOWSKI MRN: [id number], DOB: May 27, 1956, 60 y.o. Date of Encounter: 02/21/2016, 11:15 AM    Chief Complaint:  Chief Complaint  Patient presents with  . c/o poss UTI    urgency, LBP, painful urination     HPI: 60 y.o. year old white female presents with above.   Says yesterday she was having significant urinary frequency. Also dysuria. No fever. No back pain.   Also wants refill on Klonipin to use if needed. Says sometimes feels anxiety. Has not refilled this in long time.     Home Meds:   Outpatient Prescriptions Prior to Visit  Medication Sig Dispense Refill  . acetaminophen (TYLENOL) 325 MG tablet Take 650 mg by mouth every 6 (six) hours as needed for mild pain or headache.    . Calcium Carbonate-Vitamin D (CALCIUM + D PO) Take 2 tablets by mouth daily.     . celecoxib (CELEBREX) 200 MG capsule Take 1 capsule (200 mg total) by mouth 2 (two) times daily. 60 capsule 5  . clonazePAM (KLONOPIN) 0.5 MG tablet Take 0.5 mg by mouth 2 (two) times daily as needed for anxiety. Reported on 12/20/2015    . cyclobenzaprine (FLEXERIL) 10 MG tablet TAKE (1) TABLET BY MOUTH (3) TIMES DAILY AS NEEDED FOR MUSCLE SPASMS. 60 tablet 2  . fexofenadine (ALLEGRA) 180 MG tablet Take 180 mg by mouth daily.    . fluticasone (FLONASE) 50 MCG/ACT nasal spray USE 2 SPRAYS IN EACH NOSTRIL ONCE DAILY. 16 g 5  . olmesartan (BENICAR) 20 MG tablet TAKE ONE TABLET BY MOUTH ONCE DAILY. 30 tablet 3  . SYNTHROID 75 MCG tablet TAKE ONE TABLET BY MOUTH ONCE DAILY. 30 tablet 6  . ciprofloxacin (CIPRO) 500 MG tablet Take 1 tablet (500 mg total) by mouth 2 (two) times daily. 10 tablet 0  . triamcinolone (KENALOG) 0.025 % cream Apply 1 application topically 2 (two) times daily. 30 g 0   No facility-administered medications prior to visit.    Allergies: No Known Allergies    Review of Systems: See HPI for pertinent ROS. All other ROS negative.    Physical Exam: Blood pressure 132/86, pulse 60,  temperature 97.5 F (36.4 C), temperature source Oral, resp. rate 18, weight 174 lb (78.926 kg)., Body mass index is 31.06 kg/(m^2). General:  WNWD WF. Appears in no acute distress. Neck: Supple. No thyromegaly. No lymphadenopathy. Lungs: Clear bilaterally to auscultation without wheezes, rales, or rhonchi. Breathing is unlabored. Heart: Regular rhythm. No murmurs, rubs, or gallops. Abdomen: Soft, non-tender, non-distended with normoactive bowel sounds. No hepatomegaly. No rebound/guarding. No obvious abdominal masses. Msk:  Strength and tone normal for age. No costophrenic angle tenderness with percussion bilaterally.  Extremities/Skin: Warm and dry. Neuro: Alert and oriented X 3. Moves all extremities spontaneously. Gait is normal. CNII-XII grossly in tact. Psych:  Responds to questions appropriately with a normal affect.     ASSESSMENT AND PLAN:  60 y.o. year old female with  1. Urinary tract infection, site not specified Reviewed her last urine culture. That was sensitive to Macrobid so will Rx this.  She is to start this immediately and take as directed and complete it. Also will send culture.  - nitrofurantoin, macrocrystal-monohydrate, (MACROBID) 100 MG capsule; Take 1 capsule (100 mg total) by mouth 2 (two) times daily.  Dispense: 10 capsule; Refill: 0 - Urine culture  2. Painful urination - Urinalysis, Routine w reflex microscopic (not at [facility])  Called in Klonopin 0.5mg  1 po QD  PRN  # 30 + 1. She is aware to take sparingly.   Signed, [address] [place], [place], [facility] 02/21/2016 11:15 AM

## 2016-02-22 LAB — URINE CULTURE

## 2016-04-14 ENCOUNTER — Ambulatory Visit (INDEPENDENT_AMBULATORY_CARE_PROVIDER_SITE_OTHER): Payer: Commercial Managed Care - HMO | Admitting: Podiatry

## 2016-04-14 ENCOUNTER — Encounter: Payer: Self-pay | Admitting: Podiatry

## 2016-04-14 ENCOUNTER — Ambulatory Visit (INDEPENDENT_AMBULATORY_CARE_PROVIDER_SITE_OTHER): Payer: Commercial Managed Care - HMO

## 2016-04-14 VITALS — BP 142/93 | HR 76 | Resp 16

## 2016-04-14 DIAGNOSIS — M21619 Bunion of unspecified foot: Secondary | ICD-10-CM

## 2016-04-14 DIAGNOSIS — M79672 Pain in left foot: Secondary | ICD-10-CM | POA: Diagnosis not present

## 2016-04-14 DIAGNOSIS — M722 Plantar fascial fibromatosis: Secondary | ICD-10-CM

## 2016-04-14 MED ORDER — TRIAMCINOLONE ACETONIDE 10 MG/ML IJ SUSP
10.0000 mg | Freq: Once | INTRAMUSCULAR | Status: AC
  Administered 2016-04-14: 10 mg

## 2016-04-14 NOTE — Progress Notes (Signed)
   Subjective:    Patient ID: Yvonne Wu, female    DOB: 1956-04-28, 60 y.o.   MRN: [id number]  HPI    Review of Systems  All other systems reviewed and are negative.      Objective:   Physical Exam        Assessment & Plan:

## 2016-04-14 NOTE — Patient Instructions (Signed)

## 2016-04-15 NOTE — Progress Notes (Signed)
Subjective:     Patient ID: Yvonne Wu, female   DOB: 1956-09-01, 60 y.o.   MRN: [id number]  HPI patient has not been seen a number of years and presents with painful left heel at the insertional point tendon calcaneus with inflammation fluid buildup around the medial band   Review of Systems  All other systems reviewed and are negative.      Objective:   Physical Exam  Constitutional: She is oriented to person, place, and time.  Cardiovascular: Intact distal pulses.   Musculoskeletal: Normal range of motion.  Neurological: She is oriented to person, place, and time.  Skin: Skin is warm.  Nursing note and vitals reviewed.  neurovascular status intact muscle strength adequate range of motion within normal limits with patient found to have exquisite discomfort plantar aspect left heel at the insertional point tendon into the calcaneus with moderate depression of the arch and moderate arch instability. Patient's found have good digital perfusion and is well oriented 3     Assessment:     Acute plantar fasciitis left with inflammation and fluid around the medial band    Plan:     H&P and x-ray reviewed with patient. At this point I went ahead and injected the plantar fascia 3 mg Kenalog 5 mg Xylocaine and applied fascial brace. I discussed long-term orthotics and placed on oral anti-inflammatory physical therapy and supportive shoe gear usage and reappoint him next week  X-ray indicated spur formation with no indications of stress fracture arthritis

## 2016-04-21 ENCOUNTER — Ambulatory Visit (INDEPENDENT_AMBULATORY_CARE_PROVIDER_SITE_OTHER): Payer: Commercial Managed Care - HMO | Admitting: Podiatry

## 2016-04-21 ENCOUNTER — Encounter: Payer: Self-pay | Admitting: Podiatry

## 2016-04-21 VITALS — BP 149/69 | HR 88 | Resp 12

## 2016-04-21 DIAGNOSIS — M79672 Pain in left foot: Secondary | ICD-10-CM | POA: Diagnosis not present

## 2016-04-21 DIAGNOSIS — M722 Plantar fascial fibromatosis: Secondary | ICD-10-CM

## 2016-04-21 DIAGNOSIS — M21619 Bunion of unspecified foot: Secondary | ICD-10-CM | POA: Diagnosis not present

## 2016-04-21 NOTE — Progress Notes (Signed)
Subjective:     Patient ID: Yvonne Wu, female   DOB: 1956-08-22, 60 y.o.   MRN: [id number]  HPI patient presents stating the left heel is still bothering her but improved from previous visit   Review of Systems     Objective:   Physical Exam Neurovascular status intact muscle strength adequate range of motion within normal limits with discomfort noted plantar heel left with depression of the arch noted upon weightbearing    Assessment:     Long-term plantar fasciitis with mild to moderate improvement with acute treatment with long-term chronic element to condition and walks on cement floors    Plan:     Reviewed condition and recommended orthotics and reviewed the utilization of a customized type orthotic of a Berkley device. I went ahead today scanned her for orthotics and will have a Berkley type orthotic made for her. Reappoint when finished

## 2016-05-28 ENCOUNTER — Ambulatory Visit: Payer: Commercial Managed Care - HMO | Admitting: *Deleted

## 2016-05-28 DIAGNOSIS — M722 Plantar fascial fibromatosis: Secondary | ICD-10-CM

## 2016-05-28 NOTE — Progress Notes (Signed)
Patient ID: Yvonne Wu, female   DOB: 09-Feb-1956, 60 y.o.   MRN: [id number]  Patient presents for orthotic pick up.  Verbal and written break in and wear instructions given.  Patient will follow up in 4 weeks if symptoms worsen or fail to improve.

## 2016-05-28 NOTE — Patient Instructions (Signed)

## 2016-06-16 ENCOUNTER — Other Ambulatory Visit: Payer: Self-pay | Admitting: Physician Assistant

## 2016-06-16 NOTE — Telephone Encounter (Signed)
Ok to refill 

## 2016-06-16 NOTE — Telephone Encounter (Signed)
Medication called to pharmacy. 

## 2016-06-16 NOTE — Telephone Encounter (Signed)
Approved. #[id number]. 

## 2016-06-23 ENCOUNTER — Other Ambulatory Visit: Payer: Self-pay | Admitting: Physician Assistant

## 2016-07-03 ENCOUNTER — Encounter: Payer: Self-pay | Admitting: Physician Assistant

## 2016-07-03 ENCOUNTER — Ambulatory Visit (INDEPENDENT_AMBULATORY_CARE_PROVIDER_SITE_OTHER): Payer: Commercial Managed Care - HMO | Admitting: Physician Assistant

## 2016-07-03 VITALS — BP 130/78 | HR 59 | Temp 97.6°F | Resp 16 | Wt 180.0 lb

## 2016-07-03 DIAGNOSIS — F419 Anxiety disorder, unspecified: Secondary | ICD-10-CM | POA: Diagnosis not present

## 2016-07-03 DIAGNOSIS — I10 Essential (primary) hypertension: Secondary | ICD-10-CM

## 2016-07-03 DIAGNOSIS — E039 Hypothyroidism, unspecified: Secondary | ICD-10-CM | POA: Diagnosis not present

## 2016-07-03 DIAGNOSIS — M8580 Other specified disorders of bone density and structure, unspecified site: Secondary | ICD-10-CM

## 2016-07-03 LAB — TSH: TSH: 5.67 mIU/L — ABNORMAL HIGH

## 2016-07-03 LAB — BASIC METABOLIC PANEL WITH GFR
BUN: 12 mg/dL (ref 7–25)
CALCIUM: 9.2 mg/dL (ref 8.6–10.4)
CHLORIDE: 105 mmol/L (ref 98–110)
CO2: 26 mmol/L (ref 20–31)
CREATININE: 0.7 mg/dL (ref 0.50–1.05)
GFR, Est African American: >89 mL/min (ref >=60)
GFR, Est Non African American: >89 mL/min (ref >=60)
Glucose, Bld: 78 mg/dL (ref 70–99)
Potassium: 4.2 mmol/L (ref 3.5–5.3)
SODIUM: 141 mmol/L (ref 135–146)

## 2016-07-03 MED ORDER — DESONIDE CREA-MOISTURIZING LOT 0.05 % EX KIT: PACK | CUTANEOUS | 3 refills | Status: DC

## 2016-07-03 NOTE — Progress Notes (Signed)
Patient ID: Yvonne Wu MRN: [id number], DOB: 1956-05-23, 60 y.o. Date of Encounter: 07/03/2016,   Chief Complaint: Routine followup of blood pressure, thyroid. Also having area of itch skin near eye.  HPI: 60 y.o. y/o white female  here for above.  (NOTE: I have been seeing her for years. She was having situational anxiety and depression because her husband had cancer--leukemia. He died of Leukemia 2013/01/31.)  At visit May 2015 she had told me that she had gone off of her Wellbutrin and was feeling fine without the medication in regards to anxiety and depression symptoms.  Also reported that she is rarely needing her clonazepam.  Today she reports that all of this is remained the same. Says that she is feeling okay in regards to mood.   At [place] 12/2014 she was in the process of moving into a new house. Said that after her husband passed away, she needed to down size and get rid of a lot of stuff.  Built a new house near her mom. Her daughter and grandchildren live with patient. Pt states that her daughter has bipolar and that she "probably always will need to live with me " --More recently, this daughter and grandson have become patients of mine so I have gotten to know them as well.  She is taking blood pressure medication as directed with no adverse effects.  She is taking her thyroid medication as directed.  At [place] 10/11/2015: Says she gets this area of itchy irritated skin occasionally. Applies moisturizer and it gets better but says that I had given her some steroid cream in the past and she is needing a refill on that. At [place] 06/2016--says she saw a specialist and is using this cream, which is working well---Desonide Cream 0.05%---Apply to affected area BID x 7 days  At [place] 10/11/2015: She is also requesting an increase in her dose of Celebrex. Says that she is on her feet all day and her feet hurt. Also occasional back pain and shoulder pain.  No other complaints or concerns  today.  Review of Systems: Consitutional: No fever, chills, fatigue, night sweats, lymphadenopathy. No significant/unexplained weight changes. Eyes: No visual changes, eye redness, or discharge. ENT/Mouth: No ear pain, sore throat, nasal drainage, or sinus pain. Cardiovascular: No chest pressure,heaviness, tightness or squeezing, even with exertion. No increased shortness of breath or dyspnea on exertion.No palpitations, edema, orthopnea, PND. Respiratory: No cough, hemoptysis, SOB, or wheezing. Gastrointestinal: No anorexia, dysphagia, reflux, pain, nausea, vomiting, hematemesis, diarrhea, constipation, BRBPR, or melena. Breast: No mass, nodules, bulging, or retraction. No skin changes or inflammation. No nipple discharge. No lymphadenopathy. Genitourinary: No dysuria, hematuria, incontinence, vaginal discharge, pruritis, burning, abnormal bleeding, or pain. Musculoskeletal: No decreased ROM, No joint pain or swelling. No significant pain in neck. Skin: No rash, pruritis, or concerning lesions. Neurological: No headache, dizziness, syncope, seizures, tremors, memory loss, coordination problems, or paresthesias. Psychological: No anxiety, depression, hallucinations, SI/HI. Endocrine: No polydipsia, polyphagia, polyuria, or known diabetes.No increased fatigue. No palpitations/rapid heart rate. No significant/unexplained weight change. All other systems were reviewed and are otherwise negative.  Past Medical History:  Diagnosis Date  . Anxiety   . Depression    situational  . Hypertension   . Hypothyroid   . Urinary, incontinence, stress female 02/15/2015     Past Surgical History:  Procedure Laterality Date  . BLADDER SUSPENSION N/A 02/15/2015   Procedure: TRANSVAGINAL TAPE (TVT) Mid-Urethral Sling PROCEDURE;  Surgeon: Azucena Fallen, MD;  Location: [facility];  Service: Gynecology;  Laterality: N/A;  2 hrs.  . BUNIONECTOMY Left   . CYSTOCELE REPAIR N/A 02/15/2015   Procedure: ANTERIOR  REPAIR (CYSTOCELE)/Colporrhaphy;  Surgeon: Azucena Fallen, MD;  Location: [facility];  Service: Gynecology;  Laterality: N/A;  . CYSTOSCOPY N/A 02/15/2015   Procedure: CYSTOSCOPY;  Surgeon: Azucena Fallen, MD;  Location: [facility];  Service: Gynecology;  Laterality: N/A;  . TUBAL LIGATION      Home Meds:  Current Outpatient Prescriptions on File Prior to Visit  Medication Sig Dispense Refill  . Calcium Carbonate-Vitamin D (CALCIUM + D PO) Take 2 tablets by mouth daily. Reported on 04/14/2016    . celecoxib (CELEBREX) 200 MG capsule Take 1 capsule (200 mg total) by mouth 2 (two) times daily. 60 capsule 5  . clonazePAM (KLONOPIN) 0.5 MG tablet TAKE (1) TABLET BY MOUTH ONCE DAILY AS NEEDED. 30 tablet 2  . fexofenadine (ALLEGRA) 180 MG tablet Take 180 mg by mouth daily. Reported on 04/14/2016    . fluticasone (FLONASE) 50 MCG/ACT nasal spray USE 2 SPRAYS IN EACH NOSTRIL ONCE DAILY. 16 g 5  . olmesartan (BENICAR) 20 MG tablet TAKE ONE TABLET BY MOUTH ONCE DAILY. 30 tablet 0  . SYNTHROID 75 MCG tablet TAKE ONE TABLET BY MOUTH ONCE DAILY. 30 tablet 6  . acetaminophen (TYLENOL) 325 MG tablet Take 650 mg by mouth every 6 (six) hours as needed for mild pain or headache. Reported on 04/14/2016    . cyclobenzaprine (FLEXERIL) 10 MG tablet TAKE (1) TABLET BY MOUTH (3) TIMES DAILY AS NEEDED FOR MUSCLE SPASMS. (Patient not taking: Reported on 07/03/2016) 60 tablet 2  . nitrofurantoin, macrocrystal-monohydrate, (MACROBID) 100 MG capsule Take 1 capsule (100 mg total) by mouth 2 (two) times daily. (Patient not taking: Reported on 07/03/2016) 10 capsule 0   No current facility-administered medications on file prior to visit.     Allergies: No Known Allergies  Social History   Social History  . Marital status: Widowed    Spouse name: N/A  . Number of children: N/A  . Years of education: N/A   Occupational History  . Not on file.   Social History Main Topics  . Smoking status: Former Smoker    Quit date: 02/22/1995   . Smokeless tobacco: Never Used  . Alcohol use No  . Drug use: No  . Sexual activity: Not Currently   Other Topics Concern  . Not on file   Social History Narrative   Works at [employer].   Involves a lot of standing and bending.   Husband passed away in 2013/02/17 with leukemia.   Patient states that her daughter is age 75 (as of 29) and has bipolar. This daughter lives with her.   This daughter has a child who is 30 years old and another child he was 72 years old. They all live with the patient.   Patient also has a son who lives in [place].          Family History  Problem Relation Age of Onset  . Asthma Mother   . Mental illness Mother   . Cancer Father 62    lung cancer    Physical Exam: Blood pressure 130/78, pulse (!) 59, temperature 97.6 F (36.4 C), resp. rate 16, weight 180 lb (81.6 kg)., Body mass index is 32.14 kg/m. General: Well developed, well nourished,WF. Appears  in no acute distress. Neck: Supple. Trachea midline. No thyromegaly. Full ROM. No lymphadenopathy.No Carotid Bruits. Lungs: Clear to auscultation bilaterally without  wheezes, rales, or rhonchi. Breathing is of normal effort and unlabored. Cardiovascular: RRR with S1 S2. No murmurs, rubs, or gallops. Distal pulses 2+ symmetrically. No carotid or abdominal bruits. Abdomen: Soft, non-tender, non-distended with normoactive bowel sounds. No hepatosplenomegaly or masses. No rebound/guarding. No CVA tenderness. No hernias.  Musculoskeletal: Full range of motion and 5/5 strength throughout.  Skin: Warm and moist without erythema, ecchymosis, wounds, or rash. Neuro: A+Ox3. CN II-XII grossly intact. Moves all extremities spontaneously. Full sensation throughout. Normal gait.  Psych:  Responds to questions appropriately with a normal affect.   Assessment/Plan:  1. Anxiety She is now stable off of her Wellbutrin. Will continue to have clonazepam available if needed.  2. Depression This is stable and  resolved and is now off of Wellbutrin. Situational given her husband with cancer and death.  3. Hypertension Blood Pressure is at goal. Continue current medication. Check labs monitor. - COMPLETE METABOLIC PANEL WITH GFR  4. Hypothyroid She is taking thyroid medication daily. Check lab to monitor. - TSH  5. Vitamin D deficiency - She is taking vitamin D supplements.  6. Favorable lipid status. She had a complete physical exam and fasting labs 07/2013. Lipids were good with LDL 128 and all other parameters normal.    Low back pain, Pain in Feet, Pain in Shoulder At prior OV--- Increased Celebrex to twice a day dosing.   Preventive Care:   B. Pap- Per Gyn C. Mammogram: Per Gyn D. DEXA: Per Gyn E. screening colonoscopy: This was done December 2008. Normal. Repeat 10 years.-------------------------------------------------------WILL BE DUE TO DISCUSS THIS AT NEXT OV----------------------- F. Immunizations: Influenza---N/A today Tetanus vaccine: She says that she had a finger laceration --in 2008-- this should have been updated at that time 2008---------------WILL BE DUE TO DISCUSS THIS AT NEXT OV----------------------------------- Pneumovax--Willl give it age 54. He does not smoke. She has no indication to need this until age 74. Zostavax --discuss at  age 55------------------------------------------------------------------------------------------------------------------------------WILL BE DUE TO DISCUSS THIS AT NEXT OV-----------------------   ROV 6 months, sooner if needed.  Signed, [address] [place], [place], [facility] 07/03/2016 8:41 AM

## 2016-07-07 ENCOUNTER — Other Ambulatory Visit: Payer: Self-pay

## 2016-07-07 DIAGNOSIS — E039 Hypothyroidism, unspecified: Secondary | ICD-10-CM

## 2016-07-21 ENCOUNTER — Other Ambulatory Visit: Payer: Self-pay | Admitting: Physician Assistant

## 2016-07-21 NOTE — Telephone Encounter (Signed)
Rx filled per protocol  

## 2016-08-18 ENCOUNTER — Other Ambulatory Visit: Payer: Self-pay | Admitting: Physician Assistant

## 2016-10-06 ENCOUNTER — Other Ambulatory Visit: Payer: Commercial Managed Care - HMO

## 2016-10-06 DIAGNOSIS — E039 Hypothyroidism, unspecified: Secondary | ICD-10-CM

## 2016-10-07 LAB — TSH: TSH: 5.73 mIU/L — ABNORMAL HIGH

## 2016-10-08 ENCOUNTER — Other Ambulatory Visit: Payer: Self-pay

## 2016-10-08 ENCOUNTER — Telehealth: Payer: Self-pay

## 2016-10-08 DIAGNOSIS — E039 Hypothyroidism, unspecified: Secondary | ICD-10-CM

## 2016-10-08 MED ORDER — LEVOTHYROXINE SODIUM 88 MCG PO TABS: 88.0000 ug | ORAL_TABLET | Freq: Every day | ORAL | 1 refills | Status: DC

## 2016-10-08 NOTE — Telephone Encounter (Signed)
-----   Message from Orlena Sheldon, PA-C sent at 10/08/2016  7:59 AM EST ----- Current thyroid dose is 75 g daily. Increase to 88 g daily--#30 + 1. Recheck TSH 6 weeks.

## 2016-10-08 NOTE — Telephone Encounter (Signed)
Spoke with pt provided lab results as well as ordered lab for future TSH

## 2016-10-09 ENCOUNTER — Ambulatory Visit (INDEPENDENT_AMBULATORY_CARE_PROVIDER_SITE_OTHER): Payer: Commercial Managed Care - HMO | Admitting: Physician Assistant

## 2016-10-09 ENCOUNTER — Encounter: Payer: Self-pay | Admitting: Physician Assistant

## 2016-10-09 VITALS — BP 128/78 | HR 66 | Temp 97.8°F | Resp 16 | Wt 170.0 lb

## 2016-10-09 DIAGNOSIS — F419 Anxiety disorder, unspecified: Secondary | ICD-10-CM | POA: Diagnosis not present

## 2016-10-09 DIAGNOSIS — F331 Major depressive disorder, recurrent, moderate: Secondary | ICD-10-CM

## 2016-10-09 MED ORDER — BUPROPION HCL ER (SR) 150 MG PO TB12: ORAL_TABLET | ORAL | 5 refills | Status: DC

## 2016-10-09 NOTE — Progress Notes (Signed)
 Patient ID: Yvonne Wu MRN: [id number], DOB: 10/11/1956, 60 y.o. Date of Encounter: 10/09/2016,   Chief Complaint: Routine followup of blood pressure, thyroid. Also having area of itch skin near eye.  HPI: 60 y.o. y/o white female  here for above.  (NOTE: I have been seeing her for years. She was having situational anxiety and depression because her husband had cancer--leukemia. He died of Leukemia April, 2014.)  At visit May 2015 she had told me that she had gone off of her Wellbutrin and was feeling fine without the medication in regards to anxiety and depression symptoms.  Also reported that she is rarely needing her clonazepam.  Today she reports that all of this is remained the same. Says that she is feeling okay in regards to mood.   At [place] 12/2014 she was in the process of moving into a new house. Said that after her husband passed away, she needed to down size and get rid of a lot of stuff.  Built a new house near her mom. Her daughter and grandchildren live with patient. Pt states that her daughter has bipolar and that she "probably always will need to live with me " --More recently, this daughter and grandson have become patients of mine so I have gotten to know them as well.   At [place] 10/11/2015: Says she gets this area of itchy irritated skin occasionally. Applies moisturizer and it gets better but says that I had given her some steroid cream in the past and she is needing a refill on that. At [place] 06/2016--says she saw a specialist and is using this cream, which is working well---Desonide Cream 0.05%---Apply to affected area BID x 7 days  At [place] 10/11/2015: She is also requesting an increase in her dose of Celebrex. Says that she is on her feet all day and her feet hurt. Also occasional back pain and shoulder pain.  She is taking her blood pressure medication daily as directed. No lightheadedness or other adverse effects. She is taking her thyroid medication daily as directed.  No unexplained changes in weight. No palpitations. No changes in hair or skin.  10/09/2016: Today she is here just to discuss anxiety and depression. As stated above she had been on Wellbutrin in the past during the time that her husband was ill and passed away. However after that she had discontinued the Wellbutrin and was feeling fine off of that medication. Today she states that she has been feeling symptoms of anxiety and depression recently. Says that her granddaughter had a baby. Her granddaughter is 21 years old but patient says that "she has her own problems with her nerves "also says that she had problems with postpartum depression and now has been found to be anemic and is having bruises all over her body and is going to see Hematologist.   As well she will call patient and ask her to help watch the baby. Says that she ends up watching the baby, then has to go to work. Says "it's just too much for somebody at my age". Also says "I just can't help but worry". Also says that she had to put her dog down last week. She gets teary-eyed talking about this. Says that she had the dog for 13 years. Also says that her husband "loved that dog" and " a lot of memories with my husband with that dog"----all of these memories went through her mind with the dog dying.  Review of Systems: Consitutional: No fever,   chills, fatigue, night sweats, lymphadenopathy. No significant/unexplained weight changes. Eyes: No visual changes, eye redness, or discharge. ENT/Mouth: No ear pain, sore throat, nasal drainage, or sinus pain. Cardiovascular: No chest pressure,heaviness, tightness or squeezing, even with exertion. No increased shortness of breath or dyspnea on exertion.No palpitations, edema, orthopnea, PND. Respiratory: No cough, hemoptysis, SOB, or wheezing. Gastrointestinal: No anorexia, dysphagia, reflux, pain, nausea, vomiting, hematemesis, diarrhea, constipation, BRBPR, or melena. Breast: No mass, nodules,  bulging, or retraction. No skin changes or inflammation. No nipple discharge. No lymphadenopathy. Genitourinary: No dysuria, hematuria, incontinence, vaginal discharge, pruritis, burning, abnormal bleeding, or pain. Musculoskeletal: No decreased ROM, No joint pain or swelling. No significant pain in neck. Skin: No rash, pruritis, or concerning lesions. Neurological: No headache, dizziness, syncope, seizures, tremors, memory loss, coordination problems, or paresthesias. Psychological: No anxiety, depression, hallucinations, SI/HI. Endocrine: No polydipsia, polyphagia, polyuria, or known diabetes.No increased fatigue. No palpitations/rapid heart rate. No significant/unexplained weight change. All other systems were reviewed and are otherwise negative.  Past Medical History:  Diagnosis Date  . Anxiety   . Depression    situational  . Hypertension   . Hypothyroid   . Urinary, incontinence, stress female 02/15/2015     Past Surgical History:  Procedure Laterality Date  . BLADDER SUSPENSION N/A 02/15/2015   Procedure: TRANSVAGINAL TAPE (TVT) Mid-Urethral Sling PROCEDURE;  Surgeon: Vaishali Mody, MD;  Location: [facility];  Service: Gynecology;  Laterality: N/A;  2 hrs.  . BUNIONECTOMY Left   . CYSTOCELE REPAIR N/A 02/15/2015   Procedure: ANTERIOR REPAIR (CYSTOCELE)/Colporrhaphy;  Surgeon: Vaishali Mody, MD;  Location: [facility];  Service: Gynecology;  Laterality: N/A;  . CYSTOSCOPY N/A 02/15/2015   Procedure: CYSTOSCOPY;  Surgeon: Vaishali Mody, MD;  Location: [facility];  Service: Gynecology;  Laterality: N/A;  . TUBAL LIGATION      Home Meds:  Current Outpatient Prescriptions on File Prior to Visit  Medication Sig Dispense Refill  . acetaminophen (TYLENOL) 325 MG tablet Take 650 mg by mouth every 6 (six) hours as needed for mild pain or headache. Reported on 04/14/2016    . Calcium Carbonate-Vitamin D (CALCIUM + D PO) Take 2 tablets by mouth daily. Reported on 04/14/2016    . celecoxib (CELEBREX) 200  MG capsule Take 1 capsule (200 mg total) by mouth 2 (two) times daily. 60 capsule 5  . clonazePAM (KLONOPIN) 0.5 MG tablet TAKE (1) TABLET BY MOUTH ONCE DAILY AS NEEDED. 30 tablet 2  . Desonide Crea-Moisturizing Lot 0.05 % KIT Apply to affected areas twice a day for 7 days 1 each 3  . fexofenadine (ALLEGRA) 180 MG tablet Take 180 mg by mouth daily. Reported on 04/14/2016    . fluticasone (FLONASE) 50 MCG/ACT nasal spray USE 2 SPRAYS IN EACH NOSTRIL ONCE DAILY. 16 g 0  . levothyroxine (SYNTHROID, LEVOTHROID) 88 MCG tablet Take 1 tablet (88 mcg total) by mouth daily. 30 tablet 1  . olmesartan (BENICAR) 20 MG tablet TAKE ONE TABLET BY MOUTH ONCE DAILY. 30 tablet 3  . cyclobenzaprine (FLEXERIL) 10 MG tablet TAKE (1) TABLET BY MOUTH (3) TIMES DAILY AS NEEDED FOR MUSCLE SPASMS. (Patient not taking: Reported on 10/09/2016) 60 tablet 2  . nitrofurantoin, macrocrystal-monohydrate, (MACROBID) 100 MG capsule Take 1 capsule (100 mg total) by mouth 2 (two) times daily. (Patient not taking: Reported on 10/09/2016) 10 capsule 0   No current facility-administered medications on file prior to visit.     Allergies: No Known Allergies  Social History   Social History  . Marital   status: Widowed    Spouse name: N/A  . Number of children: N/A  . Years of education: N/A   Occupational History  . Not on file.   Social History Main Topics  . Smoking status: Former Smoker    Quit date: 02/22/1995  . Smokeless tobacco: Never Used  . Alcohol use No  . Drug use: No  . Sexual activity: Not Currently   Other Topics Concern  . Not on file   Social History Narrative   Works at [employer].   Involves a lot of standing and bending.   Husband passed away in April 2014 with leukemia.   Patient states that her daughter is age 35 (as of 2014) and has bipolar. This daughter lives with her.   This daughter has a child who is 18 years old and another child he was 8 years old. They all live with the patient.   Patient  also has a son who lives in [place].          Family History  Problem Relation Age of Onset  . Asthma Mother   . Mental illness Mother   . Cancer Father 67    lung cancer    Physical Exam: Blood pressure 128/78, pulse 66, temperature 97.8 F (36.6 C), temperature source Oral, resp. rate 16, weight 170 lb (77.1 kg), SpO2 98 %., Body mass index is 30.35 kg/m. General: Well developed, well nourished,WF. Appears  in no acute distress. Neck: Supple. Trachea midline. No thyromegaly. Full ROM. No lymphadenopathy.No Carotid Bruits. Lungs: Clear to auscultation bilaterally without wheezes, rales, or rhonchi. Breathing is of normal effort and unlabored. Cardiovascular: RRR with S1 S2. No murmurs, rubs, or gallops. Distal pulses 2+ symmetrically. No carotid or abdominal bruits. Abdomen: Soft, non-tender, non-distended with normoactive bowel sounds. No hepatosplenomegaly or masses. No rebound/guarding. No CVA tenderness. No hernias.  Musculoskeletal: Full range of motion and 5/5 strength throughout.  Skin: Warm and moist without erythema, ecchymosis, wounds, or rash. Neuro: A+Ox3. CN II-XII grossly intact. Moves all extremities spontaneously. Full sensation throughout. Normal gait.  Psych:  Responds to questions appropriately with a normal affect.   Assessment/Plan:  10/09/2016:  1. Anxiety She has used Wellbutrin in the past and this worked well for her--- controlling symptoms and causing no adverse effects so will restart Wellbutrin at this time.  Reminded her that it may take up to several weeks for this to start to take effect and for her to see improvement.  F/U with me if develops any adverse effects or if symptoms not controlled in 6 weeks.  O/W can wait to follow-up 3 months when she is due for her next routine visit. - buPROPion (WELLBUTRIN SR) 150 MG 12 hr tablet; Take 1 daily for 5 days then take 1 twice a day  Dispense: 60 tablet; Refill: 5  2. Moderate episode of recurrent major  depressive disorder (HCC) She has used Wellbutrin in the past and this worked well for her--- controlling symptoms and causing no adverse effects so will restart Wellbutrin at this time.  Reminded her that it may take up to several weeks for this to start to take effect and for her to see improvement.  F/U with me if develops any adverse effects or if symptoms not controlled in 6 weeks.  O/W can wait to follow-up 3 months when she is due for her next routine visit.  - buPROPion (WELLBUTRIN SR) 150 MG 12 hr tablet; Take 1 daily for 5 days then take 1   twice a day  Dispense: 60 tablet; Refill: 5     THE FOLLOWING ARE COPIED FROM PRIOR OV---BUT NOT ADDRESSED AT OV 10/09/2016:   Hypertension Blood Pressure is at goal. Continue current medication. Check labs monitor. - COMPLETE METABOLIC PANEL WITH GFR  Hypothyroid She is taking thyroid medication daily. Check lab to monitor. - TSH  Vitamin D deficiency - She is taking vitamin D supplements.  Favorable lipid status. She had a complete physical exam and fasting labs 07/2013. Lipids were good with LDL 128 and all other parameters normal.    Low back pain, Pain in Feet, Pain in Shoulder At prior OV--- Increased Celebrex to twice a day dosing.   Preventive Care:   B. Pap- Per Gyn C. Mammogram: Per Gyn D. DEXA: Per Gyn E. screening colonoscopy: This was done December 2008. Normal. Repeat 10 years.-------------------------------------------------------WILL BE DUE TO DISCUSS THIS AT NEXT OV----------------------- F. Immunizations: Influenza---N/A today Tetanus vaccine: She says that she had a finger laceration --in 2008-- this should have been updated at that time 2008---------------WILL BE DUE TO DISCUSS THIS AT NEXT OV----------------------------------- Pneumovax--Willl give it age 65. He does not smoke. She has no indication to need this until age 65. Zostavax --discuss at  age  60------------------------------------------------------------------------------------------------------------------------------WILL BE DUE TO DISCUSS THIS AT NEXT OV-----------------------   ROV 6 months, sooner if needed.  Signed, Mary Beth Dixon, PA, BSFM 10/09/2016 11:21 AM    

## 2016-10-10 ENCOUNTER — Other Ambulatory Visit: Payer: Self-pay | Admitting: Physician Assistant

## 2016-10-10 DIAGNOSIS — M545 Low back pain, unspecified: Secondary | ICD-10-CM

## 2016-10-10 NOTE — Telephone Encounter (Signed)
Rx filled per protocol  

## 2016-10-24 DIAGNOSIS — J301 Allergic rhinitis due to pollen: Secondary | ICD-10-CM | POA: Diagnosis not present

## 2016-10-24 DIAGNOSIS — J3089 Other allergic rhinitis: Secondary | ICD-10-CM | POA: Diagnosis not present

## 2016-10-31 ENCOUNTER — Other Ambulatory Visit: Payer: Self-pay | Admitting: Physician Assistant

## 2016-10-31 DIAGNOSIS — J301 Allergic rhinitis due to pollen: Secondary | ICD-10-CM | POA: Diagnosis not present

## 2016-10-31 DIAGNOSIS — J3089 Other allergic rhinitis: Secondary | ICD-10-CM | POA: Diagnosis not present

## 2016-10-31 DIAGNOSIS — G8929 Other chronic pain: Secondary | ICD-10-CM | POA: Diagnosis not present

## 2016-10-31 DIAGNOSIS — M5442 Lumbago with sciatica, left side: Secondary | ICD-10-CM | POA: Diagnosis not present

## 2016-11-13 DIAGNOSIS — J301 Allergic rhinitis due to pollen: Secondary | ICD-10-CM | POA: Diagnosis not present

## 2016-11-13 DIAGNOSIS — J3089 Other allergic rhinitis: Secondary | ICD-10-CM | POA: Diagnosis not present

## 2016-11-18 ENCOUNTER — Other Ambulatory Visit: Payer: Commercial Managed Care - HMO

## 2016-11-18 DIAGNOSIS — E039 Hypothyroidism, unspecified: Secondary | ICD-10-CM

## 2016-11-18 LAB — TSH: TSH: 0.45 m[IU]/L

## 2016-11-19 ENCOUNTER — Other Ambulatory Visit: Payer: Self-pay

## 2016-11-20 DIAGNOSIS — J3089 Other allergic rhinitis: Secondary | ICD-10-CM | POA: Diagnosis not present

## 2016-11-20 DIAGNOSIS — J301 Allergic rhinitis due to pollen: Secondary | ICD-10-CM | POA: Diagnosis not present

## 2016-11-25 ENCOUNTER — Other Ambulatory Visit: Payer: Self-pay | Admitting: Physician Assistant

## 2016-11-28 DIAGNOSIS — J301 Allergic rhinitis due to pollen: Secondary | ICD-10-CM | POA: Diagnosis not present

## 2016-11-28 DIAGNOSIS — J3089 Other allergic rhinitis: Secondary | ICD-10-CM | POA: Diagnosis not present

## 2016-12-05 DIAGNOSIS — J3089 Other allergic rhinitis: Secondary | ICD-10-CM | POA: Diagnosis not present

## 2016-12-05 DIAGNOSIS — J301 Allergic rhinitis due to pollen: Secondary | ICD-10-CM | POA: Diagnosis not present

## 2016-12-08 ENCOUNTER — Other Ambulatory Visit: Payer: Self-pay | Admitting: Physician Assistant

## 2016-12-08 NOTE — Telephone Encounter (Signed)
Rx filled per protocol  

## 2016-12-10 DIAGNOSIS — L820 Inflamed seborrheic keratosis: Secondary | ICD-10-CM | POA: Diagnosis not present

## 2016-12-10 DIAGNOSIS — D229 Melanocytic nevi, unspecified: Secondary | ICD-10-CM | POA: Diagnosis not present

## 2016-12-10 DIAGNOSIS — L309 Dermatitis, unspecified: Secondary | ICD-10-CM | POA: Diagnosis not present

## 2016-12-10 DIAGNOSIS — D492 Neoplasm of unspecified behavior of bone, soft tissue, and skin: Secondary | ICD-10-CM | POA: Diagnosis not present

## 2016-12-12 DIAGNOSIS — J3089 Other allergic rhinitis: Secondary | ICD-10-CM | POA: Diagnosis not present

## 2016-12-12 DIAGNOSIS — J301 Allergic rhinitis due to pollen: Secondary | ICD-10-CM | POA: Diagnosis not present

## 2016-12-25 DIAGNOSIS — J301 Allergic rhinitis due to pollen: Secondary | ICD-10-CM | POA: Diagnosis not present

## 2016-12-25 DIAGNOSIS — J3089 Other allergic rhinitis: Secondary | ICD-10-CM | POA: Diagnosis not present

## 2016-12-26 ENCOUNTER — Telehealth: Payer: Self-pay | Admitting: Family Medicine

## 2016-12-26 MED ORDER — OSELTAMIVIR PHOSPHATE 75 MG PO CAPS: 75.0000 mg | ORAL_CAPSULE | Freq: Two times a day (BID) | ORAL | 0 refills | Status: DC

## 2016-12-26 NOTE — Telephone Encounter (Signed)
Ok with tamiflu 75 bid for 5 days

## 2016-12-26 NOTE — Telephone Encounter (Signed)
Tamiflu to [facility], pt aware.

## 2016-12-26 NOTE — Telephone Encounter (Signed)
Body aches, weakness, headache since last night.  Nausea no vomiting.  Chills on and off.  Thinks she has flu.  Can she have some Tamiflu?

## 2016-12-29 ENCOUNTER — Telehealth: Payer: Self-pay

## 2016-12-29 NOTE — Telephone Encounter (Signed)
Patient was not able to be seen on 3-9 so Tamiflu was called in and patient is going to have a form faxed to the office to cover her for the days that she has missed from work.  Told patient she should be fine to go back to work on 3-14 if she is feeling better.

## 2016-12-31 ENCOUNTER — Ambulatory Visit (INDEPENDENT_AMBULATORY_CARE_PROVIDER_SITE_OTHER): Payer: Commercial Managed Care - HMO | Admitting: Physician Assistant

## 2016-12-31 ENCOUNTER — Telehealth: Payer: Self-pay

## 2016-12-31 ENCOUNTER — Encounter: Payer: Self-pay | Admitting: Physician Assistant

## 2016-12-31 VITALS — BP 116/78 | HR 80 | Temp 98.2°F | Resp 18 | Wt 172.4 lb

## 2016-12-31 DIAGNOSIS — M8580 Other specified disorders of bone density and structure, unspecified site: Secondary | ICD-10-CM

## 2016-12-31 DIAGNOSIS — F419 Anxiety disorder, unspecified: Secondary | ICD-10-CM | POA: Diagnosis not present

## 2016-12-31 DIAGNOSIS — R110 Nausea: Secondary | ICD-10-CM

## 2016-12-31 DIAGNOSIS — E038 Other specified hypothyroidism: Secondary | ICD-10-CM

## 2016-12-31 DIAGNOSIS — I10 Essential (primary) hypertension: Secondary | ICD-10-CM

## 2016-12-31 LAB — BASIC METABOLIC PANEL WITH GFR
BUN: 13 mg/dL (ref 7–25)
CALCIUM: 9.4 mg/dL (ref 8.6–10.4)
CHLORIDE: 102 mmol/L (ref 98–110)
CO2: 25 mmol/L (ref 20–31)
CREATININE: 0.77 mg/dL (ref 0.50–0.99)
GFR, EST NON AFRICAN AMERICAN: 84 mL/min (ref >=60)
GFR, Est African American: >89 mL/min (ref >=60)
Glucose, Bld: 95 mg/dL (ref 70–99)
Potassium: 4.3 mmol/L (ref 3.5–5.3)
SODIUM: 137 mmol/L (ref 135–146)

## 2016-12-31 LAB — TSH: TSH: 1.53 mIU/L

## 2016-12-31 MED ORDER — PROMETHAZINE HCL 25 MG PO TABS: 25.0000 mg | ORAL_TABLET | Freq: Three times a day (TID) | ORAL | 0 refills | Status: DC | PRN

## 2016-12-31 NOTE — Progress Notes (Signed)
Patient ID: RMONI KEPLINGER MRN: [id number], DOB: [birth year], 61 y.o. Date of Encounter: 12/31/2016,   Chief Complaint: Routine followup of blood pressure, thyroid.   HPI: 61 y.o. y/o white female  here for above.  (NOTE: I have been seeing her for years. She was having situational anxiety and depression because her husband had cancer--leukemia. He died of Leukemia 10-Mar-2013.)  At visit May 2015 she had told me that she had gone off of her Wellbutrin and was feeling fine without the medication in regards to anxiety and depression symptoms.  Also reported that she is rarely needing her clonazepam.  Today she reports that all of this is remained the same. Says that she is feeling okay in regards to mood.   At [place] 12/2014 she was in the process of moving into a new house. Said that after her husband passed away, she needed to down size and get rid of a lot of stuff.  Built a new house near her mom. Her daughter and grandchildren live with patient. 61 states that her daughter has bipolar and that she "probably always will need to live with me " --More recently, this daughter and grandson have become patients of mine so I have gotten to know them as well.   At [place] 10/11/2015: Says she gets this area of itchy irritated skin occasionally. Applies moisturizer and it gets better but says that I had given her some steroid cream in the past and she is needing a refill on that. At [place] 06/2016--says she saw a specialist and is using this cream, which is working well---Desonide Cream 0.05%---Apply to affected area BID x 7 days  At [place] 10/11/2015: She is also requesting an increase in her dose of Celebrex. Says that she is on her feet all day and her feet hurt. Also occasional back pain and shoulder pain.  She is taking her blood pressure medication daily as directed. No lightheadedness or other adverse effects. She is taking her thyroid medication daily as directed. No unexplained changes in weight. No  palpitations. No changes in hair or skin.  10/09/2016: Today she is here just to discuss anxiety and depression. As stated above she had been on Wellbutrin in the past during the time that her husband was ill and passed away. However after that she had discontinued the Wellbutrin and was feeling fine off of that medication. Today she states that she has been feeling symptoms of anxiety and depression recently. Says that her granddaughter had a baby. Her granddaughter is 45 years old but patient says that "she has her own problems with her nerves "also says that she had problems with postpartum depression and now has been found to be anemic and is having bruises all over her body and is going to see Hematologist.   As well she will call patient and ask her to help watch the baby. Says that she ends up watching the baby, then has to go to work. Says "it's just too much for somebody at my age". Also says "I just can't help but worry". Also says that she had to put her dog down last week. She gets teary-eyed talking about this. Says that she had the dog for 13 years. Also says that her husband "loved that dog" and " a lot of memories with my husband with that dog"----all of these memories went through her mind with the dog dying.  12/31/2016: Reports that last week she called here and Dr. Dennard Schaumann felt that  she had influenza and called her in treatment dose of Tamiflu. States that she just completed this yesterday. Says the symptoms started last Thursday night which would have been 12/25/16. Says at that time she had 2 episodes of diarrhea. Then developed severe body aches, chills, and nausea but no vomiting. Has had no further diarrhea. Also had bad headache and her nostrils burned. Felt some drainage in her throat and upper chest region but no deep chest congestion and not no mucus from the nose. Says her nostrils have felt like they were burning so she has started using some nasal saline. Still having some  headache so taking Tylenol. Still feels weak has had no appetite and therefore has eaten very little and even hasn't wanted to drink much of anything. Getting no thick dark mucus from the nose and no thick dark phlegm from the chest. Brought in FMLA paper work for me to complete. Document her out of work starting Thursday 12/25/16 through Friday 01/02/17. Plan to return to work Monday 01/05/17.  Says that she said she has been sick it is been hard to even take pills so she had just stopped the Wellbutrin. Also wasn't sure she still needed it. However after discussing today she says that yes she probably should get back on it even though she has not been feeling as depressed recently and did talk to her granddaughter and told her to stop calling her all upset. Says this has helped, even though she does still worry about her.   Taking BP med as directed. No lightheadedness. Taking thyroid med as directed.   Review of Systems: Consitutional: No fever, chills, fatigue, night sweats, lymphadenopathy. No significant/unexplained weight changes. Eyes: No visual changes, eye redness, or discharge. ENT/Mouth: No ear pain, sore throat, nasal drainage, or sinus pain. Cardiovascular: No chest pressure,heaviness, tightness or squeezing, even with exertion. No increased shortness of breath or dyspnea on exertion.No palpitations, edema, orthopnea, PND. Respiratory: No cough, hemoptysis, SOB, or wheezing. Gastrointestinal: No anorexia, dysphagia, reflux, pain, nausea, vomiting, hematemesis, diarrhea, constipation, BRBPR, or melena. Breast: No mass, nodules, bulging, or retraction. No skin changes or inflammation. No nipple discharge. No lymphadenopathy. Genitourinary: No dysuria, hematuria, incontinence, vaginal discharge, pruritis, burning, abnormal bleeding, or pain. Musculoskeletal: No decreased ROM, No joint pain or swelling. No significant pain in neck. Skin: No rash, pruritis, or concerning lesions. Neurological:  No headache, dizziness, syncope, seizures, tremors, memory loss, coordination problems, or paresthesias. Psychological: No anxiety, depression, hallucinations, SI/HI. Endocrine: No polydipsia, polyphagia, polyuria, or known diabetes.No increased fatigue. No palpitations/rapid heart rate. No significant/unexplained weight change. All other systems were reviewed and are otherwise negative.  Past Medical History:  Diagnosis Date  . Anxiety   . Depression    situational  . Hypertension   . Hypothyroid   . Urinary, incontinence, stress female 02/15/2015     Past Surgical History:  Procedure Laterality Date  . BLADDER SUSPENSION N/A 02/15/2015   Procedure: TRANSVAGINAL TAPE (TVT) Mid-Urethral Sling PROCEDURE;  Surgeon: Azucena Fallen, MD;  Location: [facility];  Service: Gynecology;  Laterality: N/A;  2 hrs.  . BUNIONECTOMY Left   . CYSTOCELE REPAIR N/A 02/15/2015   Procedure: ANTERIOR REPAIR (CYSTOCELE)/Colporrhaphy;  Surgeon: Azucena Fallen, MD;  Location: [facility];  Service: Gynecology;  Laterality: N/A;  . CYSTOSCOPY N/A 02/15/2015   Procedure: CYSTOSCOPY;  Surgeon: Azucena Fallen, MD;  Location: [facility];  Service: Gynecology;  Laterality: N/A;  . TUBAL LIGATION      Home Meds:  Current Outpatient Prescriptions on  File Prior to Visit  Medication Sig Dispense Refill  . acetaminophen (TYLENOL) 325 MG tablet Take 650 mg by mouth every 6 (six) hours as needed for mild pain or headache. Reported on 04/14/2016    . Calcium Carbonate-Vitamin D (CALCIUM + D PO) Take 2 tablets by mouth daily. Reported on 04/14/2016    . celecoxib (CELEBREX) 200 MG capsule TAKE (1) CAPSULE BY MOUTH TWICE DAILY. 60 capsule 3  . clonazePAM (KLONOPIN) 0.5 MG tablet TAKE (1) TABLET BY MOUTH ONCE DAILY AS NEEDED. 30 tablet 2  . fexofenadine (ALLEGRA) 180 MG tablet Take 180 mg by mouth daily. Reported on 04/14/2016    . fluticasone (FLONASE) 50 MCG/ACT nasal spray USE 2 SPRAYS IN EACH NOSTRIL ONCE DAILY. 16 g 5  . olmesartan  (BENICAR) 20 MG tablet TAKE ONE TABLET BY MOUTH ONCE DAILY. 30 tablet 3  . SYNTHROID 88 MCG tablet TAKE ONE TABLET BY MOUTH ONCE DAILY. 30 tablet 0  . buPROPion (WELLBUTRIN SR) 150 MG 12 hr tablet Take 1 daily for 5 days then take 1 twice a day (Patient not taking: Reported on 12/31/2016) 60 tablet 5  . cyclobenzaprine (FLEXERIL) 10 MG tablet TAKE (1) TABLET BY MOUTH (3) TIMES DAILY AS NEEDED FOR MUSCLE SPASMS. (Patient not taking: Reported on 10/09/2016) 60 tablet 2  . oseltamivir (TAMIFLU) 75 MG capsule Take 1 capsule (75 mg total) by mouth 2 (two) times daily. (Patient not taking: Reported on 12/31/2016) 10 capsule 0   No current facility-administered medications on file prior to visit.     Allergies: No Known Allergies  Social History   Social History  . Marital status: Widowed    Spouse name: N/A  . Number of children: N/A  . Years of education: N/A   Occupational History  . Not on file.   Social History Main Topics  . Smoking status: Former Smoker    Quit date: 02/22/1995  . Smokeless tobacco: Never Used  . Alcohol use No  . Drug use: No  . Sexual activity: Not Currently   Other Topics Concern  . Not on file   Social History Narrative   Works at [employer].   Involves a lot of standing and bending.   Husband passed away in 02-21-2013 with leukemia.   Patient states that her daughter is age 4 (as of 60) and has bipolar. This daughter lives with her.   This daughter has a child who is 38 years old and another child he was 19 years old. They all live with the patient.   Patient also has a son who lives in [place].          Family History  Problem Relation Age of Onset  . Asthma Mother   . Mental illness Mother   . Cancer Father 57    lung cancer    Physical Exam: Blood pressure 116/78, pulse 80, temperature 98.2 F (36.8 C), temperature source Oral, resp. rate 18, weight 172 lb 6.4 oz (78.2 kg)., Body mass index is 30.78 kg/m. General: Well developed, well  nourished,WF. Appears  in no acute distress. Neck: Supple. Trachea midline. No thyromegaly. Full ROM. No lymphadenopathy.No Carotid Bruits. Lungs: Clear to auscultation bilaterally without wheezes, rales, or rhonchi. Breathing is of normal effort and unlabored. Cardiovascular: RRR with S1 S2. No murmurs, rubs, or gallops. Distal pulses 2+ symmetrically. No carotid or abdominal bruits. Abdomen: Soft, non-tender, non-distended with normoactive bowel sounds. No hepatosplenomegaly or masses. No rebound/guarding. No CVA tenderness. No hernias.  Musculoskeletal:  Full range of motion and 5/5 strength throughout.  Skin: Warm and moist without erythema, ecchymosis, wounds, or rash. Neuro: A+Ox3. CN II-XII grossly intact. Moves all extremities spontaneously. Full sensation throughout. Normal gait.  Psych:  Responds to questions appropriately with a normal affect.   Assessment/Plan:    1. Anxiety 12/31/2016: Stable/Controlled. Cont current dose of Wellbutrin.   2. Moderate episode of recurrent major depressive disorder ([place]) 12/31/2016: Stable/Controlled. Cont current dose of Wellbutrin.   Hypertension 12/31/2016: Blood Pressure is at goal. Continue current medication. Check labs monitor.  - COMPLETE METABOLIC PANEL WITH GFR  Hypothyroid 12/31/2016: She is taking thyroid medication daily. Check lab to monitor.  - TSH  Vitamin D deficiency 12/31/2016: - She is taking vitamin D supplements.  Favorable lipid status.  She had a complete physical exam and fasting labs 07/2013. Lipids were good with LDL 128 and all other parameters normal.    Low back pain, Pain in Feet, Pain in Shoulder 12/31/2016: Reviewed---At prior OV--- Increased Celebrex to twice a day dosing.   Preventive Care:   B. Pap- Per Gyn C. Mammogram: Per Gyn D. DEXA: Per Gyn                                      E. screening colonoscopy: This was done December 2008. Normal. Repeat 10 years.--------------------------------WILL BE DUE  TO DISCUSS THIS AT NEXT OV--------------Reminded her at [place] 12/31/2016---- F. Immunizations: Influenza---N/A today Tetanus vaccine: She says that she had a finger laceration --in 2008-- this should have been updated at that time 2008-----WILL BE DUE TO DISCUSS THIS AT NEXT OV--At [place] 12/31/2016 sick with                  ---Influenza--------------will discuss at [facility]--------------------------------- Pneumovax--Willl give it age 39. He does not smoke. She has no indication to need this until age 23. Zostavax --discuss at  age 89------------------------------------------------------------------------------------------------------------------------------WILL BE DUE TO DISCUSS THIS AT NEXT OV-----------------------                -At [place] 12/31/2016 sick with ---Influenza--------------will discuss at [place]---------------------------------  ROV 6 months, sooner if needed.  Marin Olp Dustin, [place], [facility] 12/31/2016 8:18 AM

## 2016-12-31 NOTE — Telephone Encounter (Signed)
Patient called and stated that she was told by [facility] from her  work place that the forms that was left with you at her office visit today  did not need to be filled out. Mayan states Dawn instructed her to have you fill out a prescription pad indicating that Danetta has been  under your care and the days she has been out of work and when she may return.  Lilyanna states she will return on 01/06/2017. When I asked Lashuna if a letter could be typed up on a letter head with the information she stated Dawn requested the information be put on  RX pad and faxed to [phone].

## 2016-12-31 NOTE — Telephone Encounter (Signed)
Letter being printed.

## 2016-12-31 NOTE — Telephone Encounter (Signed)
Letter faxed to number patient provided in previous message. Patient aware

## 2017-01-01 ENCOUNTER — Ambulatory Visit: Payer: Commercial Managed Care - HMO | Admitting: Podiatry

## 2017-01-05 ENCOUNTER — Telehealth: Payer: Self-pay

## 2017-01-05 ENCOUNTER — Ambulatory Visit: Payer: Self-pay | Admitting: Physician Assistant

## 2017-01-05 NOTE — Telephone Encounter (Signed)
Documentation was brought in by Yvonne Wu to fill out for [facility]. Form given to [facility]

## 2017-01-07 ENCOUNTER — Ambulatory Visit (INDEPENDENT_AMBULATORY_CARE_PROVIDER_SITE_OTHER): Payer: Commercial Managed Care - HMO

## 2017-01-07 ENCOUNTER — Ambulatory Visit (INDEPENDENT_AMBULATORY_CARE_PROVIDER_SITE_OTHER): Payer: Commercial Managed Care - HMO | Admitting: Podiatry

## 2017-01-07 DIAGNOSIS — M7750 Other enthesopathy of unspecified foot: Secondary | ICD-10-CM | POA: Diagnosis not present

## 2017-01-07 DIAGNOSIS — M7661 Achilles tendinitis, right leg: Secondary | ICD-10-CM

## 2017-01-07 DIAGNOSIS — M79672 Pain in left foot: Principal | ICD-10-CM

## 2017-01-07 DIAGNOSIS — M7662 Achilles tendinitis, left leg: Secondary | ICD-10-CM | POA: Diagnosis not present

## 2017-01-07 DIAGNOSIS — M79671 Pain in right foot: Secondary | ICD-10-CM

## 2017-01-07 DIAGNOSIS — M779 Enthesopathy, unspecified: Secondary | ICD-10-CM | POA: Diagnosis not present

## 2017-01-07 MED ORDER — TRIAMCINOLONE ACETONIDE 10 MG/ML IJ SUSP
10.0000 mg | Freq: Once | INTRAMUSCULAR | Status: AC
  Administered 2017-01-07: 10 mg

## 2017-01-07 NOTE — Patient Instructions (Signed)

## 2017-01-07 NOTE — Progress Notes (Signed)
Subjective:     Patient ID: Yvonne Wu, female   DOB: 1956-02-15, 61 y.o.   MRN: [id number]  HPI patient presents stating I'm getting a lot of pain on the top of my left foot and both my Achilles tendons bother me if I do a lot of walking   Review of Systems     Objective:   Physical Exam Neurovascular status was intact with inflammation around the midtarsal joint left with fluid buildup around the extensor tendon complex and noted to have moderate pain in the posterior heel region bilateral    Assessment:     Dorsal tendinitis left with possible arthritis and Achilles tendinitis moderate bilateral    Plan:     H&P x-rays reviewed and discussed both conditions. At this point I did a careful sheath injection left 3 mg Kenalog 5 mg Xylocaine advised on heat ice therapy and gave instructions on stretching for the Achilles tendinitis educating her on this. Reappoint to recheck in the next several weeks  X-rays indicate that there is mild spurring but no indication stress fracture or advanced arthritis

## 2017-01-08 ENCOUNTER — Other Ambulatory Visit: Payer: Self-pay | Admitting: Physician Assistant

## 2017-01-08 NOTE — Telephone Encounter (Signed)
Refill appropriate 

## 2017-01-13 NOTE — Telephone Encounter (Signed)
Form has been signed and faxed patient aware and she can pick a copy up at front desk

## 2017-01-20 ENCOUNTER — Encounter: Payer: Self-pay | Admitting: Podiatry

## 2017-01-20 ENCOUNTER — Ambulatory Visit (INDEPENDENT_AMBULATORY_CARE_PROVIDER_SITE_OTHER): Payer: Commercial Managed Care - HMO | Admitting: Podiatry

## 2017-01-20 DIAGNOSIS — M7662 Achilles tendinitis, left leg: Secondary | ICD-10-CM | POA: Diagnosis not present

## 2017-01-20 DIAGNOSIS — M79672 Pain in left foot: Secondary | ICD-10-CM

## 2017-01-20 DIAGNOSIS — M7661 Achilles tendinitis, right leg: Secondary | ICD-10-CM

## 2017-01-20 DIAGNOSIS — M7750 Other enthesopathy of unspecified foot: Secondary | ICD-10-CM | POA: Diagnosis not present

## 2017-01-20 NOTE — Patient Instructions (Signed)

## 2017-01-26 NOTE — Progress Notes (Signed)
Subjective:     Patient ID: Yvonne Wu, female   DOB: 1956-03-13, 61 y.o.   MRN: [id number]  HPI patient presents stating that I am feeling some better but I get discomfort if him on my foot to long   Review of Systems     Objective:   Physical Exam Neurovascular status intact muscle strength adequate with patient found to have inflammatory changes that are improved but still present bilateral    Assessment:     Fasciitis-like symptoms with tendinitis that is improving but still present    Plan:     Reviewed the continuation of anti-inflammatories physical therapy and supportive shoes and if symptoms were to worsen patient will be seen back

## 2017-02-16 DIAGNOSIS — J301 Allergic rhinitis due to pollen: Secondary | ICD-10-CM | POA: Diagnosis not present

## 2017-02-16 DIAGNOSIS — J3089 Other allergic rhinitis: Secondary | ICD-10-CM | POA: Diagnosis not present

## 2017-02-16 DIAGNOSIS — H1045 Other chronic allergic conjunctivitis: Secondary | ICD-10-CM | POA: Diagnosis not present

## 2017-02-20 DIAGNOSIS — J3089 Other allergic rhinitis: Secondary | ICD-10-CM | POA: Diagnosis not present

## 2017-02-20 DIAGNOSIS — J301 Allergic rhinitis due to pollen: Secondary | ICD-10-CM | POA: Diagnosis not present

## 2017-02-23 DIAGNOSIS — Z01419 Encounter for gynecological examination (general) (routine) without abnormal findings: Secondary | ICD-10-CM | POA: Diagnosis not present

## 2017-03-03 DIAGNOSIS — J3089 Other allergic rhinitis: Secondary | ICD-10-CM | POA: Diagnosis not present

## 2017-03-03 DIAGNOSIS — J301 Allergic rhinitis due to pollen: Secondary | ICD-10-CM | POA: Diagnosis not present

## 2017-03-06 DIAGNOSIS — J3089 Other allergic rhinitis: Secondary | ICD-10-CM | POA: Diagnosis not present

## 2017-03-06 DIAGNOSIS — J301 Allergic rhinitis due to pollen: Secondary | ICD-10-CM | POA: Diagnosis not present

## 2017-03-10 ENCOUNTER — Other Ambulatory Visit: Payer: Self-pay | Admitting: Physician Assistant

## 2017-03-10 NOTE — Telephone Encounter (Signed)
Refill appropriate 

## 2017-03-12 DIAGNOSIS — J3089 Other allergic rhinitis: Secondary | ICD-10-CM | POA: Diagnosis not present

## 2017-03-12 DIAGNOSIS — J301 Allergic rhinitis due to pollen: Secondary | ICD-10-CM | POA: Diagnosis not present

## 2017-03-19 DIAGNOSIS — J301 Allergic rhinitis due to pollen: Secondary | ICD-10-CM | POA: Diagnosis not present

## 2017-03-19 DIAGNOSIS — J3089 Other allergic rhinitis: Secondary | ICD-10-CM | POA: Diagnosis not present

## 2017-04-03 DIAGNOSIS — J3089 Other allergic rhinitis: Secondary | ICD-10-CM | POA: Diagnosis not present

## 2017-04-03 DIAGNOSIS — J301 Allergic rhinitis due to pollen: Secondary | ICD-10-CM | POA: Diagnosis not present

## 2017-04-10 DIAGNOSIS — J3089 Other allergic rhinitis: Secondary | ICD-10-CM | POA: Diagnosis not present

## 2017-04-10 DIAGNOSIS — J301 Allergic rhinitis due to pollen: Secondary | ICD-10-CM | POA: Diagnosis not present

## 2017-04-13 ENCOUNTER — Other Ambulatory Visit: Payer: Self-pay | Admitting: Physician Assistant

## 2017-04-13 DIAGNOSIS — M545 Low back pain, unspecified: Secondary | ICD-10-CM

## 2017-04-20 ENCOUNTER — Other Ambulatory Visit: Payer: Self-pay | Admitting: Physician Assistant

## 2017-04-20 NOTE — Telephone Encounter (Signed)
Approved. #[id number]. 

## 2017-04-20 NOTE — Telephone Encounter (Signed)
Rx called in to pharmacy. 

## 2017-04-20 NOTE — Telephone Encounter (Signed)
LAST OV 3/14 Last refill 06/16/2016 Ok to refill?

## 2017-04-24 DIAGNOSIS — J3089 Other allergic rhinitis: Secondary | ICD-10-CM | POA: Diagnosis not present

## 2017-04-24 DIAGNOSIS — J301 Allergic rhinitis due to pollen: Secondary | ICD-10-CM | POA: Diagnosis not present

## 2017-05-07 DIAGNOSIS — J3089 Other allergic rhinitis: Secondary | ICD-10-CM | POA: Diagnosis not present

## 2017-05-07 DIAGNOSIS — J301 Allergic rhinitis due to pollen: Secondary | ICD-10-CM | POA: Diagnosis not present

## 2017-05-07 DIAGNOSIS — M19072 Primary osteoarthritis, left ankle and foot: Secondary | ICD-10-CM | POA: Diagnosis not present

## 2017-05-08 LAB — HM MAMMOGRAPHY: HM Mammogram: NORMAL (ref 0–4)

## 2017-05-20 DIAGNOSIS — L309 Dermatitis, unspecified: Secondary | ICD-10-CM | POA: Diagnosis not present

## 2017-05-20 DIAGNOSIS — D229 Melanocytic nevi, unspecified: Secondary | ICD-10-CM | POA: Diagnosis not present

## 2017-05-20 DIAGNOSIS — L299 Pruritus, unspecified: Secondary | ICD-10-CM | POA: Diagnosis not present

## 2017-05-22 ENCOUNTER — Other Ambulatory Visit: Payer: Self-pay | Admitting: Physician Assistant

## 2017-05-22 DIAGNOSIS — M545 Low back pain, unspecified: Secondary | ICD-10-CM

## 2017-05-22 NOTE — Telephone Encounter (Signed)
Refill appropriate 

## 2017-05-26 DIAGNOSIS — J3089 Other allergic rhinitis: Secondary | ICD-10-CM | POA: Diagnosis not present

## 2017-05-26 DIAGNOSIS — J301 Allergic rhinitis due to pollen: Secondary | ICD-10-CM | POA: Diagnosis not present

## 2017-06-12 DIAGNOSIS — J301 Allergic rhinitis due to pollen: Secondary | ICD-10-CM | POA: Diagnosis not present

## 2017-06-12 DIAGNOSIS — J3089 Other allergic rhinitis: Secondary | ICD-10-CM | POA: Diagnosis not present

## 2017-07-08 ENCOUNTER — Other Ambulatory Visit: Payer: Self-pay | Admitting: Physician Assistant

## 2017-07-08 DIAGNOSIS — M545 Low back pain, unspecified: Secondary | ICD-10-CM

## 2017-07-09 ENCOUNTER — Encounter: Payer: Self-pay | Admitting: Physician Assistant

## 2017-07-09 ENCOUNTER — Ambulatory Visit (INDEPENDENT_AMBULATORY_CARE_PROVIDER_SITE_OTHER): Payer: 59 | Admitting: Physician Assistant

## 2017-07-09 VITALS — BP 124/86 | HR 67 | Temp 97.7°F | Resp 16 | Ht 64.0 in | Wt 166.2 lb

## 2017-07-09 DIAGNOSIS — Z23 Encounter for immunization: Secondary | ICD-10-CM

## 2017-07-09 DIAGNOSIS — I10 Essential (primary) hypertension: Secondary | ICD-10-CM | POA: Diagnosis not present

## 2017-07-09 DIAGNOSIS — Z1159 Encounter for screening for other viral diseases: Secondary | ICD-10-CM | POA: Diagnosis not present

## 2017-07-09 DIAGNOSIS — M8580 Other specified disorders of bone density and structure, unspecified site: Secondary | ICD-10-CM

## 2017-07-09 DIAGNOSIS — F419 Anxiety disorder, unspecified: Secondary | ICD-10-CM | POA: Diagnosis not present

## 2017-07-09 DIAGNOSIS — E039 Hypothyroidism, unspecified: Secondary | ICD-10-CM | POA: Diagnosis not present

## 2017-07-09 DIAGNOSIS — Z114 Encounter for screening for human immunodeficiency virus [HIV]: Secondary | ICD-10-CM | POA: Diagnosis not present

## 2017-07-09 NOTE — Progress Notes (Signed)
Patient ID: BIANEY TESORO MRN: [id number], DOB: 31-Mar-1956, 61 y.o. Date of Encounter: 07/09/2017,   Chief Complaint: Routine followup of blood pressure, thyroid.   HPI: 61 y.o. y/o white female  here for above.  (NOTE: I have been seeing her for years. She was having situational anxiety and depression because her husband had cancer--leukemia. He died of Leukemia 2013/03/08.)  At visit May 2015 she had told me that she had gone off of her Wellbutrin and was feeling fine without the medication in regards to anxiety and depression symptoms.  Also reported that she is rarely needing her clonazepam.  Today she reports that all of this is remained the same. Says that she is feeling okay in regards to mood.   At [place] 12/2014 she was in the process of moving into a new house. Said that after her husband passed away, she needed to down size and get rid of a lot of stuff.  Built a new house near her mom. Her daughter and grandchildren live with patient. Pt states that her daughter has bipolar and that she "probably always will need to live with me " --More recently, this daughter and grandson have become patients of mine so I have gotten to know them as well.   At [place] 10/11/2015: Says she gets this area of itchy irritated skin occasionally. Applies moisturizer and it gets better but says that I had given her some steroid cream in the past and she is needing a refill on that. At [place] 06/2016--says she saw a specialist and is using this cream, which is working well---Desonide Cream 0.05%---Apply to affected area BID x 7 days  At [place] 10/11/2015: She is also requesting an increase in her dose of Celebrex. Says that she is on her feet all day and her feet hurt. Also occasional back pain and shoulder pain.  She is taking her blood pressure medication daily as directed. No lightheadedness or other adverse effects. She is taking her thyroid medication daily as directed. No unexplained changes in weight. No  palpitations. No changes in hair or skin.  10/09/2016: Today she is here just to discuss anxiety and depression. As stated above she had been on Wellbutrin in the past during the time that her husband was ill and passed away. However after that she had discontinued the Wellbutrin and was feeling fine off of that medication. Today she states that she has been feeling symptoms of anxiety and depression recently. Says that her granddaughter had a baby. Her granddaughter is 69 years old but patient says that "she has her own problems with her nerves "also says that she had problems with postpartum depression and now has been found to be anemic and is having bruises all over her body and is going to see Hematologist.   As well she will call patient and ask her to help watch the baby. Says that she ends up watching the baby, then has to go to work. Says "it's just too much for somebody at my age". Also says "I just can't help but worry". Also says that she had to put her dog down last week. She gets teary-eyed talking about this. Says that she had the dog for 13 years. Also says that her husband "loved that dog" and " a lot of memories with my husband with that dog"----all of these memories went through her mind with the dog dying.  12/31/2016: Reports that last week she called here and Dr. Dennard Schaumann felt that  she had influenza and called her in treatment dose of Tamiflu. States that she just completed this yesterday. Says the symptoms started last Thursday night which would have been 12/25/16. Says at that time she had 2 episodes of diarrhea. Then developed severe body aches, chills, and nausea but no vomiting. Has had no further diarrhea. Also had bad headache and her nostrils burned. Felt some drainage in her throat and upper chest region but no deep chest congestion and not no mucus from the nose. Says her nostrils have felt like they were burning so she has started using some nasal saline. Still having some  headache so taking Tylenol. Still feels weak has had no appetite and therefore has eaten very little and even hasn't wanted to drink much of anything. Getting no thick dark mucus from the nose and no thick dark phlegm from the chest. Brought in FMLA paper work for me to complete. Document her out of work starting Thursday 12/25/16 through Friday 01/02/17. Plan to return to work Monday 01/05/17.  Says that she said she has been sick it is been hard to even take pills so she had just stopped the Wellbutrin. Also wasn't sure she still needed it. However after discussing today she says that yes she probably should get back on it even though she has not been feeling as depressed recently and did talk to her granddaughter and told her to stop calling her all upset. Says this has helped, even though she does still worry about her.   Taking BP med as directed. No lightheadedness. Taking thyroid med as directed.    07/09/2017: She goes to [facility] for her GYN care. She had mammogram there are this year. Says that they mentioned as checking a vitamin D level. Otherwise she has no specific concerns to address today. She reports that her anxiety and depression have been stable even off the Wellbutrin. Reports that things at home with her daughter and grandchildren are stable. Taking BP med as directed. No lightheadedness. Taking thyroid med as directed.     Review of Systems: Consitutional: No fever, chills, fatigue, night sweats, lymphadenopathy. No significant/unexplained weight changes. Eyes: No visual changes, eye redness, or discharge. ENT/Mouth: No ear pain, sore throat, nasal drainage, or sinus pain. Cardiovascular: No chest pressure,heaviness, tightness or squeezing, even with exertion. No increased shortness of breath or dyspnea on exertion.No palpitations, edema, orthopnea, PND. Respiratory: No cough, hemoptysis, SOB, or wheezing. Gastrointestinal: No anorexia, dysphagia, reflux, pain, nausea,  vomiting, hematemesis, diarrhea, constipation, BRBPR, or melena. Breast: No mass, nodules, bulging, or retraction. No skin changes or inflammation. No nipple discharge. No lymphadenopathy. Genitourinary: No dysuria, hematuria, incontinence, vaginal discharge, pruritis, burning, abnormal bleeding, or pain. Musculoskeletal: No decreased ROM, No joint pain or swelling. No significant pain in neck. Skin: No rash, pruritis, or concerning lesions. Neurological: No headache, dizziness, syncope, seizures, tremors, memory loss, coordination problems, or paresthesias. Psychological: No anxiety, depression, hallucinations, SI/HI. Endocrine: No polydipsia, polyphagia, polyuria, or known diabetes.No increased fatigue. No palpitations/rapid heart rate. No significant/unexplained weight change. All other systems were reviewed and are otherwise negative.  Past Medical History:  Diagnosis Date  . Anxiety   . Depression    situational  . Hypertension   . Hypothyroid   . Urinary, incontinence, stress female 02/15/2015     Past Surgical History:  Procedure Laterality Date  . BLADDER SUSPENSION N/A 02/15/2015   Procedure: TRANSVAGINAL TAPE (TVT) Mid-Urethral Sling PROCEDURE;  Surgeon: Azucena Fallen, MD;  Location: [facility];  Service:  Gynecology;  Laterality: N/A;  2 hrs.  . BUNIONECTOMY Left   . CYSTOCELE REPAIR N/A 02/15/2015   Procedure: ANTERIOR REPAIR (CYSTOCELE)/Colporrhaphy;  Surgeon: Azucena Fallen, MD;  Location: [facility];  Service: Gynecology;  Laterality: N/A;  . CYSTOSCOPY N/A 02/15/2015   Procedure: CYSTOSCOPY;  Surgeon: Azucena Fallen, MD;  Location: [facility];  Service: Gynecology;  Laterality: N/A;  . TUBAL LIGATION      Home Meds:  Current Outpatient Prescriptions on File Prior to Visit  Medication Sig Dispense Refill  . acetaminophen (TYLENOL) 325 MG tablet Take 650 mg by mouth every 6 (six) hours as needed for mild pain or headache. Reported on 04/14/2016    . Calcium Carbonate-Vitamin D (CALCIUM  + D PO) Take 2 tablets by mouth daily. Reported on 04/14/2016    . celecoxib (CELEBREX) 200 MG capsule TAKE (1) CAPSULE BY MOUTH TWICE DAILY. 60 capsule 1  . clonazePAM (KLONOPIN) 0.5 MG tablet TAKE (1) TABLET BY MOUTH ONCE DAILY AS NEEDED FOR ANXIETY. 30 tablet 2  . fexofenadine (ALLEGRA) 180 MG tablet Take 180 mg by mouth daily. Reported on 04/14/2016    . fluticasone (FLONASE) 50 MCG/ACT nasal spray USE 2 SPRAYS IN EACH NOSTRIL ONCE DAILY. 16 g 5  . MINOCYCLINE HCL PO Take by mouth 2 (two) times daily. Pt unsure of dose, from Derm for skin    . olmesartan (BENICAR) 20 MG tablet TAKE ONE TABLET BY MOUTH ONCE DAILY. 30 tablet 3  . SYNTHROID 88 MCG tablet TAKE ONE TABLET BY MOUTH ONCE DAILY. 90 tablet 2   No current facility-administered medications on file prior to visit.     Allergies: No Known Allergies  Social History   Social History  . Marital status: Widowed    Spouse name: N/A  . Number of children: N/A  . Years of education: N/A   Occupational History  . Not on file.   Social History Main Topics  . Smoking status: Former Smoker    Quit date: 02/22/1995  . Smokeless tobacco: Never Used  . Alcohol use No  . Drug use: No  . Sexual activity: Not Currently   Other Topics Concern  . Not on file   Social History Narrative   Works at [employer].   Involves a lot of standing and bending.   Husband passed away in 03-16-2013 with leukemia.   Patient states that her daughter is age 22 (as of 76) and has bipolar. This daughter lives with her.   This daughter has a child who is 44 years old and another child he was 61 years old. They all live with the patient.   Patient also has a son who lives in [place].          Family History  Problem Relation Age of Onset  . Asthma Mother   . Mental illness Mother   . Cancer Father 63       lung cancer    Physical Exam: Blood pressure 124/86, pulse 67, temperature 97.7 F (36.5 C), temperature source Oral, resp. rate 16, height 5'  4" (1.626 m), weight 75.4 kg (166 lb 3.2 oz), SpO2 98 %., Body mass index is 28.53 kg/m. General: Well developed, well nourished,WF. Appears  in no acute distress. Neck: Supple. Trachea midline. No thyromegaly. Full ROM. No lymphadenopathy.No Carotid Bruits. Lungs: Clear to auscultation bilaterally without wheezes, rales, or rhonchi. Breathing is of normal effort and unlabored. Cardiovascular: RRR with S1 S2. No murmurs, rubs, or gallops. Distal pulses 2+ symmetrically.  No carotid or abdominal bruits. Abdomen: Soft, non-tender, non-distended with normoactive bowel sounds. No hepatosplenomegaly or masses. No rebound/guarding. No CVA tenderness. No hernias.  Musculoskeletal: Full range of motion and 5/5 strength throughout.  Skin: Warm and moist without erythema, ecchymosis, wounds, or rash. Neuro: A+Ox3. CN II-XII grossly intact. Moves all extremities spontaneously. Full sensation throughout. Normal gait.  Psych:  Responds to questions appropriately with a normal affect.   Assessment/Plan:    1. Anxiety 07/09/2017: Stable/Controlled. Off Wellbutrin.   2. Moderate episode of recurrent major depressive disorder ([place]) 07/09/2017: Stable/Controlled. Off medication/ off Wellbutrin.   Hypertension 07/09/2017: Blood Pressure is at goal. Continue current medication. Check labs monitor.  - COMPLETE METABOLIC PANEL WITH GFR  Hypothyroid 07/09/2017: She is taking thyroid medication daily. Check lab to monitor.  - TSH  Vitamin D deficiency 12/31/2016: - She is taking vitamin D supplements. 07/09/2017--- she reports that the vitamin D supplement calcium with vitamin D and it causes some constipation. Recheck vitamin D level now. Recommended that she increase water fruits and vegetable fiber intake to help with constipation.  Favorable lipid status.  She had a complete physical exam and fasting labs 07/2013. Lipids were good with LDL 128 and all other parameters normal.    Low back pain, Pain in  Feet, Pain in Shoulder 12/31/2016: Reviewed---At prior OV--- Increased Celebrex to twice a day dosing.   Preventive Care:   B. Pap- Per Gyn C. Mammogram: Per Gyn D. DEXA: Per Gyn                                      E. screening colonoscopy: This was done December 2008. Normal. Repeat 10 years.--------------------------------WILL BE DUE TO DISCUSS THIS AT NEXT OV--------------Reminded her at [place] 07/09/2017---- F. Immunizations: Influenza----Given here 07/09/2017 Tetanus vaccine: --She received T dap 2012 Pneumovax--Willl give it age 51. She does not smoke. She has no indication to need this until age 6. Shingrix--Discussed at [place] 07/09/2017--- she is to check cough/coverage with her insurance and then let us know.  ROV 6 months, sooner if needed.  Signed, [address] [place], [place], [facility] 07/09/2017 9:18 AM

## 2017-07-13 LAB — HEPATITIS C ANTIBODY
HEP C AB: REACTIVE — AB
SIGNAL TO CUT-OFF: 2.26 — AB (ref <1.00)

## 2017-07-13 LAB — HCV RNA,QUANTITATIVE REAL TIME PCR
HCV Quantitative Log: <1.18 Log IU/mL
HCV RNA, PCR, QN: <15 IU/mL

## 2017-07-13 LAB — BASIC METABOLIC PANEL WITH GFR
BUN: 11 mg/dL (ref 7–25)
CO2: 25 mmol/L (ref 20–32)
Calcium: 9.3 mg/dL (ref 8.6–10.4)
Chloride: 108 mmol/L (ref 98–110)
Creat: 0.76 mg/dL (ref 0.50–0.99)
GFR, EST AFRICAN AMERICAN: 99 mL/min/{1.73_m2} (ref >=60)
GFR, EST NON AFRICAN AMERICAN: 85 mL/min/{1.73_m2} (ref >=60)
Glucose, Bld: 89 mg/dL (ref 65–99)
POTASSIUM: 4.4 mmol/L (ref 3.5–5.3)
SODIUM: 142 mmol/L (ref 135–146)

## 2017-07-13 LAB — VITAMIN D 25 HYDROXY (VIT D DEFICIENCY, FRACTURES): VIT D 25 HYDROXY: 36 ng/mL (ref 30–100)

## 2017-07-13 LAB — TSH: TSH: 0.35 mIU/L — ABNORMAL LOW (ref 0.40–4.50)

## 2017-07-13 LAB — HIV ANTIBODY (ROUTINE TESTING W REFLEX): HIV 1&2 Ab, 4th Generation: NONREACTIVE

## 2017-07-14 DIAGNOSIS — B1920 Unspecified viral hepatitis C without hepatic coma: Secondary | ICD-10-CM | POA: Insufficient documentation

## 2017-07-15 ENCOUNTER — Telehealth: Payer: Self-pay

## 2017-07-15 ENCOUNTER — Other Ambulatory Visit: Payer: Self-pay | Admitting: Family Medicine

## 2017-07-15 DIAGNOSIS — M79672 Pain in left foot: Secondary | ICD-10-CM | POA: Diagnosis not present

## 2017-07-15 DIAGNOSIS — R768 Other specified abnormal immunological findings in serum: Secondary | ICD-10-CM

## 2017-07-15 DIAGNOSIS — J3089 Other allergic rhinitis: Secondary | ICD-10-CM | POA: Diagnosis not present

## 2017-07-15 DIAGNOSIS — J301 Allergic rhinitis due to pollen: Secondary | ICD-10-CM | POA: Diagnosis not present

## 2017-07-15 DIAGNOSIS — M19072 Primary osteoarthritis, left ankle and foot: Secondary | ICD-10-CM | POA: Diagnosis not present

## 2017-07-15 DIAGNOSIS — G8929 Other chronic pain: Secondary | ICD-10-CM | POA: Diagnosis not present

## 2017-07-15 NOTE — Telephone Encounter (Signed)
Tried calling patient about more lab testing voicemail was full

## 2017-07-16 ENCOUNTER — Other Ambulatory Visit: Payer: Self-pay | Admitting: Family Medicine

## 2017-07-16 ENCOUNTER — Other Ambulatory Visit: Payer: 59

## 2017-07-16 DIAGNOSIS — R768 Other specified abnormal immunological findings in serum: Secondary | ICD-10-CM

## 2017-07-16 NOTE — Telephone Encounter (Signed)
Pt made aware of referral to [facility] and need to come and complete referral related blood work.  Future orders for all needed placed

## 2017-07-16 NOTE — Telephone Encounter (Signed)
Thanks for ordering all the labs and taking care of this !!

## 2017-07-20 DIAGNOSIS — J301 Allergic rhinitis due to pollen: Secondary | ICD-10-CM | POA: Diagnosis not present

## 2017-07-20 LAB — COMPLETE METABOLIC PANEL WITH GFR
AG RATIO: 1.8 (calc) (ref 1.0–2.5)
ALKALINE PHOSPHATASE (APISO): 99 U/L (ref 33–130)
ALT: 11 U/L (ref 6–29)
AST: 14 U/L (ref 10–35)
Albumin: 4.3 g/dL (ref 3.6–5.1)
BILIRUBIN TOTAL: 0.5 mg/dL (ref 0.2–1.2)
BUN: 13 mg/dL (ref 7–25)
CHLORIDE: 106 mmol/L (ref 98–110)
CO2: 22 mmol/L (ref 20–32)
Calcium: 9.3 mg/dL (ref 8.6–10.4)
Creat: 0.71 mg/dL (ref 0.50–0.99)
GFR, EST AFRICAN AMERICAN: 107 mL/min/{1.73_m2} (ref >=60)
GFR, Est Non African American: 93 mL/min/{1.73_m2} (ref >=60)
GLUCOSE: 95 mg/dL (ref 65–99)
Globulin: 2.4 g/dL (calc) (ref 1.9–3.7)
POTASSIUM: 4.5 mmol/L (ref 3.5–5.3)
Sodium: 141 mmol/L (ref 135–146)
TOTAL PROTEIN: 6.7 g/dL (ref 6.1–8.1)

## 2017-07-20 LAB — CBC WITH DIFFERENTIAL/PLATELET
Basophils Absolute: 31 cells/uL (ref 0–200)
Basophils Relative: 0.7 %
EOS PCT: 4.5 %
Eosinophils Absolute: 198 cells/uL (ref 15–500)
HEMATOCRIT: 38.3 % (ref 35.0–45.0)
HEMOGLOBIN: 12.9 g/dL (ref 11.7–15.5)
LYMPHS ABS: 1544 {cells}/uL (ref 850–3900)
MCH: 29.4 pg (ref 27.0–33.0)
MCHC: 33.7 g/dL (ref 32.0–36.0)
MCV: 87.2 fL (ref 80.0–100.0)
MPV: 9.9 fL (ref 7.5–12.5)
Monocytes Relative: 9.3 %
NEUTROS ABS: 2218 {cells}/uL (ref 1500–7800)
Neutrophils Relative %: 50.4 %
Platelets: 298 10*3/uL (ref 140–400)
RBC: 4.39 10*6/uL (ref 3.80–5.10)
RDW: 13.2 % (ref 11.0–15.0)
Total Lymphocyte: 35.1 %
WBC: 4.4 10*3/uL (ref 3.8–10.8)
WBCMIX: 409 {cells}/uL (ref 200–950)

## 2017-07-20 LAB — ANTI-NUCLEAR AB-TITER (ANA TITER): ANA Titer 1: 1:160 {titer} — ABNORMAL HIGH

## 2017-07-20 LAB — HEPATITIS A ANTIBODY, TOTAL: Hepatitis A AB,Total: REACTIVE — AB

## 2017-07-20 LAB — IRON, TOTAL/TOTAL IRON BINDING CAP
%SAT: 28 % (ref 11–50)
IRON: 87 ug/dL (ref 45–160)
TIBC: 314 ug/dL (ref 250–450)

## 2017-07-20 LAB — HEPATITIS B SURFACE ANTIBODY, QUANTITATIVE: Hepatitis B-Post: <5 m[IU]/mL — ABNORMAL LOW (ref >=10)

## 2017-07-20 LAB — HEPATITIS C GENOTYPE: HCV GENOTYPE: NOT DETECTED

## 2017-07-20 LAB — PROTIME-INR
INR: 1
Prothrombin Time: 10.1 s (ref 9.0–11.5)

## 2017-07-20 LAB — HEPATITIS B SURFACE ANTIGEN: Hepatitis B Surface Ag: NONREACTIVE

## 2017-07-20 LAB — HEPATITIS B CORE ANTIBODY, TOTAL: Hep B Core Total Ab: NONREACTIVE

## 2017-07-20 LAB — ANA: ANA: POSITIVE — AB

## 2017-07-21 DIAGNOSIS — J3089 Other allergic rhinitis: Secondary | ICD-10-CM | POA: Diagnosis not present

## 2017-07-22 ENCOUNTER — Telehealth: Payer: Self-pay | Admitting: Family Medicine

## 2017-07-22 NOTE — Telephone Encounter (Signed)
Pt was notified yesterday and entry was made on the referral itself.  Here is note for easier access.

## 2017-07-22 NOTE — Telephone Encounter (Signed)
-----   Message from Orlena Sheldon, PA-C sent at 07/22/2017  7:19 AM EDT ----- Ok.  Can you enter this as a phone message so it will be documented in the chart  And call and inform patient Thanks.   ----- Message ----- From: Olena Mater, LPN Sent: [id number]/03/7340   2:24 PM To: Orlena Sheldon, PA-C  Infectious Disease office called.  They have reviewed Ms. Sendejo's lab results.  They say she has been "cured" and does not need to be seen.  They canceled the referral.

## 2017-07-24 DIAGNOSIS — M19072 Primary osteoarthritis, left ankle and foot: Secondary | ICD-10-CM | POA: Diagnosis not present

## 2017-07-24 DIAGNOSIS — J301 Allergic rhinitis due to pollen: Secondary | ICD-10-CM | POA: Diagnosis not present

## 2017-07-24 DIAGNOSIS — J3089 Other allergic rhinitis: Secondary | ICD-10-CM | POA: Diagnosis not present

## 2017-08-07 DIAGNOSIS — J301 Allergic rhinitis due to pollen: Secondary | ICD-10-CM | POA: Diagnosis not present

## 2017-08-07 DIAGNOSIS — J3089 Other allergic rhinitis: Secondary | ICD-10-CM | POA: Diagnosis not present

## 2017-08-14 DIAGNOSIS — J301 Allergic rhinitis due to pollen: Secondary | ICD-10-CM | POA: Diagnosis not present

## 2017-08-14 DIAGNOSIS — J3089 Other allergic rhinitis: Secondary | ICD-10-CM | POA: Diagnosis not present

## 2017-08-15 ENCOUNTER — Other Ambulatory Visit: Payer: Self-pay | Admitting: Physician Assistant

## 2017-08-15 DIAGNOSIS — M545 Low back pain, unspecified: Secondary | ICD-10-CM

## 2017-08-18 DIAGNOSIS — J3089 Other allergic rhinitis: Secondary | ICD-10-CM | POA: Diagnosis not present

## 2017-08-18 DIAGNOSIS — J301 Allergic rhinitis due to pollen: Secondary | ICD-10-CM | POA: Diagnosis not present

## 2017-08-28 DIAGNOSIS — J3089 Other allergic rhinitis: Secondary | ICD-10-CM | POA: Diagnosis not present

## 2017-08-28 DIAGNOSIS — J301 Allergic rhinitis due to pollen: Secondary | ICD-10-CM | POA: Diagnosis not present

## 2017-09-01 DIAGNOSIS — J3089 Other allergic rhinitis: Secondary | ICD-10-CM | POA: Diagnosis not present

## 2017-09-01 DIAGNOSIS — J301 Allergic rhinitis due to pollen: Secondary | ICD-10-CM | POA: Diagnosis not present

## 2017-09-17 DIAGNOSIS — J3089 Other allergic rhinitis: Secondary | ICD-10-CM | POA: Diagnosis not present

## 2017-09-17 DIAGNOSIS — J301 Allergic rhinitis due to pollen: Secondary | ICD-10-CM | POA: Diagnosis not present

## 2017-09-23 DIAGNOSIS — J301 Allergic rhinitis due to pollen: Secondary | ICD-10-CM | POA: Diagnosis not present

## 2017-09-23 DIAGNOSIS — J3089 Other allergic rhinitis: Secondary | ICD-10-CM | POA: Diagnosis not present

## 2017-10-01 DIAGNOSIS — J3089 Other allergic rhinitis: Secondary | ICD-10-CM | POA: Diagnosis not present

## 2017-10-01 DIAGNOSIS — J301 Allergic rhinitis due to pollen: Secondary | ICD-10-CM | POA: Diagnosis not present

## 2017-10-16 DIAGNOSIS — J3089 Other allergic rhinitis: Secondary | ICD-10-CM | POA: Diagnosis not present

## 2017-10-16 DIAGNOSIS — J301 Allergic rhinitis due to pollen: Secondary | ICD-10-CM | POA: Diagnosis not present

## 2017-10-19 ENCOUNTER — Other Ambulatory Visit: Payer: Self-pay | Admitting: Physician Assistant

## 2017-10-22 DIAGNOSIS — J3089 Other allergic rhinitis: Secondary | ICD-10-CM | POA: Diagnosis not present

## 2017-10-22 DIAGNOSIS — J301 Allergic rhinitis due to pollen: Secondary | ICD-10-CM | POA: Diagnosis not present

## 2017-10-28 DIAGNOSIS — D229 Melanocytic nevi, unspecified: Secondary | ICD-10-CM | POA: Diagnosis not present

## 2017-10-28 DIAGNOSIS — L309 Dermatitis, unspecified: Secondary | ICD-10-CM | POA: Diagnosis not present

## 2017-10-30 DIAGNOSIS — J3089 Other allergic rhinitis: Secondary | ICD-10-CM | POA: Diagnosis not present

## 2017-10-30 DIAGNOSIS — J301 Allergic rhinitis due to pollen: Secondary | ICD-10-CM | POA: Diagnosis not present

## 2017-11-04 DIAGNOSIS — J301 Allergic rhinitis due to pollen: Secondary | ICD-10-CM | POA: Diagnosis not present

## 2017-11-04 DIAGNOSIS — J3089 Other allergic rhinitis: Secondary | ICD-10-CM | POA: Diagnosis not present

## 2017-11-09 DIAGNOSIS — J3089 Other allergic rhinitis: Secondary | ICD-10-CM | POA: Diagnosis not present

## 2017-11-09 DIAGNOSIS — J301 Allergic rhinitis due to pollen: Secondary | ICD-10-CM | POA: Diagnosis not present

## 2017-11-10 DIAGNOSIS — H35463 Secondary vitreoretinal degeneration, bilateral: Secondary | ICD-10-CM | POA: Diagnosis not present

## 2017-11-18 DIAGNOSIS — J3089 Other allergic rhinitis: Secondary | ICD-10-CM | POA: Diagnosis not present

## 2017-11-18 DIAGNOSIS — J301 Allergic rhinitis due to pollen: Secondary | ICD-10-CM | POA: Diagnosis not present

## 2017-11-21 ENCOUNTER — Other Ambulatory Visit: Payer: Self-pay | Admitting: Physician Assistant

## 2017-11-23 NOTE — Telephone Encounter (Signed)
rx filled patient aware follow up appointment has been scheduled

## 2017-11-23 NOTE — Telephone Encounter (Signed)
She is on blood pressure medication and thyroid medication, which require office visit and lab work to monitor every 6 months. These were last checked 07/09/2017---due again 01/06/2018. Send refill on each of these medications for a #30+1 to hold her until follow-up visit.  Make Sure she has a visit scheduled for around March 20.

## 2017-11-23 NOTE — Telephone Encounter (Signed)
Last OV 12/31/2016 Last refill 04/20/2017 Okay to refill?

## 2017-12-02 DIAGNOSIS — J301 Allergic rhinitis due to pollen: Secondary | ICD-10-CM | POA: Diagnosis not present

## 2017-12-02 DIAGNOSIS — J3089 Other allergic rhinitis: Secondary | ICD-10-CM | POA: Diagnosis not present

## 2017-12-11 DIAGNOSIS — J301 Allergic rhinitis due to pollen: Secondary | ICD-10-CM | POA: Diagnosis not present

## 2017-12-11 DIAGNOSIS — J3089 Other allergic rhinitis: Secondary | ICD-10-CM | POA: Diagnosis not present

## 2017-12-23 DIAGNOSIS — J301 Allergic rhinitis due to pollen: Secondary | ICD-10-CM | POA: Diagnosis not present

## 2017-12-23 DIAGNOSIS — J3089 Other allergic rhinitis: Secondary | ICD-10-CM | POA: Diagnosis not present

## 2017-12-25 ENCOUNTER — Other Ambulatory Visit: Payer: Self-pay | Admitting: Physician Assistant

## 2017-12-28 DIAGNOSIS — J3089 Other allergic rhinitis: Secondary | ICD-10-CM | POA: Diagnosis not present

## 2017-12-28 DIAGNOSIS — J301 Allergic rhinitis due to pollen: Secondary | ICD-10-CM | POA: Diagnosis not present

## 2018-01-06 ENCOUNTER — Ambulatory Visit: Payer: 59 | Admitting: Physician Assistant

## 2018-01-07 ENCOUNTER — Other Ambulatory Visit: Payer: Self-pay

## 2018-01-07 ENCOUNTER — Ambulatory Visit: Payer: 59 | Admitting: Physician Assistant

## 2018-01-07 ENCOUNTER — Encounter: Payer: Self-pay | Admitting: Physician Assistant

## 2018-01-07 VITALS — BP 124/80 | HR 56 | Temp 97.6°F | Resp 14 | Ht 63.5 in | Wt 161.2 lb

## 2018-01-07 DIAGNOSIS — E039 Hypothyroidism, unspecified: Secondary | ICD-10-CM

## 2018-01-07 DIAGNOSIS — N393 Stress incontinence (female) (male): Secondary | ICD-10-CM | POA: Diagnosis not present

## 2018-01-07 DIAGNOSIS — F419 Anxiety disorder, unspecified: Secondary | ICD-10-CM | POA: Diagnosis not present

## 2018-01-07 DIAGNOSIS — M8580 Other specified disorders of bone density and structure, unspecified site: Secondary | ICD-10-CM | POA: Diagnosis not present

## 2018-01-07 DIAGNOSIS — I10 Essential (primary) hypertension: Secondary | ICD-10-CM | POA: Diagnosis not present

## 2018-01-07 NOTE — Progress Notes (Signed)
Patient ID: Yvonne Wu MRN: [id number], DOB: [birth year], 62 y.o. Date of Encounter: 01/07/2018,   Chief Complaint: Routine followup of blood pressure, thyroid.   HPI: 62 y.o. y/o white female  here for above.  (NOTE: I have been seeing her for years. She was having situational anxiety and depression because her husband had cancer--leukemia. He died of Leukemia 02-21-2013.)  At visit May 2015 she had told me that she had gone off of her Wellbutrin and was feeling fine without the medication in regards to anxiety and depression symptoms.  Also reported that she is rarely needing her clonazepam.  Today she reports that all of this is remained the same. Says that she is feeling okay in regards to mood.   At [place] 12/2014 she was in the process of moving into a new house. Said that after her husband passed away, she needed to down size and get rid of a lot of stuff.  Built a new house near her mom. Her daughter and grandchildren live with patient. Pt states that her daughter has bipolar and that she "probably always will need to live with me " --More recently, this daughter and grandson have become patients of mine so I have gotten to know them as well.   At [place] 10/11/2015: Says she gets this area of itchy irritated skin occasionally. Applies moisturizer and it gets better but says that I had given her some steroid cream in the past and she is needing a refill on that. At [place] 06/2016--says she saw a specialist and is using this cream, which is working well---Desonide Cream 0.05%---Apply to affected area BID x 7 days  At [place] 10/11/2015: She is also requesting an increase in her dose of Celebrex. Says that she is on her feet all day and her feet hurt. Also occasional back pain and shoulder pain.  She is taking her blood pressure medication daily as directed. No lightheadedness or other adverse effects. She is taking her thyroid medication daily as directed. No unexplained changes in weight. No  palpitations. No changes in hair or skin.   10/09/2016: Today she is here just to discuss anxiety and depression. As stated above she had been on Wellbutrin in the past during the time that her husband was ill and passed away. However after that she had discontinued the Wellbutrin and was feeling fine off of that medication. Today she states that she has been feeling symptoms of anxiety and depression recently. Says that her granddaughter had a baby. Her granddaughter is 45 years old but patient says that "she has her own problems with her nerves "also says that she had problems with postpartum depression and now has been found to be anemic and is having bruises all over her body and is going to see Hematologist.   As well she will call patient and ask her to help watch the baby. Says that she ends up watching the baby, then has to go to work. Says "it's just too much for somebody at my age". Also says "I just can't help but worry". Also says that she had to put her dog down last week. She gets teary-eyed talking about this. Says that she had the dog for 13 years. Also says that her husband "loved that dog" and " a lot of memories with my husband with that dog"----all of these memories went through her mind with the dog dying.   12/31/2016: Reports that last week she called here and Dr. Dennard Schaumann  felt that she had influenza and called her in treatment dose of Tamiflu. States that she just completed this yesterday. Says the symptoms started last Thursday night which would have been 12/25/16. Says at that time she had 2 episodes of diarrhea. Then developed severe body aches, chills, and nausea but no vomiting. Has had no further diarrhea. Also had bad headache and her nostrils burned. Felt some drainage in her throat and upper chest region but no deep chest congestion and not no mucus from the nose. Says her nostrils have felt like they were burning so she has started using some nasal saline. Still having some  headache so taking Tylenol. Still feels weak has had no appetite and therefore has eaten very little and even hasn't wanted to drink much of anything. Getting no thick dark mucus from the nose and no thick dark phlegm from the chest. Brought in FMLA paper work for me to complete. Document her out of work starting Thursday 12/25/16 through Friday 01/02/17. Plan to return to work Monday 01/05/17.  Says that she said she has been sick it is been hard to even take pills so she had just stopped the Wellbutrin. Also wasn't sure she still needed it. However after discussing today she says that yes she probably should get back on it even though she has not been feeling as depressed recently and did talk to her granddaughter and told her to stop calling her all upset. Says this has helped, even though she does still worry about her.   Taking BP med as directed. No lightheadedness. Taking thyroid med as directed.      07/09/2017: She goes to [facility] for her GYN care. She had mammogram there are this year. Says that they mentioned [place] checking a vitamin D level. Otherwise she has no specific concerns to address today. She reports that her anxiety and depression have been stable even off the Wellbutrin. Reports that things at home with her daughter and grandchildren are stable. Taking BP med as directed. No lightheadedness. Taking thyroid med as directed.    01/07/2018: Reports that she has retired from her job.  Says that she thinks that things are actually just as stressful for her now as they had been.  At home it is her daughter who has bipolar disorder, her granddaughter who is 80 years old and also has bipolar disorder and her baby who is 63 months old.  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  However says that overall she does feel stable and feels like she is handling this as well as possible.  Feels  that her own anxiety and depression are stable/controlled. Continues to take blood pressure medication as directed.  This is causing no lightheadedness or other adverse effects. She continues to take her thyroid medication daily. She has no specific concerns to address today.   Review of Systems: Consitutional: No fever, chills, fatigue, night sweats, lymphadenopathy. No significant/unexplained weight changes. Eyes: No visual changes, eye redness, or discharge. ENT/Mouth: No ear pain, sore throat, nasal drainage, or sinus pain. Cardiovascular: No chest pressure,heaviness, tightness or squeezing, even with exertion. No increased shortness of breath or dyspnea on exertion.No palpitations, edema, orthopnea, PND. Respiratory: No cough, hemoptysis, SOB, or wheezing. Gastrointestinal: No anorexia, dysphagia, reflux, pain, nausea, vomiting, hematemesis, diarrhea, constipation, BRBPR, or melena. Breast: No mass, nodules, bulging, or retraction. No skin changes or inflammation. No nipple discharge. No  lymphadenopathy. Genitourinary: No dysuria, hematuria, incontinence, vaginal discharge, pruritis, burning, abnormal bleeding, or pain. Musculoskeletal: No decreased ROM, No joint pain or swelling. No significant pain in neck. Skin: No rash, pruritis, or concerning lesions. Neurological: No headache, dizziness, syncope, seizures, tremors, memory loss, coordination problems, or paresthesias. Psychological: No anxiety, depression, hallucinations, SI/HI. Endocrine: No polydipsia, polyphagia, polyuria, or known diabetes.No increased fatigue. No palpitations/rapid heart rate. No significant/unexplained weight change. All other systems were reviewed and are otherwise negative.  Past Medical History:  Diagnosis Date  . Anxiety   . Depression    situational  . Hypertension   . Hypothyroid   . Urinary, incontinence, stress female 02/15/2015     Past Surgical History:  Procedure Laterality Date  . BLADDER  SUSPENSION N/A 02/15/2015   Procedure: TRANSVAGINAL TAPE (TVT) Mid-Urethral Sling PROCEDURE;  Surgeon: Azucena Fallen, MD;  Location: [facility];  Service: Gynecology;  Laterality: N/A;  2 hrs.  . BUNIONECTOMY Left   . CYSTOCELE REPAIR N/A 02/15/2015   Procedure: ANTERIOR REPAIR (CYSTOCELE)/Colporrhaphy;  Surgeon: Azucena Fallen, MD;  Location: [facility];  Service: Gynecology;  Laterality: N/A;  . CYSTOSCOPY N/A 02/15/2015   Procedure: CYSTOSCOPY;  Surgeon: Azucena Fallen, MD;  Location: [facility];  Service: Gynecology;  Laterality: N/A;  . TUBAL LIGATION      Home Meds:  Current Outpatient Medications on File Prior to Visit  Medication Sig Dispense Refill  . acetaminophen (TYLENOL) 325 MG tablet Take 650 mg by mouth every 6 (six) hours as needed for mild pain or headache. Reported on 04/14/2016    . celecoxib (CELEBREX) 200 MG capsule TAKE (1) CAPSULE BY MOUTH TWICE DAILY. 60 capsule 2  . clonazePAM (KLONOPIN) 0.5 MG tablet TAKE (1) TABLET BY MOUTH ONCE DAILY AS NEEDED FOR ANXIETY. 30 tablet 1  . fexofenadine (ALLEGRA) 180 MG tablet Take 180 mg by mouth daily. Reported on 04/14/2016    . fluticasone (FLONASE) 50 MCG/ACT nasal spray USE 2 SPRAYS IN EACH NOSTRIL ONCE DAILY. 16 g 5  . MINOCYCLINE HCL PO Take by mouth 2 (two) times daily. Pt unsure of dose, from Derm for skin    . olmesartan (BENICAR) 20 MG tablet TAKE ONE TABLET BY MOUTH ONCE DAILY. 30 tablet 0  . SYNTHROID 88 MCG tablet TAKE ONE TABLET BY MOUTH ONCE DAILY. 30 tablet 3   No current facility-administered medications on file prior to visit.     Allergies: No Known Allergies  Social History   Socioeconomic History  . Marital status: Widowed    Spouse name: Not on file  . Number of children: Not on file  . Years of education: Not on file  . Highest education level: Not on file  Occupational History  . Not on file  Social Needs  . Financial resource strain: Not on file  . Food insecurity:    Worry: Not on file    Inability: Not on  file  . Transportation needs:    Medical: Not on file    Non-medical: Not on file  Tobacco Use  . Smoking status: Former Smoker    Last attempt to quit: 02/22/1995    Years since quitting: 22.8  . Smokeless tobacco: Never Used  Substance and Sexual Activity  . Alcohol use: No  . Drug use: No  . Sexual activity: Not Currently  Lifestyle  . Physical activity:    Days per week: Not on file    Minutes per session: Not on file  . Stress: Not on file  Relationships  .  Social connections:    Talks on phone: Not on file    Gets together: Not on file    Attends religious service: Not on file    Active member of club or organization: Not on file    Attends meetings of clubs or organizations: Not on file    Relationship status: Not on file  . Intimate partner violence:    Fear of current or ex partner: Not on file    Emotionally abused: Not on file    Physically abused: Not on file    Forced sexual activity: Not on file  Other Topics Concern  . Not on file  Social History Narrative   Works at [employer].   Involves a lot of standing and bending.   Husband passed away in 2013/03/13 with leukemia.   Patient states that her daughter is age 41 (as of 54) and has bipolar. This daughter lives with her.   This daughter has a child who is 52 years old and another child he was 76 years old. They all live with the patient.   Patient also has a son who lives in [place].       Family History  Problem Relation Age of Onset  . Asthma Mother   . Mental illness Mother   . Cancer Father 73       lung cancer    Physical Exam: Blood pressure 124/80, pulse (!) 56, temperature 97.6 F (36.4 C), temperature source Oral, resp. rate 14, height 5' 3.5" (1.613 m), weight 73.1 kg (161 lb 3.2 oz), SpO2 99 %., Body mass index is 28.11 kg/m. General: Well developed, well nourished,WF. Appears  in no acute distress. Neck: Supple. Trachea midline. No thyromegaly. Full ROM. No lymphadenopathy.No Carotid  Bruits. Lungs: Clear to auscultation bilaterally without wheezes, rales, or rhonchi. Breathing is of normal effort and unlabored. Cardiovascular: RRR with S1 S2. No murmurs, rubs, or gallops. Distal pulses 2+ symmetrically. No carotid or abdominal bruits. Abdomen: Soft, non-tender, non-distended with normoactive bowel sounds. No hepatosplenomegaly or masses. No rebound/guarding. No CVA tenderness. No hernias.  Musculoskeletal: Full range of motion and 5/5 strength throughout.  Skin: Warm and moist without erythema, ecchymosis, wounds, or rash. Neuro: A+Ox3. CN II-XII grossly intact. Moves all extremities spontaneously. Full sensation throughout. Normal gait.  Psych:  Responds to questions appropriately with a normal affect.  Her mood and affect are very appropriate through visit.   Assessment/Plan:    1. Anxiety 01/07/2018: Stable/Controlled. Off Wellbutrin.   2. Moderate episode of recurrent major depressive disorder ([place]) 01/07/2018:  Stable/Controlled. Off medication/ off Wellbutrin.   Hypertension 01/07/2018:  Blood Pressure is at goal. Continue current medication. Check labs monitor.  - COMPLETE METABOLIC PANEL WITH GFR  Hypothyroid 01/07/2018:  She is taking thyroid medication daily. Check lab to monitor.  - TSH  Vitamin D deficiency 12/31/2016: - She is taking vitamin D supplements. 07/09/2017--- she reports that the vitamin D supplement calcium with vitamin D and it causes some constipation. Recheck vitamin D level now. Recommended that she increase water fruits and vegetable fiber intake to help with constipation.  Favorable lipid status.  She had a complete physical exam and fasting labs 07/2013. Lipids were good with LDL 128 and all other parameters normal.    Low back pain, Pain in Feet, Pain in Shoulder 12/31/2016: Reviewed---At prior OV--- Increased Celebrex to twice a day dosing.   Preventive Care:   B. Pap- Per Gyn C. Mammogram: Per Gyn D. DEXA: Per [facility] [place]  E. screening colonoscopy: This was done December 2008. Normal. Repeat 10 years.--------------------------------WILL BE DUE TO DISCUSS THIS AT NEXT OV--------------Reminded her at [place] 07/09/2017---- F. Immunizations: Influenza----Given here 07/09/2017 Tetanus vaccine: --She received T dap 2012 Pneumovax--Willl give it age 65. She does not smoke. She has no indication to need this until age 52. Shingrix--Discussed at [facility] 07/09/2017--- she is to check cough/coverage with her insurance and then let us know.  ROV 6 months, sooner if needed.  Signed, [address] [place], [place], [facility] 01/07/2018 11:50 AM

## 2018-01-08 LAB — BASIC METABOLIC PANEL WITH GFR
BUN: 12 mg/dL (ref 7–25)
CO2: 27 mmol/L (ref 20–32)
CREATININE: 0.69 mg/dL (ref 0.50–0.99)
Calcium: 9.7 mg/dL (ref 8.6–10.4)
Chloride: 105 mmol/L (ref 98–110)
GFR, Est African American: 109 mL/min/{1.73_m2} (ref >=60)
GFR, Est Non African American: 94 mL/min/{1.73_m2} (ref >=60)
GLUCOSE: 90 mg/dL (ref 65–99)
Potassium: 4.3 mmol/L (ref 3.5–5.3)
Sodium: 141 mmol/L (ref 135–146)

## 2018-01-08 LAB — TSH: TSH: 1.69 m[IU]/L (ref 0.40–4.50)

## 2018-01-12 DIAGNOSIS — J301 Allergic rhinitis due to pollen: Secondary | ICD-10-CM | POA: Diagnosis not present

## 2018-01-12 DIAGNOSIS — J3089 Other allergic rhinitis: Secondary | ICD-10-CM | POA: Diagnosis not present

## 2018-01-19 DIAGNOSIS — J3089 Other allergic rhinitis: Secondary | ICD-10-CM | POA: Diagnosis not present

## 2018-01-19 DIAGNOSIS — J301 Allergic rhinitis due to pollen: Secondary | ICD-10-CM | POA: Diagnosis not present

## 2018-01-26 ENCOUNTER — Other Ambulatory Visit: Payer: Self-pay | Admitting: Physician Assistant

## 2018-01-26 DIAGNOSIS — M545 Low back pain, unspecified: Secondary | ICD-10-CM

## 2018-01-29 DIAGNOSIS — J301 Allergic rhinitis due to pollen: Secondary | ICD-10-CM | POA: Diagnosis not present

## 2018-01-29 DIAGNOSIS — J3089 Other allergic rhinitis: Secondary | ICD-10-CM | POA: Diagnosis not present

## 2018-02-12 DIAGNOSIS — J3089 Other allergic rhinitis: Secondary | ICD-10-CM | POA: Diagnosis not present

## 2018-02-12 DIAGNOSIS — J301 Allergic rhinitis due to pollen: Secondary | ICD-10-CM | POA: Diagnosis not present

## 2018-02-15 DIAGNOSIS — J3089 Other allergic rhinitis: Secondary | ICD-10-CM | POA: Diagnosis not present

## 2018-02-15 DIAGNOSIS — J301 Allergic rhinitis due to pollen: Secondary | ICD-10-CM | POA: Diagnosis not present

## 2018-02-15 DIAGNOSIS — H1045 Other chronic allergic conjunctivitis: Secondary | ICD-10-CM | POA: Diagnosis not present

## 2018-02-25 DIAGNOSIS — J3089 Other allergic rhinitis: Secondary | ICD-10-CM | POA: Diagnosis not present

## 2018-02-25 DIAGNOSIS — J301 Allergic rhinitis due to pollen: Secondary | ICD-10-CM | POA: Diagnosis not present

## 2018-03-02 ENCOUNTER — Other Ambulatory Visit: Payer: Self-pay | Admitting: Physician Assistant

## 2018-03-03 DIAGNOSIS — J301 Allergic rhinitis due to pollen: Secondary | ICD-10-CM | POA: Diagnosis not present

## 2018-03-04 DIAGNOSIS — J3089 Other allergic rhinitis: Secondary | ICD-10-CM | POA: Diagnosis not present

## 2018-03-10 DIAGNOSIS — J3089 Other allergic rhinitis: Secondary | ICD-10-CM | POA: Diagnosis not present

## 2018-03-10 DIAGNOSIS — J301 Allergic rhinitis due to pollen: Secondary | ICD-10-CM | POA: Diagnosis not present

## 2018-03-16 ENCOUNTER — Other Ambulatory Visit: Payer: Self-pay | Admitting: Physician Assistant

## 2018-03-16 NOTE — Telephone Encounter (Signed)
Last OV 01/07/2018 Last refill 12/13/2017 Ok to refill?

## 2018-03-18 DIAGNOSIS — J3089 Other allergic rhinitis: Secondary | ICD-10-CM | POA: Diagnosis not present

## 2018-03-18 DIAGNOSIS — J301 Allergic rhinitis due to pollen: Secondary | ICD-10-CM | POA: Diagnosis not present

## 2018-03-24 DIAGNOSIS — J301 Allergic rhinitis due to pollen: Secondary | ICD-10-CM | POA: Diagnosis not present

## 2018-03-24 DIAGNOSIS — J3089 Other allergic rhinitis: Secondary | ICD-10-CM | POA: Diagnosis not present

## 2018-04-05 ENCOUNTER — Other Ambulatory Visit: Payer: Self-pay | Admitting: Physician Assistant

## 2018-04-06 DIAGNOSIS — J3089 Other allergic rhinitis: Secondary | ICD-10-CM | POA: Diagnosis not present

## 2018-04-06 DIAGNOSIS — J301 Allergic rhinitis due to pollen: Secondary | ICD-10-CM | POA: Diagnosis not present

## 2018-04-14 DIAGNOSIS — J301 Allergic rhinitis due to pollen: Secondary | ICD-10-CM | POA: Diagnosis not present

## 2018-04-14 DIAGNOSIS — J3089 Other allergic rhinitis: Secondary | ICD-10-CM | POA: Diagnosis not present

## 2018-04-20 DIAGNOSIS — J3089 Other allergic rhinitis: Secondary | ICD-10-CM | POA: Diagnosis not present

## 2018-04-20 DIAGNOSIS — J301 Allergic rhinitis due to pollen: Secondary | ICD-10-CM | POA: Diagnosis not present

## 2018-05-06 DIAGNOSIS — J301 Allergic rhinitis due to pollen: Secondary | ICD-10-CM | POA: Diagnosis not present

## 2018-05-06 DIAGNOSIS — J3089 Other allergic rhinitis: Secondary | ICD-10-CM | POA: Diagnosis not present

## 2018-05-11 ENCOUNTER — Other Ambulatory Visit: Payer: Self-pay | Admitting: Physician Assistant

## 2018-05-14 DIAGNOSIS — J301 Allergic rhinitis due to pollen: Secondary | ICD-10-CM | POA: Diagnosis not present

## 2018-05-14 DIAGNOSIS — J3089 Other allergic rhinitis: Secondary | ICD-10-CM | POA: Diagnosis not present

## 2018-05-28 DIAGNOSIS — J301 Allergic rhinitis due to pollen: Secondary | ICD-10-CM | POA: Diagnosis not present

## 2018-05-28 DIAGNOSIS — J3089 Other allergic rhinitis: Secondary | ICD-10-CM | POA: Diagnosis not present

## 2018-06-04 DIAGNOSIS — J301 Allergic rhinitis due to pollen: Secondary | ICD-10-CM | POA: Diagnosis not present

## 2018-06-04 DIAGNOSIS — J3089 Other allergic rhinitis: Secondary | ICD-10-CM | POA: Diagnosis not present

## 2018-06-08 ENCOUNTER — Other Ambulatory Visit: Payer: Self-pay | Admitting: Physician Assistant

## 2018-06-08 DIAGNOSIS — M545 Low back pain, unspecified: Secondary | ICD-10-CM

## 2018-06-11 DIAGNOSIS — J301 Allergic rhinitis due to pollen: Secondary | ICD-10-CM | POA: Diagnosis not present

## 2018-06-11 DIAGNOSIS — J3089 Other allergic rhinitis: Secondary | ICD-10-CM | POA: Diagnosis not present

## 2018-06-14 ENCOUNTER — Other Ambulatory Visit: Payer: Self-pay | Admitting: Physician Assistant

## 2018-06-16 DIAGNOSIS — J301 Allergic rhinitis due to pollen: Secondary | ICD-10-CM | POA: Diagnosis not present

## 2018-06-16 DIAGNOSIS — J3089 Other allergic rhinitis: Secondary | ICD-10-CM | POA: Diagnosis not present

## 2018-06-25 DIAGNOSIS — J3089 Other allergic rhinitis: Secondary | ICD-10-CM | POA: Diagnosis not present

## 2018-06-25 DIAGNOSIS — J301 Allergic rhinitis due to pollen: Secondary | ICD-10-CM | POA: Diagnosis not present

## 2018-07-02 DIAGNOSIS — J301 Allergic rhinitis due to pollen: Secondary | ICD-10-CM | POA: Diagnosis not present

## 2018-07-02 DIAGNOSIS — J3089 Other allergic rhinitis: Secondary | ICD-10-CM | POA: Diagnosis not present

## 2018-07-08 ENCOUNTER — Ambulatory Visit: Payer: 59 | Admitting: Physician Assistant

## 2018-07-08 ENCOUNTER — Encounter: Payer: Self-pay | Admitting: Physician Assistant

## 2018-07-08 VITALS — BP 116/74 | HR 59 | Temp 97.7°F | Resp 16 | Ht 64.0 in | Wt 147.8 lb

## 2018-07-08 DIAGNOSIS — M8580 Other specified disorders of bone density and structure, unspecified site: Secondary | ICD-10-CM

## 2018-07-08 DIAGNOSIS — I10 Essential (primary) hypertension: Secondary | ICD-10-CM

## 2018-07-08 DIAGNOSIS — F419 Anxiety disorder, unspecified: Secondary | ICD-10-CM | POA: Diagnosis not present

## 2018-07-08 DIAGNOSIS — E039 Hypothyroidism, unspecified: Secondary | ICD-10-CM

## 2018-07-08 DIAGNOSIS — Z23 Encounter for immunization: Secondary | ICD-10-CM | POA: Diagnosis not present

## 2018-07-08 LAB — BASIC METABOLIC PANEL WITH GFR
BUN: 13 mg/dL (ref 7–25)
CO2: 27 mmol/L (ref 20–32)
Calcium: 9.2 mg/dL (ref 8.6–10.4)
Chloride: 104 mmol/L (ref 98–110)
Creat: 0.76 mg/dL (ref 0.50–0.99)
GFR, Est African American: 98 mL/min/{1.73_m2} (ref >=60)
GFR, Est Non African American: 85 mL/min/{1.73_m2} (ref >=60)
Glucose, Bld: 69 mg/dL (ref 65–99)
Potassium: 4 mmol/L (ref 3.5–5.3)
SODIUM: 141 mmol/L (ref 135–146)

## 2018-07-08 LAB — TSH: TSH: 0.89 mIU/L (ref 0.40–4.50)

## 2018-07-08 NOTE — Progress Notes (Signed)
Patient ID: Yvonne Wu MRN: [id number], DOB: 1956/07/16, 62 y.o. Date of Encounter: 07/08/2018,   Chief Complaint: Routine followup of blood pressure, thyroid.   HPI: 62 y.o. y/o white female  here for above.  (NOTE: I have been seeing her for years. She was having situational anxiety and depression because her husband had cancer--leukemia. He died of Leukemia 03/13/13.)  At visit May 2015 she had told me that she had gone off of her Wellbutrin and was feeling fine without the medication in regards to anxiety and depression symptoms.  Also reported that she is rarely needing her clonazepam.  Today she reports that all of this is remained the same. Says that she is feeling okay in regards to mood.   At [place] 12/2014 she was in the process of moving into a new house. Said that after her husband passed away, she needed to down size and get rid of a lot of stuff.  Built a new house near her mom. Her daughter and grandchildren live with patient. Pt states that her daughter has bipolar and that she "probably always will need to live with me " --More recently, this daughter and grandson have become patients of mine so I have gotten to know them as well.   At [place] 10/11/2015: Says she gets this area of itchy irritated skin occasionally. Applies moisturizer and it gets better but says that I had given her some steroid cream in the past and she is needing a refill on that. At [place] 06/2016--says she saw a specialist and is using this cream, which is working well---Desonide Cream 0.05%---Apply to affected area BID x 7 days  At [place] 10/11/2015: She is also requesting an increase in her dose of Celebrex. Says that she is on her feet all day and her feet hurt. Also occasional back pain and shoulder pain.  She is taking her blood pressure medication daily as directed. No lightheadedness or other adverse effects. She is taking her thyroid medication daily as directed. No unexplained changes in weight. No  palpitations. No changes in hair or skin.   10/09/2016: Today she is here just to discuss anxiety and depression. As stated above she had been on Wellbutrin in the past during the time that her husband was ill and passed away. However after that she had discontinued the Wellbutrin and was feeling fine off of that medication. Today she states that she has been feeling symptoms of anxiety and depression recently. Says that her granddaughter had a baby. Her granddaughter is 62 years old but patient says that "she has her own problems with her nerves "also says that she had problems with postpartum depression and now has been found to be anemic and is having bruises all over her body and is going to see Hematologist.   As well she will call patient and ask her to help watch the baby. Says that she ends up watching the baby, then has to go to work. Says "it's just too much for somebody at my age". Also says "I just can't help but worry". Also says that she had to put her dog down last week. She gets teary-eyed talking about this. Says that she had the dog for 13 years. Also says that her husband "loved that dog" and " a lot of memories with my husband with that dog"----all of these memories went through her mind with the dog dying.   12/31/2016: Reports that last week she called here and Dr. Dennard Schaumann  felt that she had influenza and called her in treatment dose of Tamiflu. States that she just completed this yesterday. Says the symptoms started last Thursday night which would have been 12/25/16. Says at that time she had 2 episodes of diarrhea. Then developed severe body aches, chills, and nausea but no vomiting. Has had no further diarrhea. Also had bad headache and her nostrils burned. Felt some drainage in her throat and upper chest region but no deep chest congestion and not no mucus from the nose. Says her nostrils have felt like they were burning so she has started using some nasal saline. Still having some  headache so taking Tylenol. Still feels weak has had no appetite and therefore has eaten very little and even hasn't wanted to drink much of anything. Getting no thick dark mucus from the nose and no thick dark phlegm from the chest. Brought in FMLA paper work for me to complete. Document her out of work starting Thursday 12/25/16 through Friday 01/02/17. Plan to return to work Monday 01/05/17.  Says that she said she has been sick it is been hard to even take pills so she had just stopped the Wellbutrin. Also wasn't sure she still needed it. However after discussing today she says that yes she probably should get back on it even though she has not been feeling as depressed recently and did talk to her granddaughter and told her to stop calling her all upset. Says this has helped, even though she does still worry about her.   Taking BP med as directed. No lightheadedness. Taking thyroid med as directed.      07/09/2017: She goes to [facility] for her GYN care. She had mammogram there are this year. Says that they mentioned [place] checking a vitamin D level. Otherwise she has no specific concerns to address today. She reports that her anxiety and depression have been stable even off the Wellbutrin. Reports that things at home with her daughter and grandchildren are stable. Taking BP med as directed. No lightheadedness. Taking thyroid med as directed.    01/07/2018: Reports that she has retired from her job.  Says that she thinks that things are actually just as stressful for her now as they had been.  At home it is her daughter who has bipolar disorder, her granddaughter who is 50 years old and also has bipolar disorder and her baby who is 16 months old.  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  However says that overall she does feel stable and feels like she is handling this as well as possible.  Feels  that her own anxiety and depression are stable/controlled. Continues to take blood pressure medication as directed.  This is causing no lightheadedness or other adverse effects. She continues to take her thyroid medication daily. She has no specific concerns to address today.   07/08/2018: She reports that her granddaughter was out of work for a while so patient has not had to babysit full-time---- has just been helping babysit some recently.   However, granddaughter is getting another job so patient will be babysitting again. (Says the granddaughter "had a nervous breakdown"--- had to be out of work but now is returning to work but has decided to get a different job at a [employer]) Continues to take blood pressure medication as directed.  This is causing no lightheadedness or other adverse effects. She continues to take  her thyroid medication daily. She has no specific concerns to address today.      Review of Systems: Consitutional: No fever, chills, fatigue, night sweats, lymphadenopathy. No significant/unexplained weight changes. Eyes: No visual changes, eye redness, or discharge. ENT/Mouth: No ear pain, sore throat, nasal drainage, or sinus pain. Cardiovascular: No chest pressure,heaviness, tightness or squeezing, even with exertion. No increased shortness of breath or dyspnea on exertion.No palpitations, edema, orthopnea, PND. Respiratory: No cough, hemoptysis, SOB, or wheezing. Gastrointestinal: No anorexia, dysphagia, reflux, pain, nausea, vomiting, hematemesis, diarrhea, constipation, BRBPR, or melena. Breast: No mass, nodules, bulging, or retraction. No skin changes or inflammation. No nipple discharge. No lymphadenopathy. Genitourinary: No dysuria, hematuria, incontinence, vaginal discharge, pruritis, burning, abnormal bleeding, or pain. Musculoskeletal: No decreased ROM, No joint pain or swelling. No significant pain in neck. Skin: No rash, pruritis, or concerning  lesions. Neurological: No headache, dizziness, syncope, seizures, tremors, memory loss, coordination problems, or paresthesias. Psychological: No anxiety, depression, hallucinations, SI/HI. Endocrine: No polydipsia, polyphagia, polyuria, or known diabetes.No increased fatigue. No palpitations/rapid heart rate. No significant/unexplained weight change. All other systems were reviewed and are otherwise negative.  Past Medical History:  Diagnosis Date  . Anxiety   . Depression    situational  . Hypertension   . Hypothyroid   . Urinary, incontinence, stress female 02/15/2015     Past Surgical History:  Procedure Laterality Date  . BLADDER SUSPENSION N/A 02/15/2015   Procedure: TRANSVAGINAL TAPE (TVT) Mid-Urethral Sling PROCEDURE;  Surgeon: Azucena Fallen, MD;  Location: [facility];  Service: Gynecology;  Laterality: N/A;  2 hrs.  . BUNIONECTOMY Left   . CYSTOCELE REPAIR N/A 02/15/2015   Procedure: ANTERIOR REPAIR (CYSTOCELE)/Colporrhaphy;  Surgeon: Azucena Fallen, MD;  Location: [facility];  Service: Gynecology;  Laterality: N/A;  . CYSTOSCOPY N/A 02/15/2015   Procedure: CYSTOSCOPY;  Surgeon: Azucena Fallen, MD;  Location: [facility];  Service: Gynecology;  Laterality: N/A;  . TUBAL LIGATION      Home Meds:  Current Outpatient Medications on File Prior to Visit  Medication Sig Dispense Refill  . acetaminophen (TYLENOL) 325 MG tablet Take 650 mg by mouth every 6 (six) hours as needed for mild pain or headache. Reported on 04/14/2016    . celecoxib (CELEBREX) 200 MG capsule TAKE (1) CAPSULE BY MOUTH TWICE DAILY. 60 capsule 0  . clonazePAM (KLONOPIN) 0.5 MG tablet TAKE (1) TABLET BY MOUTH ONCE DAILY AS NEEDED FOR ANXIETY. 30 tablet 2  . fexofenadine (ALLEGRA) 180 MG tablet Take 180 mg by mouth daily. Reported on 04/14/2016    . fluticasone (FLONASE) 50 MCG/ACT nasal spray USE 2 SPRAYS IN EACH NOSTRIL ONCE DAILY. 16 g 5  . MINOCYCLINE HCL PO Take by mouth 2 (two) times daily. Pt unsure of dose, from Derm for  skin    . olmesartan (BENICAR) 20 MG tablet TAKE ONE TABLET BY MOUTH ONCE DAILY. 30 tablet 0  . SYNTHROID 88 MCG tablet TAKE ONE TABLET BY MOUTH ONCE DAILY. 30 tablet 0   No current facility-administered medications on file prior to visit.     Allergies: No Known Allergies  Social History   Socioeconomic History  . Marital status: Widowed    Spouse name: Not on file  . Number of children: Not on file  . Years of education: Not on file  . Highest education level: Not on file  Occupational History  . Not on file  Social Needs  . Financial resource strain: Not on file  . Food insecurity:    Worry:  Not on file    Inability: Not on file  . Transportation needs:    Medical: Not on file    Non-medical: Not on file  Tobacco Use  . Smoking status: Former Smoker    Last attempt to quit: 02/22/1995    Years since quitting: 23.3  . Smokeless tobacco: Never Used  Substance and Sexual Activity  . Alcohol use: No  . Drug use: No  . Sexual activity: Not Currently  Lifestyle  . Physical activity:    Days per week: Not on file    Minutes per session: Not on file  . Stress: Not on file  Relationships  . Social connections:    Talks on phone: Not on file    Gets together: Not on file    Attends religious service: Not on file    Active member of club or organization: Not on file    Attends meetings of clubs or organizations: Not on file    Relationship status: Not on file  . Intimate partner violence:    Fear of current or ex partner: Not on file    Emotionally abused: Not on file    Physically abused: Not on file    Forced sexual activity: Not on file  Other Topics Concern  . Not on file  Social History Narrative   Works at [employer].   Involves a lot of standing and bending.   Husband passed away in 02-17-2013 with leukemia.   Patient states that her daughter is age 6 (as of 87) and has bipolar. This daughter lives with her.   This daughter has a child who is 87 years old  and another child he was 61 years old. They all live with the patient.   Patient also has a son who lives in [place].       Family History  Problem Relation Age of Onset  . Asthma Mother   . Mental illness Mother   . Cancer Father 85       lung cancer    Physical Exam: Blood pressure 116/74, pulse (!) 59, temperature 97.7 F (36.5 C), temperature source Oral, resp. rate 16, height 5\' 4"  (1.626 m), weight 67 kg, SpO2 99 %., Body mass index is 25.37 kg/m. General: WNWD WF  Appears in no acute distress. Neck: Supple. No thyromegaly. No lymphadenopathy. No carotid bruits. Lungs: Clear bilaterally to auscultation without wheezes, rales, or rhonchi. Breathing is unlabored. Heart: RRR with S1 S2. No murmurs, rubs, or gallops. Abdomen: Soft, non-tender, non-distended with normoactive bowel sounds. No hepatomegaly. No rebound/guarding. No obvious abdominal masses. Musculoskeletal:  Strength and tone normal for age. Extremities/Skin: Warm and dry.  No edema.  Neuro: Alert and oriented X 3. Moves all extremities spontaneously. Gait is normal. CNII-XII grossly in tact. Psych:  Responds to questions appropriately with a normal affect.    Assessment/Plan:    Anxiety 07/08/2018: Stable/Controlled. Off Wellbutrin.   Moderate episode of recurrent major depressive disorder ([place]) 07/08/2018:  Stable/Controlled. Off medication/ off Wellbutrin.   Hypertension 07/08/2018:  Blood Pressure is at goal. Continue current medication. Check labs monitor.  - COMPLETE METABOLIC PANEL WITH GFR  Hypothyroid 07/08/2018:  She is taking thyroid medication daily. Check lab to monitor.  - TSH  Vitamin D deficiency 12/31/2016: - She is taking vitamin D supplements. 07/09/2017--- she reports that the vitamin D supplement calcium with vitamin D and it causes some constipation. Recheck vitamin D level now. Recommended that she increase water fruits and vegetable fiber  intake to help with constipation.  Favorable  lipid status.  She had a complete physical exam and fasting labs 07/2013. Lipids were good with LDL 128 and all other parameters normal.    Low back pain, Pain in Feet, Pain in Shoulder 12/31/2016: Reviewed---At prior OV--- Increased Celebrex to twice a day dosing.   Preventive Care:   B. Pap- Per Gyn C. Mammogram: Per Gyn D. DEXA: Per Gyn                                      E. screening colonoscopy: This was done December 2008. Normal. Repeat 10 years.--------------------------------WILL BE DUE TO DISCUSS THIS AT NEXT OV--------------Reminded her at [place] 07/09/2017---- --------------------------------------------------------------------------------07/08/2018: Discussed again today. She says she will go home and call to schedule--says it was [facility]. She is aware.  F. Immunizations: Influenza----Given here 07/09/2017, 07/08/2018 Tetanus vaccine: --She received T dap 2012 Pneumovax--Willl give it age 23. She does not smoke. She has no indication to need this until age 3. Shingrix--Discussed at [place] 07/09/2017--- she is to check cough/coverage with her insurance and then let us know.  ROV 6 months, sooner if needed.  Signed, [address] [place], [place], [facility] 07/08/2018 11:50 AM

## 2018-07-09 DIAGNOSIS — J301 Allergic rhinitis due to pollen: Secondary | ICD-10-CM | POA: Diagnosis not present

## 2018-07-09 DIAGNOSIS — J3089 Other allergic rhinitis: Secondary | ICD-10-CM | POA: Diagnosis not present

## 2018-07-13 ENCOUNTER — Encounter (INDEPENDENT_AMBULATORY_CARE_PROVIDER_SITE_OTHER): Payer: Self-pay | Admitting: *Deleted

## 2018-07-13 ENCOUNTER — Telehealth: Payer: Self-pay

## 2018-07-13 DIAGNOSIS — Z1211 Encounter for screening for malignant neoplasm of colon: Secondary | ICD-10-CM

## 2018-07-13 NOTE — Telephone Encounter (Signed)
Call was placed to patient to discuss her lab results. She then asked if I could send a referral to Dr. Ileene Patrick office for her colonoscopy. Referral has been placed

## 2018-07-14 NOTE — Telephone Encounter (Signed)
Approved.  

## 2018-07-16 DIAGNOSIS — J3089 Other allergic rhinitis: Secondary | ICD-10-CM | POA: Diagnosis not present

## 2018-07-16 DIAGNOSIS — J301 Allergic rhinitis due to pollen: Secondary | ICD-10-CM | POA: Diagnosis not present

## 2018-07-17 ENCOUNTER — Other Ambulatory Visit: Payer: Self-pay | Admitting: Physician Assistant

## 2018-07-20 DIAGNOSIS — J3089 Other allergic rhinitis: Secondary | ICD-10-CM | POA: Diagnosis not present

## 2018-07-20 DIAGNOSIS — J301 Allergic rhinitis due to pollen: Secondary | ICD-10-CM | POA: Diagnosis not present

## 2018-07-30 DIAGNOSIS — J3089 Other allergic rhinitis: Secondary | ICD-10-CM | POA: Diagnosis not present

## 2018-07-30 DIAGNOSIS — J301 Allergic rhinitis due to pollen: Secondary | ICD-10-CM | POA: Diagnosis not present

## 2018-08-06 DIAGNOSIS — J3089 Other allergic rhinitis: Secondary | ICD-10-CM | POA: Diagnosis not present

## 2018-08-06 DIAGNOSIS — J301 Allergic rhinitis due to pollen: Secondary | ICD-10-CM | POA: Diagnosis not present

## 2018-08-10 ENCOUNTER — Other Ambulatory Visit: Payer: Self-pay | Admitting: Physician Assistant

## 2018-08-10 DIAGNOSIS — M545 Low back pain, unspecified: Secondary | ICD-10-CM

## 2018-08-13 DIAGNOSIS — J301 Allergic rhinitis due to pollen: Secondary | ICD-10-CM | POA: Diagnosis not present

## 2018-08-13 DIAGNOSIS — J3089 Other allergic rhinitis: Secondary | ICD-10-CM | POA: Diagnosis not present

## 2018-08-19 DIAGNOSIS — J3089 Other allergic rhinitis: Secondary | ICD-10-CM | POA: Diagnosis not present

## 2018-08-19 DIAGNOSIS — J301 Allergic rhinitis due to pollen: Secondary | ICD-10-CM | POA: Diagnosis not present

## 2018-08-19 DIAGNOSIS — M19072 Primary osteoarthritis, left ankle and foot: Secondary | ICD-10-CM | POA: Diagnosis not present

## 2018-08-19 DIAGNOSIS — M79672 Pain in left foot: Secondary | ICD-10-CM | POA: Diagnosis not present

## 2018-08-23 ENCOUNTER — Other Ambulatory Visit: Payer: Self-pay | Admitting: Physician Assistant

## 2018-08-27 DIAGNOSIS — J301 Allergic rhinitis due to pollen: Secondary | ICD-10-CM | POA: Diagnosis not present

## 2018-08-27 DIAGNOSIS — J3089 Other allergic rhinitis: Secondary | ICD-10-CM | POA: Diagnosis not present

## 2018-08-31 DIAGNOSIS — J3089 Other allergic rhinitis: Secondary | ICD-10-CM | POA: Diagnosis not present

## 2018-08-31 DIAGNOSIS — J301 Allergic rhinitis due to pollen: Secondary | ICD-10-CM | POA: Diagnosis not present

## 2018-09-09 DIAGNOSIS — J3089 Other allergic rhinitis: Secondary | ICD-10-CM | POA: Diagnosis not present

## 2018-09-09 DIAGNOSIS — J301 Allergic rhinitis due to pollen: Secondary | ICD-10-CM | POA: Diagnosis not present

## 2018-09-20 DIAGNOSIS — M79672 Pain in left foot: Secondary | ICD-10-CM | POA: Diagnosis not present

## 2018-09-24 DIAGNOSIS — J301 Allergic rhinitis due to pollen: Secondary | ICD-10-CM | POA: Diagnosis not present

## 2018-09-24 DIAGNOSIS — J3089 Other allergic rhinitis: Secondary | ICD-10-CM | POA: Diagnosis not present

## 2018-09-29 ENCOUNTER — Other Ambulatory Visit: Payer: Self-pay | Admitting: Family Medicine

## 2018-09-29 DIAGNOSIS — M545 Low back pain, unspecified: Secondary | ICD-10-CM

## 2018-09-29 DIAGNOSIS — J3089 Other allergic rhinitis: Secondary | ICD-10-CM | POA: Diagnosis not present

## 2018-09-29 DIAGNOSIS — J301 Allergic rhinitis due to pollen: Secondary | ICD-10-CM | POA: Diagnosis not present

## 2018-10-01 ENCOUNTER — Other Ambulatory Visit: Payer: Self-pay | Admitting: Family Medicine

## 2018-10-01 ENCOUNTER — Other Ambulatory Visit: Payer: Self-pay | Admitting: *Deleted

## 2018-10-01 NOTE — Telephone Encounter (Signed)
Ok to refill??  Last office visit 07/08/2018.  Last refill 03/16/2018, #2 refills.

## 2018-10-04 MED ORDER — CLONAZEPAM 0.5 MG PO TABS: ORAL_TABLET | ORAL | 2 refills | Status: DC

## 2018-10-14 DIAGNOSIS — J301 Allergic rhinitis due to pollen: Secondary | ICD-10-CM | POA: Diagnosis not present

## 2018-10-14 DIAGNOSIS — J3089 Other allergic rhinitis: Secondary | ICD-10-CM | POA: Diagnosis not present

## 2018-10-21 DIAGNOSIS — J3089 Other allergic rhinitis: Secondary | ICD-10-CM | POA: Diagnosis not present

## 2018-10-21 DIAGNOSIS — J301 Allergic rhinitis due to pollen: Secondary | ICD-10-CM | POA: Diagnosis not present

## 2018-10-22 DIAGNOSIS — J3089 Other allergic rhinitis: Secondary | ICD-10-CM | POA: Diagnosis not present

## 2018-10-28 DIAGNOSIS — Z01419 Encounter for gynecological examination (general) (routine) without abnormal findings: Secondary | ICD-10-CM | POA: Diagnosis not present

## 2018-10-28 DIAGNOSIS — J3089 Other allergic rhinitis: Secondary | ICD-10-CM | POA: Diagnosis not present

## 2018-10-28 DIAGNOSIS — Z1231 Encounter for screening mammogram for malignant neoplasm of breast: Secondary | ICD-10-CM | POA: Diagnosis not present

## 2018-10-28 DIAGNOSIS — J301 Allergic rhinitis due to pollen: Secondary | ICD-10-CM | POA: Diagnosis not present

## 2018-10-28 LAB — HM MAMMOGRAPHY

## 2018-10-28 LAB — HM PAP SMEAR: HM Pap smear: NEGATIVE

## 2018-11-01 ENCOUNTER — Other Ambulatory Visit: Payer: Self-pay | Admitting: Family Medicine

## 2018-11-01 DIAGNOSIS — J3089 Other allergic rhinitis: Secondary | ICD-10-CM | POA: Diagnosis not present

## 2018-11-01 DIAGNOSIS — J301 Allergic rhinitis due to pollen: Secondary | ICD-10-CM | POA: Diagnosis not present

## 2018-11-03 ENCOUNTER — Other Ambulatory Visit: Payer: Self-pay | Admitting: Obstetrics and Gynecology

## 2018-11-03 DIAGNOSIS — M8580 Other specified disorders of bone density and structure, unspecified site: Secondary | ICD-10-CM

## 2018-11-09 ENCOUNTER — Encounter: Payer: Self-pay | Admitting: *Deleted

## 2018-11-11 DIAGNOSIS — J3089 Other allergic rhinitis: Secondary | ICD-10-CM | POA: Diagnosis not present

## 2018-11-11 DIAGNOSIS — J301 Allergic rhinitis due to pollen: Secondary | ICD-10-CM | POA: Diagnosis not present

## 2018-11-19 DIAGNOSIS — J301 Allergic rhinitis due to pollen: Secondary | ICD-10-CM | POA: Diagnosis not present

## 2018-11-19 DIAGNOSIS — J3089 Other allergic rhinitis: Secondary | ICD-10-CM | POA: Diagnosis not present

## 2018-11-26 DIAGNOSIS — J301 Allergic rhinitis due to pollen: Secondary | ICD-10-CM | POA: Diagnosis not present

## 2018-11-26 DIAGNOSIS — J3089 Other allergic rhinitis: Secondary | ICD-10-CM | POA: Diagnosis not present

## 2018-11-29 ENCOUNTER — Ambulatory Visit
Admission: RE | Admit: 2018-11-29 | Discharge: 2018-11-29 | Disposition: A | Discharge status: Home / Self Care | Payer: 59 | Source: Ambulatory Visit | Attending: Obstetrics and Gynecology | Admitting: Obstetrics and Gynecology

## 2018-11-29 DIAGNOSIS — Z780 Asymptomatic menopausal state: Secondary | ICD-10-CM | POA: Diagnosis not present

## 2018-11-29 DIAGNOSIS — M8589 Other specified disorders of bone density and structure, multiple sites: Secondary | ICD-10-CM | POA: Diagnosis not present

## 2018-11-29 DIAGNOSIS — M8580 Other specified disorders of bone density and structure, unspecified site: Secondary | ICD-10-CM

## 2018-11-29 LAB — HM DEXA SCAN

## 2018-12-03 DIAGNOSIS — M899 Disorder of bone, unspecified: Secondary | ICD-10-CM | POA: Diagnosis not present

## 2018-12-03 DIAGNOSIS — J301 Allergic rhinitis due to pollen: Secondary | ICD-10-CM | POA: Diagnosis not present

## 2018-12-03 DIAGNOSIS — M8580 Other specified disorders of bone density and structure, unspecified site: Secondary | ICD-10-CM | POA: Diagnosis not present

## 2018-12-03 DIAGNOSIS — J3089 Other allergic rhinitis: Secondary | ICD-10-CM | POA: Diagnosis not present

## 2018-12-04 ENCOUNTER — Other Ambulatory Visit: Payer: Self-pay | Admitting: Family Medicine

## 2018-12-04 DIAGNOSIS — M545 Low back pain, unspecified: Secondary | ICD-10-CM

## 2018-12-10 DIAGNOSIS — J301 Allergic rhinitis due to pollen: Secondary | ICD-10-CM | POA: Diagnosis not present

## 2018-12-10 DIAGNOSIS — J3089 Other allergic rhinitis: Secondary | ICD-10-CM | POA: Diagnosis not present

## 2018-12-24 DIAGNOSIS — J301 Allergic rhinitis due to pollen: Secondary | ICD-10-CM | POA: Diagnosis not present

## 2018-12-24 DIAGNOSIS — J3089 Other allergic rhinitis: Secondary | ICD-10-CM | POA: Diagnosis not present

## 2018-12-27 DIAGNOSIS — K5730 Diverticulosis of large intestine without perforation or abscess without bleeding: Secondary | ICD-10-CM | POA: Diagnosis not present

## 2018-12-27 DIAGNOSIS — Z1211 Encounter for screening for malignant neoplasm of colon: Secondary | ICD-10-CM | POA: Diagnosis not present

## 2018-12-27 LAB — HM COLONOSCOPY

## 2018-12-31 DIAGNOSIS — J301 Allergic rhinitis due to pollen: Secondary | ICD-10-CM | POA: Diagnosis not present

## 2018-12-31 DIAGNOSIS — J3089 Other allergic rhinitis: Secondary | ICD-10-CM | POA: Diagnosis not present

## 2019-01-06 DIAGNOSIS — J3089 Other allergic rhinitis: Secondary | ICD-10-CM | POA: Diagnosis not present

## 2019-01-06 DIAGNOSIS — J301 Allergic rhinitis due to pollen: Secondary | ICD-10-CM | POA: Diagnosis not present

## 2019-01-08 ENCOUNTER — Other Ambulatory Visit: Payer: Self-pay | Admitting: Family Medicine

## 2019-01-10 ENCOUNTER — Encounter: Payer: Self-pay | Admitting: *Deleted

## 2019-01-11 ENCOUNTER — Other Ambulatory Visit: Payer: Self-pay | Admitting: Family Medicine

## 2019-02-04 DIAGNOSIS — J301 Allergic rhinitis due to pollen: Secondary | ICD-10-CM | POA: Diagnosis not present

## 2019-02-04 DIAGNOSIS — J3089 Other allergic rhinitis: Secondary | ICD-10-CM | POA: Diagnosis not present

## 2019-02-11 ENCOUNTER — Other Ambulatory Visit: Payer: Self-pay | Admitting: Family Medicine

## 2019-02-11 DIAGNOSIS — M545 Low back pain, unspecified: Secondary | ICD-10-CM

## 2019-02-16 DIAGNOSIS — H1045 Other chronic allergic conjunctivitis: Secondary | ICD-10-CM | POA: Diagnosis not present

## 2019-02-16 DIAGNOSIS — J3089 Other allergic rhinitis: Secondary | ICD-10-CM | POA: Diagnosis not present

## 2019-02-16 DIAGNOSIS — J301 Allergic rhinitis due to pollen: Secondary | ICD-10-CM | POA: Diagnosis not present

## 2019-02-25 DIAGNOSIS — J301 Allergic rhinitis due to pollen: Secondary | ICD-10-CM | POA: Diagnosis not present

## 2019-02-25 DIAGNOSIS — J3089 Other allergic rhinitis: Secondary | ICD-10-CM | POA: Diagnosis not present

## 2019-02-28 DIAGNOSIS — M79672 Pain in left foot: Secondary | ICD-10-CM | POA: Diagnosis not present

## 2019-03-10 DIAGNOSIS — M19072 Primary osteoarthritis, left ankle and foot: Secondary | ICD-10-CM | POA: Diagnosis not present

## 2019-03-29 ENCOUNTER — Other Ambulatory Visit: Payer: Self-pay

## 2019-03-29 ENCOUNTER — Ambulatory Visit: Payer: 59 | Admitting: Family Medicine

## 2019-03-29 VITALS — BP 120/76 | HR 52 | Temp 97.6°F | Resp 18 | Ht 64.0 in | Wt 152.0 lb

## 2019-03-29 DIAGNOSIS — I10 Essential (primary) hypertension: Secondary | ICD-10-CM

## 2019-03-29 DIAGNOSIS — F419 Anxiety disorder, unspecified: Secondary | ICD-10-CM

## 2019-03-29 DIAGNOSIS — E039 Hypothyroidism, unspecified: Secondary | ICD-10-CM | POA: Diagnosis not present

## 2019-03-29 MED ORDER — ESCITALOPRAM OXALATE 10 MG PO TABS: 10.0000 mg | ORAL_TABLET | Freq: Every day | ORAL | 2 refills | Status: DC

## 2019-03-29 NOTE — Progress Notes (Signed)
Subjective:    Patient ID: Yvonne Wu, female    DOB: Feb 25, 1956, 63 y.o.   MRN: [id number]  HPI  Patient is a very pleasant 63 year old Caucasian female here today to establish care with me.  She was previously seen my partner who is retired.  Her mammogram is up-to-date.  She gets her Pap smear performed at her gynecologist.  Her immunizations are up-to-date.  She has a history of hypertension however blood pressure today is well controlled.  She denies any chest pain shortness of breath or dyspnea on exertion.  She also has hypothyroidism but she denies any fatigue or weight loss or weight gain or hair loss.  She does complain of anxiety.  She is currently caring for her mother who is battling Alzheimer's disease.  She feels stressed on a daily basis and feels anxious on a daily basis.  She feels like she needs to take the Klonopin every day however she is afraid to do that due to the risk of dependency and habituation.  He denies any depression or suicidal ideation Past Medical History:  Diagnosis Date  . Anxiety   . Depression    situational  . Eczema   . Hypertension   . Hypothyroid   . Urinary, incontinence, stress female 02/15/2015   Past Surgical History:  Procedure Laterality Date  . BLADDER SUSPENSION N/A 02/15/2015   Procedure: TRANSVAGINAL TAPE (TVT) Mid-Urethral Sling PROCEDURE;  Surgeon: Azucena Fallen, MD;  Location: [facility];  Service: Gynecology;  Laterality: N/A;  2 hrs.  . BUNIONECTOMY Left   . CYSTOCELE REPAIR N/A 02/15/2015   Procedure: ANTERIOR REPAIR (CYSTOCELE)/Colporrhaphy;  Surgeon: Azucena Fallen, MD;  Location: [facility];  Service: Gynecology;  Laterality: N/A;  . CYSTOSCOPY N/A 02/15/2015   Procedure: CYSTOSCOPY;  Surgeon: Azucena Fallen, MD;  Location: [facility];  Service: Gynecology;  Laterality: N/A;  . TUBAL LIGATION     Current Outpatient Medications on File Prior to Visit  Medication Sig Dispense Refill  . acetaminophen (TYLENOL) 325 MG tablet Take 650 mg by  mouth every 6 (six) hours as needed for mild pain or headache. Reported on 04/14/2016    . celecoxib (CELEBREX) 200 MG capsule TAKE (1) CAPSULE BY MOUTH TWICE DAILY. 60 capsule 0  . clonazePAM (KLONOPIN) 0.5 MG tablet TAKE (1) TABLET BY MOUTH ONCE DAILY AS NEEDED FOR ANXIETY. 30 tablet 2  . fexofenadine (ALLEGRA) 180 MG tablet Take 180 mg by mouth daily. Reported on 04/14/2016    . fluticasone (FLONASE) 50 MCG/ACT nasal spray USE 2 SPRAYS IN EACH NOSTRIL ONCE DAILY. 16 g 5  . MINOCYCLINE HCL PO Take by mouth 2 (two) times daily. Pt unsure of dose, from Derm for skin    . olmesartan (BENICAR) 20 MG tablet TAKE ONE TABLET BY MOUTH ONCE DAILY. 90 tablet 1  . SYNTHROID 88 MCG tablet TAKE ONE TABLET BY MOUTH ONCE DAILY. 30 tablet 3   No current facility-administered medications on file prior to visit.    No Known Allergies Social History   Socioeconomic History  . Marital status: Widowed    Spouse name: Not on file  . Number of children: Not on file  . Years of education: Not on file  . Highest education level: Not on file  Occupational History  . Not on file  Social Needs  . Financial resource strain: Not on file  . Food insecurity:    Worry: Not on file    Inability: Not on file  . Transportation needs:  Medical: Not on file    Non-medical: Not on file  Tobacco Use  . Smoking status: Former Smoker    Last attempt to quit: 02/22/1995    Years since quitting: 24.1  . Smokeless tobacco: Never Used  Substance and Sexual Activity  . Alcohol use: No  . Drug use: No  . Sexual activity: Not Currently  Lifestyle  . Physical activity:    Days per week: Not on file    Minutes per session: Not on file  . Stress: Not on file  Relationships  . Social connections:    Talks on phone: Not on file    Gets together: Not on file    Attends religious service: Not on file    Active member of club or organization: Not on file    Attends meetings of clubs or organizations: Not on file     Relationship status: Not on file  . Intimate partner violence:    Fear of current or ex partner: Not on file    Emotionally abused: Not on file    Physically abused: Not on file    Forced sexual activity: Not on file  Other Topics Concern  . Not on file  Social History Narrative   Works at [employer].   Involves a lot of standing and bending.   Husband passed away in Mar 11, 2013 with leukemia.   Patient states that her daughter is age 33 (as of 60) and has bipolar. This daughter lives with her.   This daughter has a child who is 54 years old and another child he was 49 years old. They all live with the patient.   Patient also has a son who lives in [place].       Review of Systems  All other systems reviewed and are negative.      Objective:   Physical Exam Vitals signs reviewed.  Constitutional:      General: She is not in acute distress.    Appearance: Normal appearance. She is normal weight. She is not ill-appearing, toxic-appearing or diaphoretic.  Cardiovascular:     Rate and Rhythm: Normal rate and regular rhythm.     Pulses: Normal pulses.     Heart sounds: Normal heart sounds. No murmur. No gallop.   Pulmonary:     Effort: Pulmonary effort is normal. No respiratory distress.     Breath sounds: Normal breath sounds. No stridor. No wheezing, rhonchi or rales.  Chest:     Chest wall: No tenderness.  Abdominal:     General: There is no distension.     Tenderness: There is no abdominal tenderness.  Musculoskeletal:     Right lower leg: No edema.     Left lower leg: No edema.  Neurological:     Mental Status: She is alert.           Assessment & Plan:  Essential hypertension - Plan: CBC with Differential/Platelet, COMPLETE METABOLIC PANEL WITH GFR, Lipid panel  Hypothyroidism, unspecified type - Plan: TSH  Anxiety  Blood pressure today is well controlled.  I will make no changes in her Benicar.  I will check a CMP and a fasting lipid panel to screen her  cholesterol.  Her hypothyroidism sounds well controlled from a symptomatic standpoint however I will check a TSH.  I do believe the patient is dealing more with anxiety and rather than increase the frequency of Klonopin given the fact is occurring on a daily basis I recommended trying Lexapro 10  mg a day for generalized anxiety disorder/situational anxiety and see if this helps lessen the frequency and severity of her anxiety.  She will recheck with me in 4 weeks and let me know if the medication is helping.  She can continue to use Klonopin sparingly as needed for overwhelming anxiety

## 2019-03-30 LAB — CBC WITH DIFFERENTIAL/PLATELET
Absolute Monocytes: 352 cells/uL (ref 200–950)
Basophils Absolute: 30 cells/uL (ref 0–200)
Basophils Relative: 0.8 %
Eosinophils Absolute: 122 cells/uL (ref 15–500)
Eosinophils Relative: 3.3 %
HCT: 38.5 % (ref 35.0–45.0)
Hemoglobin: 12.8 g/dL (ref 11.7–15.5)
Lymphs Abs: 1421 cells/uL (ref 850–3900)
MCH: 29.8 pg (ref 27.0–33.0)
MCHC: 33.2 g/dL (ref 32.0–36.0)
MCV: 89.7 fL (ref 80.0–100.0)
MPV: 10 fL (ref 7.5–12.5)
Monocytes Relative: 9.5 %
Neutro Abs: 1776 cells/uL (ref 1500–7800)
Neutrophils Relative %: 48 %
Platelets: 254 10*3/uL (ref 140–400)
RBC: 4.29 10*6/uL (ref 3.80–5.10)
RDW: 13.1 % (ref 11.0–15.0)
Total Lymphocyte: 38.4 %
WBC: 3.7 10*3/uL — ABNORMAL LOW (ref 3.8–10.8)

## 2019-03-30 LAB — LIPID PANEL
Cholesterol: 219 mg/dL — ABNORMAL HIGH (ref <200)
HDL: 73 mg/dL (ref >=50)
LDL Cholesterol (Calc): 131 mg/dL (calc) — ABNORMAL HIGH
Non-HDL Cholesterol (Calc): 146 mg/dL (calc) — ABNORMAL HIGH (ref <130)
Total CHOL/HDL Ratio: 3 (calc) (ref <5.0)
Triglycerides: 60 mg/dL (ref <150)

## 2019-03-30 LAB — COMPLETE METABOLIC PANEL WITH GFR
AG Ratio: 2.1 (calc) (ref 1.0–2.5)
ALT: 13 U/L (ref 6–29)
AST: 17 U/L (ref 10–35)
Albumin: 4.4 g/dL (ref 3.6–5.1)
Alkaline phosphatase (APISO): 74 U/L (ref 37–153)
BUN: 12 mg/dL (ref 7–25)
CO2: 25 mmol/L (ref 20–32)
Calcium: 9.2 mg/dL (ref 8.6–10.4)
Chloride: 106 mmol/L (ref 98–110)
Creat: 0.68 mg/dL (ref 0.50–0.99)
GFR, Est African American: 109 mL/min/{1.73_m2} (ref >=60)
GFR, Est Non African American: 94 mL/min/{1.73_m2} (ref >=60)
Globulin: 2.1 g/dL (calc) (ref 1.9–3.7)
Glucose, Bld: 79 mg/dL (ref 65–99)
Potassium: 4.7 mmol/L (ref 3.5–5.3)
Sodium: 141 mmol/L (ref 135–146)
Total Bilirubin: 0.5 mg/dL (ref 0.2–1.2)
Total Protein: 6.5 g/dL (ref 6.1–8.1)

## 2019-03-30 LAB — TSH: TSH: 5.83 mIU/L — ABNORMAL HIGH (ref 0.40–4.50)

## 2019-04-25 ENCOUNTER — Telehealth: Payer: Self-pay | Admitting: Family Medicine

## 2019-04-25 ENCOUNTER — Other Ambulatory Visit: Payer: Self-pay | Admitting: Family Medicine

## 2019-04-25 DIAGNOSIS — M545 Low back pain, unspecified: Secondary | ICD-10-CM

## 2019-04-25 NOTE — Telephone Encounter (Signed)
Pt called Yvonne Wu stating that she wanted to let you know that she did not start the Lexapro. She did not want to try it.

## 2019-05-24 ENCOUNTER — Other Ambulatory Visit: Payer: Self-pay | Admitting: Family Medicine

## 2019-05-25 NOTE — Telephone Encounter (Signed)
Requesting refill  Klonopin  LOV:  03/29/2019  LRF:   10/04/18

## 2019-06-04 ENCOUNTER — Other Ambulatory Visit: Payer: Self-pay | Admitting: Family Medicine

## 2019-07-08 ENCOUNTER — Other Ambulatory Visit: Payer: Self-pay | Admitting: Family Medicine

## 2019-07-08 DIAGNOSIS — M545 Low back pain, unspecified: Secondary | ICD-10-CM

## 2019-07-19 ENCOUNTER — Ambulatory Visit: Payer: 59

## 2019-07-20 ENCOUNTER — Other Ambulatory Visit: Payer: Self-pay

## 2019-07-20 ENCOUNTER — Ambulatory Visit (INDEPENDENT_AMBULATORY_CARE_PROVIDER_SITE_OTHER): Payer: 59

## 2019-07-20 DIAGNOSIS — Z23 Encounter for immunization: Secondary | ICD-10-CM | POA: Diagnosis not present

## 2019-08-31 DIAGNOSIS — M25551 Pain in right hip: Secondary | ICD-10-CM | POA: Insufficient documentation

## 2019-09-01 ENCOUNTER — Other Ambulatory Visit: Payer: Self-pay | Admitting: Orthopedic Surgery

## 2019-09-01 DIAGNOSIS — M259 Joint disorder, unspecified: Secondary | ICD-10-CM

## 2019-09-13 ENCOUNTER — Other Ambulatory Visit: Payer: 59

## 2019-09-17 ENCOUNTER — Other Ambulatory Visit: Payer: Self-pay | Admitting: Family Medicine

## 2019-09-17 DIAGNOSIS — M545 Low back pain, unspecified: Secondary | ICD-10-CM

## 2019-09-19 ENCOUNTER — Ambulatory Visit
Admission: RE | Admit: 2019-09-19 | Discharge: 2019-09-19 | Disposition: A | Discharge status: Home / Self Care | Payer: 59 | Source: Ambulatory Visit | Attending: Orthopedic Surgery | Admitting: Orthopedic Surgery

## 2019-09-19 ENCOUNTER — Other Ambulatory Visit: Payer: Self-pay

## 2019-09-19 DIAGNOSIS — M259 Joint disorder, unspecified: Secondary | ICD-10-CM

## 2019-10-08 ENCOUNTER — Other Ambulatory Visit: Payer: Self-pay | Admitting: Family Medicine

## 2019-11-01 ENCOUNTER — Other Ambulatory Visit: Payer: Self-pay | Admitting: Family Medicine

## 2019-11-01 NOTE — Telephone Encounter (Signed)
Ok to refill??  Last office visit 03/29/2019.  Last refill 05/26/2019.

## 2019-11-24 LAB — HM MAMMOGRAPHY

## 2020-01-30 ENCOUNTER — Other Ambulatory Visit: Payer: Self-pay | Admitting: Family Medicine

## 2020-01-31 NOTE — Telephone Encounter (Signed)
Ok to refill??  Last office visit 03/29/2019.  Last refill 11/03/2019.

## 2020-02-28 ENCOUNTER — Other Ambulatory Visit: Payer: Self-pay | Admitting: Family Medicine

## 2020-04-02 ENCOUNTER — Ambulatory Visit (INDEPENDENT_AMBULATORY_CARE_PROVIDER_SITE_OTHER): Payer: 59 | Admitting: Family Medicine

## 2020-04-02 ENCOUNTER — Other Ambulatory Visit: Payer: Self-pay

## 2020-04-02 VITALS — BP 120/80 | HR 67 | Temp 97.2°F | Wt 163.0 lb

## 2020-04-02 DIAGNOSIS — E7800 Pure hypercholesterolemia, unspecified: Secondary | ICD-10-CM | POA: Diagnosis not present

## 2020-04-02 DIAGNOSIS — I10 Essential (primary) hypertension: Secondary | ICD-10-CM

## 2020-04-02 DIAGNOSIS — F419 Anxiety disorder, unspecified: Secondary | ICD-10-CM

## 2020-04-02 DIAGNOSIS — Z0001 Encounter for general adult medical examination with abnormal findings: Secondary | ICD-10-CM

## 2020-04-02 DIAGNOSIS — Z0000 Encounter for general adult medical examination without abnormal findings: Secondary | ICD-10-CM

## 2020-04-02 DIAGNOSIS — M8580 Other specified disorders of bone density and structure, unspecified site: Secondary | ICD-10-CM

## 2020-04-02 DIAGNOSIS — E038 Other specified hypothyroidism: Secondary | ICD-10-CM | POA: Diagnosis not present

## 2020-04-02 LAB — COMPLETE METABOLIC PANEL WITHOUT GFR
AG Ratio: 1.9 (calc) (ref 1.0–2.5)
ALT: 16 U/L (ref 6–29)
AST: 17 U/L (ref 10–35)
Albumin: 4.5 g/dL (ref 3.6–5.1)
Alkaline phosphatase (APISO): 79 U/L (ref 37–153)
BUN: 14 mg/dL (ref 7–25)
CO2: 26 mmol/L (ref 20–32)
Calcium: 9.3 mg/dL (ref 8.6–10.4)
Chloride: 106 mmol/L (ref 98–110)
Creat: 0.71 mg/dL (ref 0.50–0.99)
GFR, Est African American: 105 mL/min/{1.73_m2}
GFR, Est Non African American: 91 mL/min/{1.73_m2}
Globulin: 2.4 g/dL (ref 1.9–3.7)
Glucose, Bld: 88 mg/dL (ref 65–99)
Potassium: 3.9 mmol/L (ref 3.5–5.3)
Sodium: 141 mmol/L (ref 135–146)
Total Bilirubin: 0.5 mg/dL (ref 0.2–1.2)
Total Protein: 6.9 g/dL (ref 6.1–8.1)

## 2020-04-02 LAB — CBC WITH DIFFERENTIAL/PLATELET
Absolute Monocytes: 401 {cells}/uL (ref 200–950)
Basophils Absolute: 32 {cells}/uL (ref 0–200)
Basophils Relative: 0.7 %
Eosinophils Absolute: 90 {cells}/uL (ref 15–500)
Eosinophils Relative: 2 %
HCT: 39.6 % (ref 35.0–45.0)
Hemoglobin: 13 g/dL (ref 11.7–15.5)
Lymphs Abs: 1715 {cells}/uL (ref 850–3900)
MCH: 29.2 pg (ref 27.0–33.0)
MCHC: 32.8 g/dL (ref 32.0–36.0)
MCV: 89 fL (ref 80.0–100.0)
MPV: 10.3 fL (ref 7.5–12.5)
Monocytes Relative: 8.9 %
Neutro Abs: 2264 {cells}/uL (ref 1500–7800)
Neutrophils Relative %: 50.3 %
Platelets: 255 10*3/uL (ref 140–400)
RBC: 4.45 Million/uL (ref 3.80–5.10)
RDW: 13.2 % (ref 11.0–15.0)
Total Lymphocyte: 38.1 %
WBC: 4.5 10*3/uL (ref 3.8–10.8)

## 2020-04-02 LAB — LIPID PANEL
Cholesterol: 209 mg/dL — ABNORMAL HIGH
HDL: 57 mg/dL
LDL Cholesterol (Calc): 134 mg/dL — ABNORMAL HIGH
Non-HDL Cholesterol (Calc): 152 mg/dL — ABNORMAL HIGH
Total CHOL/HDL Ratio: 3.7 (calc)
Triglycerides: 79 mg/dL

## 2020-04-02 LAB — TSH: TSH: 0.99 m[IU]/L (ref 0.40–4.50)

## 2020-04-02 MED ORDER — CITALOPRAM HYDROBROMIDE 10 MG PO TABS: 10.0000 mg | ORAL_TABLET | Freq: Every day | ORAL | 2 refills | Status: DC

## 2020-04-02 NOTE — Progress Notes (Signed)
Subjective:    Patient ID: Yvonne Wu, female    DOB: January 10, 1956, 64 y.o.   MRN: [id number]  HPI  Patient is a 64 year old Caucasian female here today for complete physical exam.  She sees a gynecologist who performs her Pap smear.  She states that she had a mammogram in January of this year although we do not have records of that but that it is up-to-date.  She is not due for Pap smear today.  Her last colonoscopy was performed in 2020 and is well up today.  Her bone density test is also up-to-date.  She does have a history of osteopenia.  She is taking vitamin D.  She is not taking calcium.  She is due for a bone density again next year.  She denies any falls.  She denies any memory loss.  She does report anxiety.  She tried taking Lexapro but had headache with it.  She would like to try Celexa.  She states that she has tried Celexa in the past and did well on the medication.  At the present time she is having to take Klonopin every night to help calm down her mind so that she can sleep.  She is worried about becoming dependent on this medication. Immunization History  Administered Date(s) Administered  . Influenza,inj,Quad PF,6+ Mos 07/09/2017, 07/08/2018, 07/20/2019  . Tdap 10/02/2011    Past Medical History:  Diagnosis Date  . Anxiety   . Depression    situational  . Eczema   . Hypertension   . Hypothyroid   . Urinary, incontinence, stress female 02/15/2015   Past Surgical History:  Procedure Laterality Date  . BLADDER SUSPENSION N/A 02/15/2015   Procedure: TRANSVAGINAL TAPE (TVT) Mid-Urethral Sling PROCEDURE;  Surgeon: Azucena Fallen, MD;  Location: [facility];  Service: Gynecology;  Laterality: N/A;  2 hrs.  . BUNIONECTOMY Left   . CYSTOCELE REPAIR N/A 02/15/2015   Procedure: ANTERIOR REPAIR (CYSTOCELE)/Colporrhaphy;  Surgeon: Azucena Fallen, MD;  Location: [facility];  Service: Gynecology;  Laterality: N/A;  . CYSTOSCOPY N/A 02/15/2015   Procedure: CYSTOSCOPY;  Surgeon: Azucena Fallen,  MD;  Location: [facility];  Service: Gynecology;  Laterality: N/A;  . TUBAL LIGATION     Current Outpatient Medications on File Prior to Visit  Medication Sig Dispense Refill  . acetaminophen (TYLENOL) 325 MG tablet Take 650 mg by mouth every 6 (six) hours as needed for mild pain or headache. Reported on 04/14/2016    . celecoxib (CELEBREX) 200 MG capsule TAKE (1) CAPSULE BY MOUTH TWICE DAILY. 60 capsule 5  . clonazePAM (KLONOPIN) 0.5 MG tablet TAKE (1) TABLET BY MOUTH ONCE DAILY AS NEEDED FOR ANXIETY. 30 tablet 0  . fexofenadine (ALLEGRA) 180 MG tablet Take 180 mg by mouth daily. Reported on 04/14/2016    . fluticasone (FLONASE) 50 MCG/ACT nasal spray USE 2 SPRAYS IN EACH NOSTRIL ONCE DAILY. 16 g 5  . olmesartan (BENICAR) 20 MG tablet TAKE ONE TABLET BY MOUTH ONCE DAILY. 90 tablet 2  . SYNTHROID 88 MCG tablet TAKE ONE TABLET BY MOUTH ONCE DAILY. 30 tablet 1  . MINOCYCLINE HCL PO Take by mouth 2 (two) times daily. Pt unsure of dose, from Derm for skin (Patient not taking: Reported on 04/02/2020)     No current facility-administered medications on file prior to visit.   No Known Allergies Social History   Socioeconomic History  . Marital status: Widowed    Spouse name: Not on file  . Number of children: Not  on file  . Years of education: Not on file  . Highest education level: Not on file  Occupational History  . Not on file  Tobacco Use  . Smoking status: Former Smoker    Quit date: 02/22/1995    Years since quitting: 25.1  . Smokeless tobacco: Never Used  Substance and Sexual Activity  . Alcohol use: No  . Drug use: No  . Sexual activity: Not Currently  Other Topics Concern  . Not on file  Social History Narrative   Works at [employer].   Involves a lot of standing and bending.   Husband passed away in 03-05-13 with leukemia.   Patient states that her daughter is age 34 (as of 38) and has bipolar. This daughter lives with her.   This daughter has a child who is 32 years old and  another child he was 21 years old. They all live with the patient.   Patient also has a son who lives in [place].      Social Determinants of Health   Financial Resource Strain:   . Difficulty of Paying Living Expenses:   Food Insecurity:   . Worried About Charity fundraiser in the Last Year:   . Arboriculturist in the Last Year:   Transportation Needs:   . Film/video editor (Medical):   Marland Kitchen Lack of Transportation (Non-Medical):   Physical Activity:   . Days of Exercise per Week:   . Minutes of Exercise per Session:   Stress:   . Feeling of Stress :   Social Connections:   . Frequency of Communication with Friends and Family:   . Frequency of Social Gatherings with Friends and Family:   . Attends Religious Services:   . Active Member of Clubs or Organizations:   . Attends Archivist Meetings:   Marland Kitchen Marital Status:   Intimate Partner Violence:   . Fear of Current or Ex-Partner:   . Emotionally Abused:   Marland Kitchen Physically Abused:   . Sexually Abused:     Review of Systems  All other systems reviewed and are negative.      Objective:   Physical Exam Vitals reviewed.  Constitutional:      General: She is not in acute distress.    Appearance: Normal appearance. She is normal weight. She is not ill-appearing, toxic-appearing or diaphoretic.  Cardiovascular:     Rate and Rhythm: Normal rate and regular rhythm.     Pulses: Normal pulses.     Heart sounds: Normal heart sounds. No murmur heard.  No gallop.   Pulmonary:     Effort: Pulmonary effort is normal. No respiratory distress.     Breath sounds: Normal breath sounds. No stridor. No wheezing, rhonchi or rales.  Chest:     Chest wall: No tenderness.  Abdominal:     General: There is no distension.     Tenderness: There is no abdominal tenderness.  Musculoskeletal:     Right lower leg: No edema.     Left lower leg: No edema.  Neurological:     Mental Status: She is alert.           Assessment & Plan:   Pure hypercholesterolemia - Plan: CBC with Differential/Platelet, COMPLETE METABOLIC PANEL WITH GFR, Lipid panel  Other specified hypothyroidism - Plan: TSH  Essential hypertension  Osteopenia, unspecified location  Anxiety  General medical exam  Patient had a side effect from Lexapro.  However she would like to  try Celexa as she has tried this in the past.  We will start her on a low-dose of 10 mg a day and see how she does.  I will check a CMP and a fasting lipid panel to monitor her cholesterol.  I will check a CBC to monitor for any anemia or bone marrow issues.  I will check a TSH to ensure adequate dosage of her levothyroxine.  Blood pressure today is well controlled at 120/80.  Recommended 1200 mg a day of calcium and 1000 units a day of vitamin D.  Repeat bone density next year.  Mammogram is up-to-date.  Pap smear is up-to-date.  Colonoscopy is up-to-date.  Recommended the shingles vaccine.  She is already had her Covid vaccination.

## 2020-04-12 ENCOUNTER — Encounter: Payer: Self-pay | Admitting: *Deleted

## 2020-06-06 ENCOUNTER — Other Ambulatory Visit: Payer: Self-pay | Admitting: Family Medicine

## 2020-07-25 ENCOUNTER — Other Ambulatory Visit: Payer: Self-pay | Admitting: Family Medicine

## 2020-09-05 ENCOUNTER — Other Ambulatory Visit: Payer: Self-pay | Admitting: Family Medicine

## 2020-09-25 ENCOUNTER — Ambulatory Visit
Admission: EM | Admit: 2020-09-25 | Discharge: 2020-09-25 | Disposition: A | Discharge status: Home / Self Care | Payer: 59 | Attending: Internal Medicine | Admitting: Internal Medicine

## 2020-09-25 ENCOUNTER — Ambulatory Visit: Payer: Self-pay

## 2020-09-25 ENCOUNTER — Other Ambulatory Visit: Payer: Self-pay

## 2020-09-25 DIAGNOSIS — J3089 Other allergic rhinitis: Secondary | ICD-10-CM | POA: Diagnosis not present

## 2020-09-25 MED ORDER — PREDNISONE 20 MG PO TABS: 20.0000 mg | ORAL_TABLET | Freq: Every day | ORAL | 0 refills | Status: AC

## 2020-09-25 MED ORDER — METHYLPREDNISOLONE SODIUM SUCC 125 MG IJ SOLR
60.0000 mg | Freq: Once | INTRAMUSCULAR | Status: AC
  Administered 2020-09-25: 60 mg via INTRAMUSCULAR

## 2020-09-25 NOTE — ED Provider Notes (Signed)
[facility]    CSN: [id number] Arrival date & time: 09/25/20  1157      History   Chief Complaint Chief Complaint  Patient presents with  . Nasal Congestion    HPI Yvonne Wu is a 64 y.o. female comes to the urgent care with a 2-week history of nasal congestion, postnasal drainage and hoarseness of voice.  Patient symptoms started 2 weeks ago and has been persistent.  She has had cough which is minimally productive.  Patient has a history of allergies and she gets allergy shots periodically for her symptoms.  No fever or chills.  No loss of taste or smell.  No wheezing or chest tightness.Marland Kitchen   HPI  Past Medical History:  Diagnosis Date  . Anxiety   . Depression    situational  . Eczema   . Hypertension   . Hypothyroid   . Urinary, incontinence, stress female 02/15/2015    Patient Active Problem List   Diagnosis Date Noted  . Hepatitis C 07/14/2017  . Osteopenia 01/24/2016  . Cystocele 02/15/2015  . Urinary, incontinence, stress female 02/15/2015  . Low back pain 08/08/2013  . Anxiety   . Depression   . Hypertension   . Hypothyroid     Past Surgical History:  Procedure Laterality Date  . BLADDER SUSPENSION N/A 02/15/2015   Procedure: TRANSVAGINAL TAPE (TVT) Mid-Urethral Sling PROCEDURE;  Surgeon: Azucena Fallen, MD;  Location: [facility];  Service: Gynecology;  Laterality: N/A;  2 hrs.  . BUNIONECTOMY Left   . CYSTOCELE REPAIR N/A 02/15/2015   Procedure: ANTERIOR REPAIR (CYSTOCELE)/Colporrhaphy;  Surgeon: Azucena Fallen, MD;  Location: [facility];  Service: Gynecology;  Laterality: N/A;  . CYSTOSCOPY N/A 02/15/2015   Procedure: CYSTOSCOPY;  Surgeon: Azucena Fallen, MD;  Location: [facility];  Service: Gynecology;  Laterality: N/A;  . TUBAL LIGATION      OB History   No obstetric history on file.      Home Medications    Prior to Admission medications   Medication Sig Start Date End Date Taking? Authorizing Provider  acetaminophen (TYLENOL) 325 MG tablet  Take 650 mg by mouth every 6 (six) hours as needed for mild pain or headache. Reported on 04/14/2016    [provider]  celecoxib (CELEBREX) 200 MG capsule TAKE (1) CAPSULE BY MOUTH TWICE DAILY. 09/19/19   Susy Frizzle, MD  citalopram (CELEXA) 10 MG tablet Take 1 tablet (10 mg total) by mouth daily. 04/02/20   Susy Frizzle, MD  clonazePAM (KLONOPIN) 0.5 MG tablet TAKE (1) TABLET BY MOUTH ONCE DAILY AS NEEDED FOR ANXIETY. 01/31/20   Susy Frizzle, MD  fexofenadine (ALLEGRA) 180 MG tablet Take 180 mg by mouth daily. Reported on 04/14/2016    [provider]  fluticasone (FLONASE) 50 MCG/ACT nasal spray USE 2 SPRAYS IN EACH NOSTRIL ONCE DAILY. 11/25/16   Dena Billet B, PA-C  olmesartan (BENICAR) 20 MG tablet TAKE ONE TABLET BY MOUTH ONCE DAILY. 09/06/20   Susy Frizzle, MD  predniSONE (DELTASONE) 20 MG tablet Take 1 tablet (20 mg total) by mouth daily for 5 days. 09/25/20 09/30/20  , Myrene Galas, MD  SYNTHROID 88 MCG tablet TAKE ONE TABLET BY MOUTH ONCE DAILY. 06/06/20   Susy Frizzle, MD    Family History Family History  Problem Relation Age of Onset  . Asthma Mother   . Mental illness Mother   . Cancer Father 49       lung cancer  Social History Social History   Tobacco Use  . Smoking status: Former Smoker    Quit date: 02/22/1995    Years since quitting: 25.6  . Smokeless tobacco: Never Used  Substance Use Topics  . Alcohol use: No  . Drug use: No     Allergies   Patient has no known allergies.   Review of Systems Review of Systems  Constitutional: Negative.   Respiratory: Positive for cough. Negative for chest tightness, shortness of breath and wheezing.   Cardiovascular: Negative.   Gastrointestinal: Negative.      Physical Exam Triage Vital Signs ED Triage Vitals [09/25/20 1217]  Enc Vitals Group     BP (!) 150/94     Pulse Rate 63     Resp 20     Temp 98.3 F (36.8 C)     Temp src      SpO2 95 %     Weight      Height       Head Circumference      Peak Flow      Pain Score 0     Pain Loc      Pain Edu?      Excl. in [place]?    No data found.  Updated Vital Signs BP (!) 150/94   Pulse 63   Temp 98.3 F (36.8 C)   Resp 20   SpO2 95%   Visual Acuity Right Eye Distance:   Left Eye Distance:   Bilateral Distance:    Right Eye Near:   Left Eye Near:    Bilateral Near:     Physical Exam Vitals and nursing note reviewed.  Constitutional:      General: She is not in acute distress.    Appearance: She is not ill-appearing.  HENT:     Right Ear: Tympanic membrane normal.     Left Ear: Tympanic membrane normal.     Nose: No rhinorrhea.     Mouth/Throat:     Pharynx: No oropharyngeal exudate or posterior oropharyngeal erythema.  Cardiovascular:     Rate and Rhythm: Normal rate and regular rhythm.     Pulses: Normal pulses.     Heart sounds: Normal heart sounds.  Pulmonary:     Effort: Pulmonary effort is normal. No respiratory distress.     Breath sounds: Normal breath sounds. No rales.  Chest:     Chest wall: No tenderness.  Abdominal:     General: Bowel sounds are normal.     Palpations: Abdomen is soft.  Neurological:     Mental Status: She is alert.      UC Treatments / Results  Labs (all labs ordered are listed, but only abnormal results are displayed) Labs Reviewed - No data to display  EKG   Radiology No results found.  Procedures Procedures (including critical care time)  Medications Ordered in UC Medications  methylPREDNISolone sodium succinate (SOLU-MEDROL) 125 mg/2 mL injection 60 mg (60 mg Intramuscular Given 09/25/20 1351)    Initial Impression / Assessment and Plan / UC Course  I have reviewed the triage vital signs and the nursing notes.  Pertinent labs & imaging results that were available during my care of the patient were reviewed by me and considered in my medical decision making (see chart for details).     1.  Nonseasonal allergic  rhinitis: Solu-Medrol 60 mg IM x1 dose Prednisone 40 mg orally daily for 5 days Patient is advised to continue using her Flonase and  other medications for allergies.  Return precautions given.  No indication for x-rays of the chest  Final Clinical Impressions(s) / UC Diagnoses   Final diagnoses:  Non-seasonal allergic rhinitis due to other allergic trigger     Discharge Instructions     Please use saline nasal spray to ease nasal congestion Push oral fluid If you notice facial pain, purulent nasal drainage and/or fever-please return to urgent care to be re-evaluated   ED Prescriptions    Medication Sig Dispense Auth. Provider   predniSONE (DELTASONE) 20 MG tablet Take 1 tablet (20 mg total) by mouth daily for 5 days. 5 tablet , Myrene Galas, MD     PDMP not reviewed this encounter.   Chase Picket, MD 09/25/20 [phone]

## 2020-09-25 NOTE — ED Triage Notes (Signed)
Pt presents with c/o nasal drainage for past [redacted] weeks along with hoarseness

## 2020-09-25 NOTE — Discharge Instructions (Addendum)
Please use saline nasal spray to ease nasal congestion Push oral fluid If you notice facial pain, purulent nasal drainage and/or fever-please return to urgent care to be re-evaluated

## 2020-10-25 ENCOUNTER — Ambulatory Visit (INDEPENDENT_AMBULATORY_CARE_PROVIDER_SITE_OTHER): Payer: 59 | Admitting: Family Medicine

## 2020-10-25 ENCOUNTER — Encounter: Payer: Self-pay | Admitting: Family Medicine

## 2020-10-25 ENCOUNTER — Other Ambulatory Visit: Payer: Self-pay

## 2020-10-25 VITALS — BP 132/68 | HR 66 | Temp 97.9°F | Resp 16 | Ht 64.0 in | Wt 163.0 lb

## 2020-10-25 DIAGNOSIS — J329 Chronic sinusitis, unspecified: Secondary | ICD-10-CM

## 2020-10-25 DIAGNOSIS — J310 Chronic rhinitis: Secondary | ICD-10-CM | POA: Diagnosis not present

## 2020-10-25 MED ORDER — AMOXICILLIN 875 MG PO TABS: 875.0000 mg | ORAL_TABLET | Freq: Two times a day (BID) | ORAL | 0 refills | Status: DC

## 2020-10-25 NOTE — Progress Notes (Signed)
Subjective:    Patient ID: Yvonne Wu, female    DOB: Feb 07, 1956, 65 y.o.   MRN: [id number]  HPI  2 weeks ago, the patient developed "sinuses" and was treated with prednisone at a local urgent care.  Her symptoms improved for about a week.  4 days ago they returned.  Symptoms include head congestion, sinus pressure, postnasal drip, cough, headache.  She denies any shortness of breath or fever.  All told, her symptoms of been present now for about 3 weeks suggesting a secondary sinus infection versus a primary upper respiratory infection that resolved and is now a new onset upper respiratory infection Past Medical History:  Diagnosis Date  . Anxiety   . Depression    situational  . Eczema   . Hypertension   . Hypothyroid   . Urinary, incontinence, stress female 02/15/2015   Past Surgical History:  Procedure Laterality Date  . BLADDER SUSPENSION N/A 02/15/2015   Procedure: TRANSVAGINAL TAPE (TVT) Mid-Urethral Sling PROCEDURE;  Surgeon: Azucena Fallen, MD;  Location: [facility];  Service: Gynecology;  Laterality: N/A;  2 hrs.  . BUNIONECTOMY Left   . CYSTOCELE REPAIR N/A 02/15/2015   Procedure: ANTERIOR REPAIR (CYSTOCELE)/Colporrhaphy;  Surgeon: Azucena Fallen, MD;  Location: [facility];  Service: Gynecology;  Laterality: N/A;  . CYSTOSCOPY N/A 02/15/2015   Procedure: CYSTOSCOPY;  Surgeon: Azucena Fallen, MD;  Location: [facility];  Service: Gynecology;  Laterality: N/A;  . TUBAL LIGATION     Current Outpatient Medications on File Prior to Visit  Medication Sig Dispense Refill  . celecoxib (CELEBREX) 200 MG capsule TAKE (1) CAPSULE BY MOUTH TWICE DAILY. 60 capsule 5  . clonazePAM (KLONOPIN) 0.5 MG tablet TAKE (1) TABLET BY MOUTH ONCE DAILY AS NEEDED FOR ANXIETY. 30 tablet 0  . fexofenadine (ALLEGRA) 180 MG tablet Take 180 mg by mouth daily. Reported on 04/14/2016    . fluticasone (FLONASE) 50 MCG/ACT nasal spray USE 2 SPRAYS IN EACH NOSTRIL ONCE DAILY. 16 g 5  . guaiFENesin (MUCINEX) 600 MG 12 hr  tablet Take by mouth 2 (two) times daily.    Marland Kitchen olmesartan (BENICAR) 20 MG tablet TAKE ONE TABLET BY MOUTH ONCE DAILY. 30 tablet 3  . SYNTHROID 88 MCG tablet TAKE ONE TABLET BY MOUTH ONCE DAILY. 30 tablet 5   No current facility-administered medications on file prior to visit.   No Known Allergies Social History   Socioeconomic History  . Marital status: Widowed    Spouse name: Not on file  . Number of children: Not on file  . Years of education: Not on file  . Highest education level: Not on file  Occupational History  . Not on file  Tobacco Use  . Smoking status: Former Smoker    Quit date: 02/22/1995    Years since quitting: 25.6  . Smokeless tobacco: Never Used  Substance and Sexual Activity  . Alcohol use: No  . Drug use: No  . Sexual activity: Not Currently  Other Topics Concern  . Not on file  Social History Narrative   Works at [employer].   Involves a lot of standing and bending.   Husband passed away in 02-19-13 with leukemia.   Patient states that her daughter is age 49 (as of 81) and has bipolar. This daughter lives with her.   This daughter has a child who is 55 years old and another child he was 62 years old. They all live with the patient.   Patient also has a son  who lives in [place].      Social Determinants of Health   Financial Resource Strain: Not on file  Food Insecurity: Not on file  Transportation Needs: Not on file  Physical Activity: Not on file  Stress: Not on file  Social Connections: Not on file  Intimate Partner Violence: Not on file    Review of Systems  All other systems reviewed and are negative.      Objective:   Physical Exam Vitals reviewed.  Constitutional:      General: She is not in acute distress.    Appearance: Normal appearance. She is normal weight. She is not ill-appearing, toxic-appearing or diaphoretic.  HENT:     Right Ear: Tympanic membrane and ear canal normal.     Left Ear: Tympanic membrane and ear canal  normal.     Nose: Congestion and rhinorrhea present.     Right Turbinates: Enlarged and swollen.     Left Turbinates: Enlarged and swollen.     Right Sinus: Frontal sinus tenderness present.     Left Sinus: Frontal sinus tenderness present.     Mouth/Throat:     Pharynx: No oropharyngeal exudate or posterior oropharyngeal erythema.  Eyes:     Conjunctiva/sclera: Conjunctivae normal.  Cardiovascular:     Rate and Rhythm: Normal rate and regular rhythm.     Pulses: Normal pulses.     Heart sounds: Normal heart sounds. No murmur heard. No gallop.   Pulmonary:     Effort: Pulmonary effort is normal. No respiratory distress.     Breath sounds: Normal breath sounds. No stridor. No wheezing, rhonchi or rales.  Chest:     Chest wall: No tenderness.  Musculoskeletal:     Right lower leg: No edema.     Left lower leg: No edema.  Neurological:     Mental Status: She is alert.           Assessment & Plan:  Rhinosinusitis - Plan: SARS-COV-2 RNA,(COVID-19) QUAL NAAT  Either this is the exact same infection from 2 weeks ago that never improved and is turned into a secondary sinus infection or the patient has a new infection and given the current Covid pandemic likely represents Covid.  I recommended quarantining.  I performed a Covid test today.  I will treat the patient for possible secondary sinusitis with amoxicillin 875 mg twice daily for 10 days.  Await the results of her Covid test.

## 2020-10-26 ENCOUNTER — Ambulatory Visit: Payer: 59 | Admitting: Family Medicine

## 2020-10-28 LAB — SARS-COV-2 RNA,(COVID-19) QUALITATIVE NAAT: SARS CoV2 RNA: NOT DETECTED

## 2020-11-20 ENCOUNTER — Other Ambulatory Visit: Payer: Self-pay | Admitting: Family Medicine

## 2020-11-20 ENCOUNTER — Other Ambulatory Visit: Payer: Self-pay

## 2020-11-20 DIAGNOSIS — M545 Low back pain, unspecified: Secondary | ICD-10-CM

## 2020-11-20 MED ORDER — LEVOTHYROXINE SODIUM 88 MCG PO TABS
88.0000 ug | ORAL_TABLET | Freq: Every day | ORAL | 5 refills | Status: DC
Start: 2020-11-20 — End: 2021-07-05

## 2020-11-20 MED ORDER — CELECOXIB 200 MG PO CAPS: ORAL_CAPSULE | ORAL | 5 refills | Status: DC

## 2020-12-01 ENCOUNTER — Other Ambulatory Visit: Payer: Self-pay | Admitting: Family Medicine

## 2020-12-22 ENCOUNTER — Other Ambulatory Visit: Payer: Self-pay | Admitting: Family Medicine

## 2021-02-04 ENCOUNTER — Other Ambulatory Visit: Payer: Self-pay | Admitting: Obstetrics and Gynecology

## 2021-02-04 DIAGNOSIS — M8580 Other specified disorders of bone density and structure, unspecified site: Secondary | ICD-10-CM

## 2021-02-09 ENCOUNTER — Other Ambulatory Visit: Payer: Self-pay

## 2021-02-09 ENCOUNTER — Ambulatory Visit
Admission: RE | Admit: 2021-02-09 | Discharge: 2021-02-09 | Disposition: A | Discharge status: Home / Self Care | Payer: 59 | Source: Ambulatory Visit | Attending: Family Medicine | Admitting: Family Medicine

## 2021-02-09 VITALS — BP 156/100 | HR 94 | Temp 98.3°F | Resp 18 | Wt 173.0 lb

## 2021-02-09 DIAGNOSIS — J209 Acute bronchitis, unspecified: Secondary | ICD-10-CM

## 2021-02-09 MED ORDER — PREDNISONE 10 MG (21) PO TBPK: ORAL_TABLET | Freq: Every day | ORAL | 0 refills | Status: DC

## 2021-02-09 MED ORDER — DEXAMETHASONE SODIUM PHOSPHATE 10 MG/ML IJ SOLN
10.0000 mg | Freq: Once | INTRAMUSCULAR | Status: AC
  Administered 2021-02-09: 10 mg via INTRAMUSCULAR

## 2021-02-09 NOTE — ED Triage Notes (Signed)
For about 5 days pt has had congestion, runny nose, sore throat.

## 2021-02-09 NOTE — Discharge Instructions (Addendum)
You have received a steroid injection in the office today  I have sent in a prednisone taper for you to take for 6 days. 6 tablets on day one, 5 tablets on day two, 4 tablets on day three, 3 tablets on day four, 2 tablets on day five, and 1 tablet on day six.  Follow up with this office or with primary care if symptoms are persisting.  Follow up in the ER for high fever, trouble swallowing, trouble breathing, other concerning symptoms.

## 2021-02-12 NOTE — ED Provider Notes (Signed)
[facility]    CSN: [id number] Arrival date & time: 02/09/21  1144      History   Chief Complaint Chief Complaint  Patient presents with  . Nasal Congestion    HPI Yvonne Wu is a 65 y.o. female.   Reports that she has been having cough, nasal congestion, runny nose and a sore throat for the last 5 days.  Has taken over-the-counter cough and cold with little relief.  Denies sick contacts.  Has positive history of COVID.  Has had COVID vaccines and booster.  Has had flu vaccine this year.  Denies headache, abdominal pain, nausea, vomiting, diarrhea, rash, fever, other symptoms.  ROS per HPI  The history is provided by the patient.    Past Medical History:  Diagnosis Date  . Anxiety   . Depression    situational  . Eczema   . Hypertension   . Hypothyroid   . Urinary, incontinence, stress female 02/15/2015    Patient Active Problem List   Diagnosis Date Noted  . Hepatitis C 07/14/2017  . Osteopenia 01/24/2016  . Cystocele 02/15/2015  . Urinary, incontinence, stress female 02/15/2015  . Low back pain 08/08/2013  . Anxiety   . Depression   . Hypertension   . Hypothyroid     Past Surgical History:  Procedure Laterality Date  . BLADDER SUSPENSION N/A 02/15/2015   Procedure: TRANSVAGINAL TAPE (TVT) Mid-Urethral Sling PROCEDURE;  Surgeon: Azucena Fallen, MD;  Location: [facility];  Service: Gynecology;  Laterality: N/A;  2 hrs.  . BUNIONECTOMY Left   . CYSTOCELE REPAIR N/A 02/15/2015   Procedure: ANTERIOR REPAIR (CYSTOCELE)/Colporrhaphy;  Surgeon: Azucena Fallen, MD;  Location: [facility];  Service: Gynecology;  Laterality: N/A;  . CYSTOSCOPY N/A 02/15/2015   Procedure: CYSTOSCOPY;  Surgeon: Azucena Fallen, MD;  Location: [facility];  Service: Gynecology;  Laterality: N/A;  . TUBAL LIGATION      OB History   No obstetric history on file.      Home Medications    Prior to Admission medications   Medication Sig Start Date End Date Taking? Authorizing Provider   predniSONE (STERAPRED UNI-PAK 21 TAB) 10 MG (21) TBPK tablet Take by mouth daily for 6 days. Take 6 tablets on day 1, 5 tablets on day 2, 4 tablets on day 3, 3 tablets on day 4, 2 tablets on day 5, 1 tablet on day 6 02/09/21 02/15/21 Yes Faustino Congress, NP  amoxicillin (AMOXIL) 875 MG tablet Take 1 tablet (875 mg total) by mouth 2 (two) times daily. 10/25/20   Susy Frizzle, MD  celecoxib (CELEBREX) 200 MG capsule TAKE (1) CAPSULE BY MOUTH TWICE DAILY. 11/20/20   Susy Frizzle, MD  clonazePAM (KLONOPIN) 0.5 MG tablet TAKE (1) TABLET BY MOUTH ONCE DAILY AS NEEDED FOR ANXIETY. 12/04/20   Susy Frizzle, MD  fexofenadine (ALLEGRA) 180 MG tablet Take 180 mg by mouth daily. Reported on 04/14/2016    [provider]  fluticasone (FLONASE) 50 MCG/ACT nasal spray USE 2 SPRAYS IN EACH NOSTRIL ONCE DAILY. 11/25/16   Orlena Sheldon, PA-C  guaiFENesin (MUCINEX) 600 MG 12 hr tablet Take by mouth 2 (two) times daily.    [provider]  levothyroxine (SYNTHROID) 88 MCG tablet Take 1 tablet (88 mcg total) by mouth daily. 11/20/20   Susy Frizzle, MD  olmesartan (BENICAR) 20 MG tablet TAKE ONE TABLET BY MOUTH ONCE DAILY. 12/24/20   Susy Frizzle, MD    Family History Family History  Problem Relation Age of Onset  . Asthma Mother   . Mental illness Mother   . Cancer Father 27       lung cancer    Social History Social History   Tobacco Use  . Smoking status: Former Smoker    Quit date: 02/22/1995    Years since quitting: 25.9  . Smokeless tobacco: Never Used  Substance Use Topics  . Alcohol use: No  . Drug use: No     Allergies   Patient has no known allergies.   Review of Systems Review of Systems   Physical Exam Triage Vital Signs ED Triage Vitals  Enc Vitals Group     BP 02/09/21 1151 (!) 156/100     Pulse Rate 02/09/21 1151 94     Resp 02/09/21 1151 18     Temp 02/09/21 1151 98.3 F (36.8 C)     Temp Source 02/09/21 1151 Oral     SpO2 02/09/21 1151 97  %     Weight 02/09/21 1155 173 lb (78.5 kg)     Height --      Head Circumference --      Peak Flow --      Pain Score 02/09/21 1154 0     Pain Loc --      Pain Edu? --      Excl. in [place]? --    No data found.  Updated Vital Signs BP (!) 156/100   Pulse 94   Temp 98.3 F (36.8 C) (Oral)   Resp 18   Wt 173 lb (78.5 kg)   SpO2 97%   BMI 29.70 kg/m   Visual Acuity Right Eye Distance:   Left Eye Distance:   Bilateral Distance:    Right Eye Near:   Left Eye Near:    Bilateral Near:     Physical Exam Vitals and nursing note reviewed.  Constitutional:      General: She is not in acute distress.    Appearance: Normal appearance. She is well-developed.  HENT:     Head: Normocephalic and atraumatic.     Right Ear: Tympanic membrane, ear canal and external ear normal.     Left Ear: Tympanic membrane, ear canal and external ear normal.     Nose: Congestion present.     Mouth/Throat:     Mouth: Mucous membranes are moist.     Pharynx: Posterior oropharyngeal erythema present.  Eyes:     Extraocular Movements: Extraocular movements intact.     Conjunctiva/sclera: Conjunctivae normal.     Pupils: Pupils are equal, round, and reactive to light.  Cardiovascular:     Rate and Rhythm: Normal rate and regular rhythm.     Heart sounds: Normal heart sounds. No murmur heard.   Pulmonary:     Effort: Pulmonary effort is normal. No respiratory distress.     Breath sounds: No stridor. Wheezing present. No rhonchi or rales.  Chest:     Chest wall: No tenderness.  Abdominal:     Palpations: Abdomen is soft.     Tenderness: There is no abdominal tenderness.  Musculoskeletal:        General: Normal range of motion.     Cervical back: Normal range of motion and neck supple.  Skin:    General: Skin is warm and dry.     Capillary Refill: Capillary refill takes less than 2 seconds.  Neurological:     General: No focal deficit present.     Mental Status: She  is alert and oriented to  person, place, and time.  Psychiatric:        Mood and Affect: Mood normal.        Behavior: Behavior normal.        Thought Content: Thought content normal.      UC Treatments / Results  Labs (all labs ordered are listed, but only abnormal results are displayed) Labs Reviewed - No data to display  EKG   Radiology No results found.  Procedures Procedures (including critical care time)  Medications Ordered in UC Medications  dexamethasone (DECADRON) injection 10 mg (10 mg Intramuscular Given 02/09/21 1218)    Initial Impression / Assessment and Plan / UC Course  I have reviewed the triage vital signs and the nursing notes.  Pertinent labs & imaging results that were available during my care of the patient were reviewed by me and considered in my medical decision making (see chart for details).    Acute bronchitis  Decadron 10 mg IM given in office today Prednisone taper prescribed Continue with symptomatic treatment with Zyrtec and Flonase as needed Follow up with this office or with primary care if symptoms are persisting.  Follow up in the ER for high fever, trouble swallowing, trouble breathing, other concerning symptoms.   Final Clinical Impressions(s) / UC Diagnoses   Final diagnoses:  Acute bronchitis, unspecified organism     Discharge Instructions     You have received a steroid injection in the office today  I have sent in a prednisone taper for you to take for 6 days. 6 tablets on day one, 5 tablets on day two, 4 tablets on day three, 3 tablets on day four, 2 tablets on day five, and 1 tablet on day six.  Follow up with this office or with primary care if symptoms are persisting.  Follow up in the ER for high fever, trouble swallowing, trouble breathing, other concerning symptoms.     ED Prescriptions    Medication Sig Dispense Auth. Provider   predniSONE (STERAPRED UNI-PAK 21 TAB) 10 MG (21) TBPK tablet Take by mouth daily for 6 days. Take 6  tablets on day 1, 5 tablets on day 2, 4 tablets on day 3, 3 tablets on day 4, 2 tablets on day 5, 1 tablet on day 6 21 tablet Faustino Congress, NP     PDMP not reviewed this encounter.   Faustino Congress, NP 02/12/21 1330

## 2021-02-14 ENCOUNTER — Encounter: Payer: Self-pay | Admitting: Family Medicine

## 2021-02-14 ENCOUNTER — Other Ambulatory Visit: Payer: Self-pay

## 2021-02-14 ENCOUNTER — Ambulatory Visit: Payer: 59 | Admitting: Family Medicine

## 2021-02-14 VITALS — BP 142/80 | HR 84 | Temp 98.7°F | Resp 14 | Ht 64.0 in | Wt 176.0 lb

## 2021-02-14 DIAGNOSIS — J029 Acute pharyngitis, unspecified: Secondary | ICD-10-CM

## 2021-02-14 MED ORDER — MONTELUKAST SODIUM 10 MG PO TABS: 10.0000 mg | ORAL_TABLET | Freq: Every day | ORAL | 3 refills | Status: DC

## 2021-02-14 MED ORDER — CLONAZEPAM 0.5 MG PO TABS: 0.5000 mg | ORAL_TABLET | Freq: Two times a day (BID) | ORAL | 0 refills | Status: DC | PRN

## 2021-02-14 NOTE — Progress Notes (Signed)
Subjective:    Patient ID: Yvonne Wu, female    DOB: Dec 06, 1955, 65 y.o.   MRN: [id number]  HPI  Patient was treated in urgent care over the weekend with a steroid shot and prednisone taper for allergies.  She reports a sore throat.  She reports head congestion.  She reports rhinorrhea.  She reports drainage.  She has severe allergies and she is already taking Allegra and Flonase.  However her granddaughter just tested positive for strep throat and she would like a strep test that she does have a sore throat. Past Medical History:  Diagnosis Date  . Anxiety   . Depression    situational  . Eczema   . Hypertension   . Hypothyroid   . Urinary, incontinence, stress female 02/15/2015   Past Surgical History:  Procedure Laterality Date  . BLADDER SUSPENSION N/A 02/15/2015   Procedure: TRANSVAGINAL TAPE (TVT) Mid-Urethral Sling PROCEDURE;  Surgeon: Azucena Fallen, MD;  Location: [facility];  Service: Gynecology;  Laterality: N/A;  2 hrs.  . BUNIONECTOMY Left   . CYSTOCELE REPAIR N/A 02/15/2015   Procedure: ANTERIOR REPAIR (CYSTOCELE)/Colporrhaphy;  Surgeon: Azucena Fallen, MD;  Location: [facility];  Service: Gynecology;  Laterality: N/A;  . CYSTOSCOPY N/A 02/15/2015   Procedure: CYSTOSCOPY;  Surgeon: Azucena Fallen, MD;  Location: [facility];  Service: Gynecology;  Laterality: N/A;  . TUBAL LIGATION     Current Outpatient Medications on File Prior to Visit  Medication Sig Dispense Refill  . celecoxib (CELEBREX) 200 MG capsule TAKE (1) CAPSULE BY MOUTH TWICE DAILY. 60 capsule 5  . fexofenadine (ALLEGRA) 180 MG tablet Take 180 mg by mouth daily. Reported on 04/14/2016    . fluticasone (FLONASE) 50 MCG/ACT nasal spray USE 2 SPRAYS IN EACH NOSTRIL ONCE DAILY. 16 g 5  . levothyroxine (SYNTHROID) 88 MCG tablet Take 1 tablet (88 mcg total) by mouth daily. 30 tablet 5  . olmesartan (BENICAR) 20 MG tablet TAKE ONE TABLET BY MOUTH ONCE DAILY. 30 tablet 0   No current facility-administered medications on file  prior to visit.   No Known Allergies Social History   Socioeconomic History  . Marital status: Widowed    Spouse name: Not on file  . Number of children: Not on file  . Years of education: Not on file  . Highest education level: Not on file  Occupational History  . Not on file  Tobacco Use  . Smoking status: Former Smoker    Quit date: 02/22/1995    Years since quitting: 25.9  . Smokeless tobacco: Never Used  Substance and Sexual Activity  . Alcohol use: No  . Drug use: No  . Sexual activity: Not Currently  Other Topics Concern  . Not on file  Social History Narrative   Works at [employer].   Involves a lot of standing and bending.   Husband passed away in 2013-02-11 with leukemia.   Patient states that her daughter is age 65 (as of 56) and has bipolar. This daughter lives with her.   This daughter has a child who is 47 years old and another child he was 16 years old. They all live with the patient.   Patient also has a son who lives in [place].      Social Determinants of Health   Financial Resource Strain: Not on file  Food Insecurity: Not on file  Transportation Needs: Not on file  Physical Activity: Not on file  Stress: Not on file  Social Connections: Not  on file  Intimate Partner Violence: Not on file    Review of Systems  All other systems reviewed and are negative.      Objective:   Physical Exam Vitals reviewed.  Constitutional:      General: She is not in acute distress.    Appearance: Normal appearance. She is normal weight. She is not ill-appearing, toxic-appearing or diaphoretic.  HENT:     Right Ear: Tympanic membrane and ear canal normal.     Left Ear: Tympanic membrane and ear canal normal.     Nose: Congestion and rhinorrhea present.     Right Turbinates: Enlarged and swollen.     Left Turbinates: Enlarged and swollen.     Mouth/Throat:     Pharynx: Posterior oropharyngeal erythema present. No oropharyngeal exudate.  Eyes:      Conjunctiva/sclera: Conjunctivae normal.  Cardiovascular:     Rate and Rhythm: Normal rate and regular rhythm.     Pulses: Normal pulses.     Heart sounds: Normal heart sounds. No murmur heard. No gallop.   Pulmonary:     Effort: Pulmonary effort is normal. No respiratory distress.     Breath sounds: Normal breath sounds. No stridor. No wheezing, rhonchi or rales.  Chest:     Chest wall: No tenderness.  Musculoskeletal:     Right lower leg: No edema.     Left lower leg: No edema.  Neurological:     Mental Status: She is alert.           Assessment & Plan:  Sore throat - Plan: STREP GROUP A AG, W/REFLEX TO CULT  I believe the patient is dealing with seasonal allergies.  Her strep test was negative.  Add Singulair 10 mg a day to her Flonase and Allegra.  Her exam today is significant primarily for rhinorrhea and postnasal drip.

## 2021-02-16 LAB — CULTURE, GROUP A STREP
MICRO NUMBER:: 11826045
SPECIMEN QUALITY:: ADEQUATE

## 2021-02-16 LAB — STREP GROUP A AG, W/REFLEX TO CULT: Streptococcus Group A AG: NOT DETECTED

## 2021-02-28 ENCOUNTER — Other Ambulatory Visit: Payer: Self-pay | Admitting: Family Medicine

## 2021-04-01 ENCOUNTER — Other Ambulatory Visit: Payer: Self-pay | Admitting: Family Medicine

## 2021-04-01 NOTE — Telephone Encounter (Signed)
Ok to refill??  Last office visit/  refill 02/14/2021.

## 2021-07-05 ENCOUNTER — Other Ambulatory Visit: Payer: Self-pay | Admitting: Family Medicine

## 2021-08-07 ENCOUNTER — Other Ambulatory Visit: Payer: Self-pay | Admitting: Family Medicine

## 2021-08-16 ENCOUNTER — Other Ambulatory Visit: Payer: 59

## 2021-09-07 ENCOUNTER — Other Ambulatory Visit: Payer: Self-pay | Admitting: Family Medicine

## 2021-10-10 ENCOUNTER — Other Ambulatory Visit: Payer: Self-pay | Admitting: Family Medicine

## 2021-10-22 ENCOUNTER — Ambulatory Visit: Payer: 59 | Admitting: Family Medicine

## 2021-10-24 ENCOUNTER — Ambulatory Visit (INDEPENDENT_AMBULATORY_CARE_PROVIDER_SITE_OTHER): Payer: Medicare Other | Admitting: Family Medicine

## 2021-10-24 ENCOUNTER — Encounter: Payer: Self-pay | Admitting: Family Medicine

## 2021-10-24 ENCOUNTER — Other Ambulatory Visit: Payer: Self-pay

## 2021-10-24 VITALS — BP 132/88 | HR 86 | Ht 63.5 in | Wt 177.0 lb

## 2021-10-24 DIAGNOSIS — E039 Hypothyroidism, unspecified: Secondary | ICD-10-CM

## 2021-10-24 DIAGNOSIS — E7800 Pure hypercholesterolemia, unspecified: Secondary | ICD-10-CM

## 2021-10-24 NOTE — Progress Notes (Signed)
Subjective:    Patient ID: Yvonne Wu, female    DOB: 03-18-1956, 66 y.o.   MRN: [id number]  HPI Patient is a very pleasant 66 year old Caucasian female here today for a checkup.  She has history of hypothyroidism and is currently on levothyroxine.  She is overdue to recheck a TSH.  She also has a history of hyperlipidemia and has not had her cholesterol checked in over a year.  She denies any chest pain shortness of breath or dyspnea on exertion.  She is not on any statin therapy.  Her blood pressure today is excellent.  Her mammogram is up-to-date.  She had a bone density test in 2020 which showed borderline osteoporosis T score of -2.4.  She is overdue to recheck her bone density test Past Medical History:  Diagnosis Date   Anxiety    Depression    situational   Eczema    Hypertension    Hypothyroid    Urinary, incontinence, stress female 02/15/2015   Past Surgical History:  Procedure Laterality Date   BLADDER SUSPENSION N/A 02/15/2015   Procedure: TRANSVAGINAL TAPE (TVT) Mid-Urethral Sling PROCEDURE;  Surgeon: Azucena Fallen, MD;  Location: [facility];  Service: Gynecology;  Laterality: N/A;  2 hrs.   BUNIONECTOMY Left    CYSTOCELE REPAIR N/A 02/15/2015   Procedure: ANTERIOR REPAIR (CYSTOCELE)/Colporrhaphy;  Surgeon: Azucena Fallen, MD;  Location: [facility];  Service: Gynecology;  Laterality: N/A;   CYSTOSCOPY N/A 02/15/2015   Procedure: CYSTOSCOPY;  Surgeon: Azucena Fallen, MD;  Location: [facility];  Service: Gynecology;  Laterality: N/A;   TUBAL LIGATION     Current Outpatient Medications on File Prior to Visit  Medication Sig Dispense Refill   celecoxib (CELEBREX) 200 MG capsule TAKE (1) CAPSULE BY MOUTH TWICE DAILY. 60 capsule 5   clonazePAM (KLONOPIN) 0.5 MG tablet TAKE (1/2) TABLET BY MOUTH TWICE DAILY AS NEEDED FOR ANXIETY. 30 tablet 0   fexofenadine (ALLEGRA) 180 MG tablet Take 180 mg by mouth daily. Reported on 04/14/2016     fluticasone (FLONASE) 50 MCG/ACT nasal spray USE 2 SPRAYS  IN EACH NOSTRIL ONCE DAILY. 16 g 5   montelukast (SINGULAIR) 10 MG tablet Take 1 tablet (10 mg total) by mouth at bedtime. 30 tablet 3   olmesartan (BENICAR) 20 MG tablet TAKE ONE TABLET BY MOUTH ONCE DAILY. 30 tablet 0   SYNTHROID 88 MCG tablet TAKE ONE TABLET BY MOUTH ONCE DAILY. 30 tablet 0   No current facility-administered medications on file prior to visit.   No Known Allergies Social History   Socioeconomic History   Marital status: Widowed    Spouse name: Not on file   Number of children: Not on file   Years of education: Not on file   Highest education level: Not on file  Occupational History   Not on file  Tobacco Use   Smoking status: Former    Types: Cigarettes    Quit date: 02/22/1995    Years since quitting: 26.6   Smokeless tobacco: Never  Substance and Sexual Activity   Alcohol use: No   Drug use: No   Sexual activity: Not Currently  Other Topics Concern   Not on file  Social History Narrative   Works at [employer].   Involves a lot of standing and bending.   Husband passed away in 02/14/2013 with leukemia.   Patient states that her daughter is age 66 (as of 68) and has bipolar. This daughter lives with her.   This daughter  has a child who is 58 years old and another child he was 30 years old. They all live with the patient.   Patient also has a son who lives in [place].      Social Determinants of Health   Financial Resource Strain: Not on file  Food Insecurity: Not on file  Transportation Needs: Not on file  Physical Activity: Not on file  Stress: Not on file  Social Connections: Not on file  Intimate Partner Violence: Not on file    Review of Systems  All other systems reviewed and are negative.     Objective:   Physical Exam Vitals reviewed.  Constitutional:      General: She is not in acute distress.    Appearance: Normal appearance. She is normal weight. She is not ill-appearing, toxic-appearing or diaphoretic.  Cardiovascular:     Rate  and Rhythm: Normal rate and regular rhythm.     Pulses: Normal pulses.     Heart sounds: Normal heart sounds. No murmur heard.   No gallop.  Pulmonary:     Effort: Pulmonary effort is normal. No respiratory distress.     Breath sounds: Normal breath sounds. No stridor. No wheezing, rhonchi or rales.  Chest:     Chest wall: No tenderness.  Abdominal:     General: Bowel sounds are normal. There is no distension.     Palpations: Abdomen is soft. There is no mass.     Tenderness: There is no abdominal tenderness. There is no guarding or rebound.  Musculoskeletal:     Right lower leg: No edema.     Left lower leg: No edema.  Neurological:     Mental Status: She is alert.          Assessment & Plan:  Hypothyroidism, unspecified type - Plan: CBC with Differential/Platelet, Lipid panel, COMPLETE METABOLIC PANEL WITH GFR, TSH  Pure hypercholesterolemia - Plan: CBC with Differential/Platelet, Lipid panel, COMPLETE METABOLIC PANEL WITH GFR  Monitor management of her hypothyroidism and checking a TSH.  Continue levothyroxine uptitrate levothyroxine TSH is outside of therapeutic range.  Check a fasting lipid panel.  Goal LDL cholesterol is less than 100.  Would recommend statin therapy if LDL cholesterol greater than 100.  Mammogram is up-to-date.  Encouraged the patient to get a bone density test we also discussed starting Fosamax if her T score is > -2.5.  Patient prefers to schedule a bone density for herself through her gynecologist

## 2021-10-25 LAB — CBC WITH DIFFERENTIAL/PLATELET
Absolute Monocytes: 455 cells/uL (ref 200–950)
Basophils Absolute: 30 cells/uL (ref 0–200)
Basophils Relative: 0.6 %
Eosinophils Absolute: 120 cells/uL (ref 15–500)
Eosinophils Relative: 2.4 %
HCT: 41.9 % (ref 35.0–45.0)
Hemoglobin: 14.1 g/dL (ref 11.7–15.5)
Lymphs Abs: 2005 cells/uL (ref 850–3900)
MCH: 30.5 pg (ref 27.0–33.0)
MCHC: 33.7 g/dL (ref 32.0–36.0)
MCV: 90.7 fL (ref 80.0–100.0)
MPV: 10 fL (ref 7.5–12.5)
Monocytes Relative: 9.1 %
Neutro Abs: 2390 cells/uL (ref 1500–7800)
Neutrophils Relative %: 47.8 %
Platelets: 296 10*3/uL (ref 140–400)
RBC: 4.62 10*6/uL (ref 3.80–5.10)
RDW: 12.7 % (ref 11.0–15.0)
Total Lymphocyte: 40.1 %
WBC: 5 10*3/uL (ref 3.8–10.8)

## 2021-10-25 LAB — COMPLETE METABOLIC PANEL WITH GFR
AG Ratio: 1.9 (calc) (ref 1.0–2.5)
ALT: 19 U/L (ref 6–29)
AST: 18 U/L (ref 10–35)
Albumin: 4.6 g/dL (ref 3.6–5.1)
Alkaline phosphatase (APISO): 103 U/L (ref 37–153)
BUN: 12 mg/dL (ref 7–25)
CO2: 27 mmol/L (ref 20–32)
Calcium: 9.6 mg/dL (ref 8.6–10.4)
Chloride: 105 mmol/L (ref 98–110)
Creat: 0.7 mg/dL (ref 0.50–1.05)
Globulin: 2.4 g/dL (calc) (ref 1.9–3.7)
Glucose, Bld: 82 mg/dL (ref 65–99)
Potassium: 4.7 mmol/L (ref 3.5–5.3)
Sodium: 139 mmol/L (ref 135–146)
Total Bilirubin: 0.5 mg/dL (ref 0.2–1.2)
Total Protein: 7 g/dL (ref 6.1–8.1)
eGFR: 96 mL/min/{1.73_m2} (ref >=60)

## 2021-10-25 LAB — LIPID PANEL
Cholesterol: 220 mg/dL — ABNORMAL HIGH (ref <200)
HDL: 59 mg/dL (ref >=50)
LDL Cholesterol (Calc): 133 mg/dL (calc) — ABNORMAL HIGH
Non-HDL Cholesterol (Calc): 161 mg/dL (calc) — ABNORMAL HIGH (ref <130)
Total CHOL/HDL Ratio: 3.7 (calc) (ref <5.0)
Triglycerides: 146 mg/dL (ref <150)

## 2021-10-25 LAB — TSH: TSH: 1.06 mIU/L (ref 0.40–4.50)

## 2021-10-28 ENCOUNTER — Other Ambulatory Visit: Payer: Self-pay | Admitting: Obstetrics and Gynecology

## 2021-10-28 DIAGNOSIS — M8580 Other specified disorders of bone density and structure, unspecified site: Secondary | ICD-10-CM

## 2021-10-30 ENCOUNTER — Other Ambulatory Visit: Payer: Self-pay

## 2021-10-30 ENCOUNTER — Ambulatory Visit (INDEPENDENT_AMBULATORY_CARE_PROVIDER_SITE_OTHER): Payer: Medicare Other | Admitting: Physician Assistant

## 2021-10-30 ENCOUNTER — Encounter: Payer: Self-pay | Admitting: Physician Assistant

## 2021-10-30 DIAGNOSIS — Z1283 Encounter for screening for malignant neoplasm of skin: Secondary | ICD-10-CM | POA: Diagnosis not present

## 2021-10-30 DIAGNOSIS — L820 Inflamed seborrheic keratosis: Secondary | ICD-10-CM

## 2021-10-30 DIAGNOSIS — L918 Other hypertrophic disorders of the skin: Secondary | ICD-10-CM | POA: Diagnosis not present

## 2021-10-30 DIAGNOSIS — L570 Actinic keratosis: Secondary | ICD-10-CM

## 2021-10-30 DIAGNOSIS — D485 Neoplasm of uncertain behavior of skin: Secondary | ICD-10-CM

## 2021-10-30 NOTE — Patient Instructions (Signed)

## 2021-10-31 ENCOUNTER — Encounter: Payer: Self-pay | Admitting: Physician Assistant

## 2021-10-31 NOTE — Progress Notes (Signed)
° °  New Patient   Subjective  Yvonne Wu is a 66 y.o. female who presents for the following: New Patient (Initial Visit) (Patient here today for lesion on upper eyelid x 6 months no bleeding, no pain. Patient also states that she has a lesion on her left inner cheek that was noticed by her hair stylist. Patient also has a lesion on her right wrist  x 6 months no bleeding, no pain. Patient also has a lesion on her chest x 1 year no bleeding. No personal history or family history of atypical moles, melanoma or non mole skin cancer. ).   The following portions of the chart were reviewed this encounter and updated as appropriate:  Tobacco   Allergies   Meds   Problems   Med Hx   Surg Hx   Fam Hx       Objective  Well appearing patient in no apparent distress; mood and affect are within normal limits.  A full examination was performed including scalp, head, eyes, ears, nose, lips, neck, chest, axillae, abdomen, back, buttocks, bilateral upper extremities, bilateral lower extremities, hands, feet, fingers, toes, fingernails, and toenails. All findings within normal limits unless otherwise noted below.  Left Upper Eyelid On the papuple border   Chest - Medial (Center) Pink plaque with brown borders.     Right Thigh - Anterior Dark crust     Left Buccal Cheek Erythematous patches with gritty scale.   Assessment & Plan  Skin tag Left Upper Eyelid  Pt is to go to Ophthalmologist  if it gets bigger  Neoplasm of uncertain behavior of skin (2) Chest - Medial (Center)  Skin / nail biopsy Type of biopsy: tangential   Informed consent: discussed and consent obtained   Timeout: patient name, date of birth, surgical site, and procedure verified   Anesthesia: the lesion was anesthetized in a standard fashion   Anesthetic:  1% lidocaine w/ epinephrine 1-100,000 local infiltration Instrument used: flexible razor blade   Hemostasis achieved with: ferric subsulfate   Outcome: patient  tolerated procedure well   Post-procedure details: wound care instructions given    Specimen 1 - Surgical pathology Differential Diagnosis: R/O BCC VS SCC  Check Margins: No  Right Thigh - Anterior  Skin / nail biopsy Type of biopsy: tangential   Informed consent: discussed and consent obtained   Timeout: patient name, date of birth, surgical site, and procedure verified   Anesthesia: the lesion was anesthetized in a standard fashion   Anesthetic:  1% lidocaine w/ epinephrine 1-100,000 local infiltration Instrument used: flexible razor blade   Hemostasis achieved with: ferric subsulfate   Outcome: patient tolerated procedure well   Post-procedure details: wound care instructions given    Specimen 2 - Surgical pathology Differential Diagnosis: R/O BCC VS SCC  Check Margins: No  Actinic keratosis Left Buccal Cheek  Destruction of lesion - Left Buccal Cheek Complexity: simple   Destruction method: cryotherapy   Informed consent: discussed and consent obtained   Lesion destroyed using liquid nitrogen: Yes   Cryotherapy cycles:  3 Outcome: patient tolerated procedure well with no complications   Post-procedure details: wound care instructions given    No atypical nevi noted at the time of the visit.   I, ,, PA-C, have reviewed all documentation's for this visit.  The documentation on 10/31/21 for the exam, diagnosis, procedures and orders are all accurate and complete.

## 2021-11-14 ENCOUNTER — Other Ambulatory Visit: Payer: Self-pay | Admitting: Family Medicine

## 2021-11-14 NOTE — Telephone Encounter (Signed)
Clonazepam refill request.  Last seen 10/24/2021, last filled 04/01/2021.

## 2021-12-17 ENCOUNTER — Other Ambulatory Visit: Payer: Self-pay | Admitting: Family Medicine

## 2021-12-17 DIAGNOSIS — M545 Low back pain, unspecified: Secondary | ICD-10-CM

## 2022-02-06 ENCOUNTER — Other Ambulatory Visit: Payer: Self-pay | Admitting: Physician Assistant

## 2022-03-12 ENCOUNTER — Telehealth: Payer: Self-pay | Admitting: *Deleted

## 2022-03-12 ENCOUNTER — Ambulatory Visit (INDEPENDENT_AMBULATORY_CARE_PROVIDER_SITE_OTHER): Payer: Medicare Other | Admitting: Physician Assistant

## 2022-03-12 DIAGNOSIS — L309 Dermatitis, unspecified: Secondary | ICD-10-CM

## 2022-03-12 MED ORDER — PIMECROLIMUS 1 % EX CREA: TOPICAL_CREAM | Freq: Two times a day (BID) | CUTANEOUS | 10 refills | Status: DC

## 2022-03-12 NOTE — Telephone Encounter (Signed)
Prior authorization done via cover my meds for her Elidel cream.

## 2022-03-19 ENCOUNTER — Ambulatory Visit
Admission: RE | Admit: 2022-03-19 | Discharge: 2022-03-19 | Disposition: A | Discharge status: Home / Self Care | Payer: Medicare Other | Source: Ambulatory Visit | Attending: Obstetrics and Gynecology | Admitting: Obstetrics and Gynecology

## 2022-03-19 ENCOUNTER — Encounter: Payer: Self-pay | Admitting: Physician Assistant

## 2022-03-19 DIAGNOSIS — M8580 Other specified disorders of bone density and structure, unspecified site: Secondary | ICD-10-CM

## 2022-03-19 NOTE — Progress Notes (Signed)
   Follow-Up Visit   Subjective  Yvonne Wu is a 66 y.o. female who presents for the following: Skin Problem (Has dermatitis on her face and needs refills on her medication. Minocycline and Elidel. ).   The following portions of the chart were reviewed this encounter and updated as appropriate:  Tobacco  Allergies  Meds  Problems  Med Hx  Surg Hx  Fam Hx      Objective  Well appearing patient in no apparent distress; mood and affect are within normal limits.  A focused examination was performed including face, neck and chest. Relevant physical exam findings are noted in the Assessment and Plan.  Head - Anterior (Face) Thin scaly erythematous papules coalescing to plaques.    Assessment & Plan  Dermatitis Head - Anterior (Face)  Will give her a sample of opzelura once drug rep brings by samples  pimecrolimus (ELIDEL) 1 % cream - Head - Anterior (Face) Apply topically 2 (two) times daily.    I, ,, PA-C, have reviewed all documentation's for this visit.  The documentation on 03/19/22 for the exam, diagnosis, procedures and orders are all accurate and complete.

## 2022-04-29 ENCOUNTER — Other Ambulatory Visit: Payer: Self-pay | Admitting: Family Medicine

## 2022-04-29 DIAGNOSIS — M545 Low back pain, unspecified: Secondary | ICD-10-CM

## 2022-04-30 NOTE — Telephone Encounter (Signed)
Requested Prescriptions  Pending Prescriptions Disp Refills  . celecoxib (CELEBREX) 200 MG capsule [Pharmacy Med Name: CELECOXIB 200 MG CAPSULE] 180 capsule 0    Sig: TAKE (1) CAPSULE BY MOUTH TWICE DAILY.     Analgesics:  COX2 Inhibitors Failed - 04/29/2022  5:17 PM      Failed - Manual Review: Labs are only required if the patient has taken medication for more than 8 weeks.      Passed - HGB in normal range and within 360 days    Hemoglobin  Date Value Ref Range Status  10/24/2021 14.1 11.7 - 15.5 g/dL Final         Passed - Cr in normal range and within 360 days    Creat  Date Value Ref Range Status  10/24/2021 0.70 0.50 - 1.05 mg/dL Final         Passed - HCT in normal range and within 360 days    HCT  Date Value Ref Range Status  10/24/2021 41.9 35.0 - 45.0 % Final         Passed - AST in normal range and within 360 days    AST  Date Value Ref Range Status  10/24/2021 18 10 - 35 U/L Final         Passed - ALT in normal range and within 360 days    ALT  Date Value Ref Range Status  10/24/2021 19 6 - 29 U/L Final         Passed - eGFR is 30 or above and within 360 days    GFR, Est African American  Date Value Ref Range Status  04/02/2020 105 > OR = 60 mL/min/1.62m Final   GFR, Est Non African American  Date Value Ref Range Status  04/02/2020 91 > OR = 60 mL/min/1.722mFinal   eGFR  Date Value Ref Range Status  10/24/2021 96 > OR = 60 mL/min/1.7340minal    Comment:    The eGFR is based on the CKD-EPI 2021 equation. To calculate  the new eGFR from a previous Creatinine or Cystatin C result, go to https://www.kidney.org/professionals/ kdoqi/gfr%5Fcalculator          Passed - Patient is not pregnant      Passed - Valid encounter within last 12 months    Recent Outpatient Visits          6 months ago Hypothyroidism, unspecified type   Bro[facility]ard, WarCammie McgeeD   1 year ago Sore throat   BroParmackard,  WarCammie McgeeD   1 year ago RhiBlakesleecDennard SchaumannrCammie McgeeD   2 years ago Pure hypercholesterolemia   BroWalkerckard, WarCammie McgeeD   3 years ago Essential hypertension   BroHelotesarCammie McgeeD      Future Appointments            In 6 months [place] [place]

## 2022-05-20 ENCOUNTER — Other Ambulatory Visit: Payer: Self-pay | Admitting: Family Medicine

## 2022-05-21 NOTE — Telephone Encounter (Signed)
LOV 10/24/21 Last refill 11/14/21, #30, 0 refills  Please review, thanks!

## 2022-10-14 ENCOUNTER — Ambulatory Visit (INDEPENDENT_AMBULATORY_CARE_PROVIDER_SITE_OTHER): Payer: Medicare Other

## 2022-10-14 VITALS — Ht 62.0 in | Wt 175.0 lb

## 2022-10-14 DIAGNOSIS — Z0000 Encounter for general adult medical examination without abnormal findings: Secondary | ICD-10-CM | POA: Diagnosis not present

## 2022-10-14 NOTE — Progress Notes (Signed)
Subjective:   Yvonne Wu is a 66 y.o. female who presents for an Initial Medicare Annual Wellness Visit.  I connected with  Yvonne Wu on 10/14/22 by a audio enabled telemedicine application and verified that I am speaking with the correct person using two identifiers.  Patient Location: Home  Provider Location: Office/Clinic  I discussed the limitations of evaluation and management by telemedicine. The patient expressed understanding and agreed to proceed.  Review of Systems     Cardiac Risk Factors include: advanced age (>28mn, >>31women);dyslipidemia;hypertension     Objective:    Today's Vitals   10/14/22 1316  Weight: 175 lb (79.4 kg)  Height: '5\' 2"'$  (1.575 m)   Body mass index is 32.01 kg/m.     10/14/2022    1:19 PM 02/13/2015    1:46 PM  Advanced Directives  Does Patient Have a Medical Advance Directive? No   Type of Advance Directive  Healthcare Power of Attorney  Does patient want to make changes to medical advance directive? Yes (MAU/Ambulatory/Procedural Areas - Information given)   Copy of HLeain Chart?  No - copy requested    Current Medications (verified) Outpatient Encounter Medications as of 10/14/2022  Medication Sig   celecoxib (CELEBREX) 200 MG capsule TAKE (1) CAPSULE BY MOUTH TWICE DAILY.   clonazePAM (KLONOPIN) 0.5 MG tablet TAKE (1/2) TABLET BY MOUTH TWICE DAILY AS NEEDED FOR ANXIETY.   EPINEPHrine 0.3 mg/0.3 mL IJ SOAJ injection INJECT INTO THIGH AS NEEDED FOR ALLERGIC REACTION.   fexofenadine (ALLEGRA) 180 MG tablet Take 180 mg by mouth daily. Reported on 04/14/2016   fluticasone (FLONASE) 50 MCG/ACT nasal spray USE 2 SPRAYS IN EACH NOSTRIL ONCE DAILY.   levothyroxine (SYNTHROID) 88 MCG tablet TAKE ONE TABLET BY MOUTH ONCE DAILY.   montelukast (SINGULAIR) 10 MG tablet Take 1 tablet (10 mg total) by mouth at bedtime.   olmesartan (BENICAR) 20 MG tablet TAKE ONE TABLET BY MOUTH ONCE DAILY.   pimecrolimus  (ELIDEL) 1 % cream Apply topically 2 (two) times daily.   No facility-administered encounter medications on file as of 10/14/2022.    Allergies (verified) Codeine   History: Past Medical History:  Diagnosis Date   Anxiety    Depression    situational   Eczema    Hypertension    Hypothyroid    Urinary, incontinence, stress female 02/15/2015   Past Surgical History:  Procedure Laterality Date   BLADDER SUSPENSION N/A 02/15/2015   Procedure: TRANSVAGINAL TAPE (TVT) Mid-Urethral Sling PROCEDURE;  Surgeon: VAzucena Fallen MD;  [facility];  Service: Gynecology;  Laterality: N/A;  2 hrs.   BUNIONECTOMY Left    CYSTOCELE REPAIR N/A 02/15/2015   Procedure: ANTERIOR REPAIR (CYSTOCELE)/Colporrhaphy;  Surgeon: VAzucena Fallen MD;  [facility];  Service: Gynecology;  Laterality: N/A;   CYSTOSCOPY N/A 02/15/2015   Procedure: CYSTOSCOPY;  Surgeon: VAzucena Fallen MD;  [facility];  Service: Gynecology;  Laterality: N/A;   TUBAL LIGATION     Family History  Problem Relation Age of Onset   Asthma Mother    Mental illness Mother    Cancer Father 676      lung cancer   Social History   Socioeconomic History   Marital status: Widowed    Spouse name: Not on file   Number of children: Not on file   Years of education: Not on file   Highest education level: Not on file  Occupational History   Not  on file  Tobacco Use   Smoking status: Former    Types: Cigarettes    Quit date: 02/22/1995    Years since quitting: 27.6   Smokeless tobacco: Never  Substance and Sexual Activity   Alcohol use: No   Drug use: No   Sexual activity: Not Currently  Other Topics Concern   Not on file  Social History Narrative   Works at [employer].   Involves a lot of standing and bending.   Husband passed away in 02-04-13 with leukemia.   Patient states that her daughter is age 80 (as of 43) and has bipolar. This daughter lives with her.   This daughter has a child who is 81 years old and  another child he was 57 years old. They all live with the patient.   Patient also has a son who lives in [place].      Social Determinants of Health   Financial Resource Strain: Low Risk  (10/14/2022)   Overall Financial Resource Strain (CARDIA)    Difficulty of Paying Living Expenses: Not hard at all  Food Insecurity: No Food Insecurity (10/14/2022)   Hunger Vital Sign    Worried About Running Out of Food in the Last Year: Never true    Ran Out of Food in the Last Year: Never true  Transportation Needs: No Transportation Needs (10/14/2022)   PRAPARE - Hydrologist (Medical): No    Lack of Transportation (Non-Medical): No  Physical Activity: Inactive (10/14/2022)   Exercise Vital Sign    Days of Exercise per Week: 0 days    Minutes of Exercise per Session: 0 min  Stress: No Stress Concern Present (10/14/2022)   [place]    Feeling of Stress : Only a little  Social Connections: Moderately Integrated (10/14/2022)   Social Connection and Isolation Panel [NHANES]    Frequency of Communication with Friends and Family: More than three times a week    Frequency of Social Gatherings with Friends and Family: Three times a week    Attends Religious Services: More than 4 times per year    Active Member of Clubs or Organizations: Yes    Attends Archivist Meetings: More than 4 times per year    Marital Status: Widowed    Tobacco Counseling Counseling given: Not Answered   Clinical Intake:  Pre-visit preparation completed: Yes  Pain : No/denies pain   Diabetes: No  How often do you need to have someone help you when you read instructions, pamphlets, or other written materials from your doctor or pharmacy?: 1 - Never  Diabetic?No   Interpreter Needed?: No  Information entered by :: Yvonne Wu   Activities of Daily Living    10/14/2022    1:19 PM  In your  present state of health, do you have any difficulty performing the following activities:  Hearing? 0  Vision? 0  Difficulty concentrating or making decisions? 0  Walking or climbing stairs? 0  Dressing or bathing? 0  Doing errands, shopping? 0  Preparing Food and eating ? N  Using the Toilet? N  In the past six months, have you accidently leaked urine? N  Do you have problems with loss of bowel control? N  Managing your Medications? N  Managing your Finances? N  Housekeeping or managing your Housekeeping? N    Patient Care Team: Susy Frizzle, MD as PCP - General (Family Medicine) Willette Pa,  Ronalee Red, PA-C as Physician Assistant (Dermatology) Avon Gully, NP as Nurse Practitioner (Obstetrics and Gynecology)  Indicate any recent Medical Services you may have received from other than [facility] providers in the past year (date may be approximate).     Assessment:   This is a routine wellness examination for [place].  Hearing/Vision screen Hearing Screening - Comments:: Denies hearing difficulties   Vision Screening - Comments:: Wears rx glasses - up to date with routine eye exams with [facility] Led[facility] Noss     Dietary issues and exercise activities discussed: Current Exercise Habits: The patient does not participate in regular exercise at present   Goals Addressed   None   Depression Screen    10/14/2022    1:18 PM 04/02/2020    9:29 AM 07/08/2018   11:37 AM 01/07/2018   11:40 AM 07/09/2017    9:02 AM  PHQ 2/9 Scores  PHQ - 2 Score 0 0 0 0 0  PHQ- 9 Score  0   0    Fall Risk    10/14/2022    1:17 PM 04/02/2020    9:28 AM  [place] in the past year? 0 0  Number falls in past yr: 0 0  Injury with Fall? 0 0  Risk for fall due to : No Fall Risks   Follow up Falls evaluation completed;Education provided;Falls prevention discussed     FALL RISK PREVENTION PERTAINING TO THE HOME:  Any stairs in or around the home? Yes  If so, are there any without handrails? Yes   Home free of loose throw rugs in walkways, pet beds, electrical cords, etc? Yes  Adequate lighting in your home to reduce risk of falls? Yes   ASSISTIVE DEVICES UTILIZED TO PREVENT FALLS:  Life alert? No  Use of a cane, walker or w/c? No  Grab bars in the bathroom? Yes  Shower chair or bench in shower? No  Elevated toilet seat or a handicapped toilet? Yes   TIMED UP AND GO:  Was the test performed? No . Telephonic visit    Cognitive Function:        10/14/2022    1:19 PM  6CIT Screen  What Year? 0 points  What month? 0 points  What time? 0 points  Count back from 20 0 points  Months in reverse 0 points  Repeat phrase 0 points  Total Score 0 points    Immunizations Immunization History  Administered Date(s) Administered   COVID-19, mRNA, vaccine(Comirnaty)12 years and older 09/29/2022   Influenza, High Dose Seasonal PF 09/08/2019   Influenza,inj,Quad PF,6+ Mos 07/09/2017, 07/08/2018, 07/20/2019   Influenza-Unspecified 07/20/2020, 09/26/2022   Moderna Sars-Covid-2 Vaccination 12/26/2019, 01/24/2020, 10/04/2020   Tdap 10/02/2011    TDAP status: Due, Education has been provided regarding the importance of this vaccine. Advised may receive this vaccine at local pharmacy or Health Dept. Aware to provide a copy of the vaccination record if obtained from local pharmacy or Health Dept. Verbalized acceptance and understanding.  Flu Vaccine status: Up to date  Pneumococcal vaccine status: Due, Education has been provided regarding the importance of this vaccine. Advised may receive this vaccine at local pharmacy or Health Dept. Aware to provide a copy of the vaccination record if obtained from local pharmacy or Health Dept. Verbalized acceptance and understanding.  Covid-19 vaccine status: Information provided on how to obtain vaccines.   Qualifies for Shingles Vaccine? Yes   Zostavax completed No   Shingrix Completed?: No.  Education has been provided regarding the  importance of this vaccine. Patient has been advised to call insurance company to determine out of pocket expense if they have not yet received this vaccine. Advised may also receive vaccine at local pharmacy or Health Dept. Verbalized acceptance and understanding.  Screening Tests Health Maintenance  Topic Date Due   Zoster Vaccines- Shingrix (1 of 2) Never done   Pneumonia Vaccine 93+ Years old (1 - PCV) Never done   DTaP/Tdap/Td (2 - Td or Tdap) 10/01/2021   COVID-19 Vaccine (5 - 2023-24 season) 11/24/2022   Medicare Annual Wellness (AWV)  10/15/2023   MAMMOGRAM  04/08/2024   COLONOSCOPY (Pts 45-24yr Insurance coverage will need to be confirmed)  12/26/2028   INFLUENZA VACCINE  Completed   DEXA SCAN  Completed   Hepatitis C Screening  Completed   HPV VACCINES  Aged Out    Health Maintenance  Health Maintenance Due  Topic Date Due   Zoster Vaccines- Shingrix (1 of 2) Never done   Pneumonia Vaccine 66 Years old (1 - PCV) Never done   DTaP/Tdap/Td (2 - Td or Tdap) 10/01/2021    Colorectal cancer screening: Type of screening: Colonoscopy. Completed 12/27/18. Repeat every 10 years  Mammogram status: Completed 04/08/22. Repeat every year  Bone Density status: Completed 03/19/22. Results reflect: Bone density results: OSTEOPENIA. Repeat every 2 years.  Lung Cancer Screening: (Low Dose CT Chest recommended if Age 66-80years, 30 pack-year currently smoking OR have quit w/in 15years.) does not qualify.   Lung Cancer Screening Referral: n/a   Additional Screening:  Hepatitis C Screening: does qualify; Completed 07/14/17  Vision Screening: Recommended annual ophthalmology exams for early detection of glaucoma and other disorders of the eye. Is the patient up to date with their annual eye exam?  Yes  Who is the provider or what is the name of the office in which the patient attends annual eye exams? [facility]. EL[facility]ell Noss If pt is not established with a provider, would they like to be  referred to a provider to establish care? No .   Dental Screening: Recommended annual dental exams for proper oral hygiene  Community Resource Referral / Chronic Care Management: CRR required this visit?  No   CCM required this visit?  No      Plan:     I have personally reviewed and noted the following in the patient's chart:   Medical and social history Use of alcohol, tobacco or illicit drugs  Current medications and supplements including opioid prescriptions. Patient is not currently taking opioid prescriptions. Functional ability and status Nutritional status Physical activity Advanced directives List of other physicians Hospitalizations, surgeries, and ER visits in previous 12 months Vitals Screenings to include cognitive, depression, and falls Referrals and appointments  In addition, I have reviewed and discussed with patient certain preventive protocols, quality metrics, and best practice recommendations. A written personalized care plan for preventive services as well as general preventive health recommendations were provided to patient.     SVanetta Mulders [place]  [phone]  Due to this being a virtual visit, the after visit summary with patients personalized plan was offered to patient via mail or my-chart. Patient would like to access on my-chart  Nurse Notes: No concerns

## 2022-10-14 NOTE — Patient Instructions (Signed)
[name] , Thank you for taking time to come for your Medicare Wellness Visit. I appreciate your ongoing commitment to your health goals. Please review the following plan we discussed and let me know if I can assist you in the future.   These are the goals we discussed:  Goals   None     This is a list of the screening recommended for you and due dates:  Health Maintenance  Topic Date Due   Zoster (Shingles) Vaccine (1 of 2) Never done   Pneumonia Vaccine (1 - PCV) Never done   DTaP/Tdap/Td vaccine (2 - Td or Tdap) 10/01/2021   Flu Shot  05/20/2022   COVID-19 Vaccine (4 - 2023-24 season) 06/20/2022   Medicare Annual Wellness Visit  10/15/2023   Mammogram  04/08/2024   Colon Cancer Screening  12/26/2028   DEXA scan (bone density measurement)  Completed   Hepatitis C Screening: USPSTF Recommendation to screen - Ages 18-79 yo.  Completed   HPV Vaccine  Aged Out    Advanced directives: Advance directive discussed with you today. I have provided a copy for you to complete at home and have notarized. Once this is complete please bring a copy in to our office so we can scan it into your chart.   Conditions/risks identified: Aim for 30 minutes of exercise or brisk walking, 6-8 glasses of water, and 5 servings of fruits and vegetables each day.   Next appointment: Follow up in one year for your annual wellness visit    Preventive Care 65 Years and Older, Female Preventive care refers to lifestyle choices and visits with your health care provider that can promote health and wellness. What does preventive care include? A yearly physical exam. This is also called an annual well check. Dental exams once or twice a year. Routine eye exams. Ask your health care provider how often you should have your eyes checked. Personal lifestyle choices, including: Daily care of your teeth and gums. Regular physical activity. Eating a healthy diet. Avoiding tobacco and drug use. Limiting alcohol  use. Practicing safe sex. Taking low-dose aspirin every day. Taking vitamin and mineral supplements as recommended by your health care provider. What happens during an annual well check? The services and screenings done by your health care provider during your annual well check will depend on your age, overall health, lifestyle risk factors, and family history of disease. Counseling  Your health care provider may ask you questions about your: Alcohol use. Tobacco use. Drug use. Emotional well-being. Home and relationship well-being. Sexual activity. Eating habits. History of falls. Memory and ability to understand (cognition). Work and work Statistician. Reproductive health. Screening  You may have the following tests or measurements: Height, weight, and BMI. Blood pressure. Lipid and cholesterol levels. These may be checked every 5 years, or more frequently if you are over 16 years old. Skin check. Lung cancer screening. You may have this screening every year starting at age 50 if you have a 30-pack-year history of smoking and currently smoke or have quit within the past 15 years. Fecal occult blood test (FOBT) of the stool. You may have this test every year starting at age 69. Flexible sigmoidoscopy or colonoscopy. You may have a sigmoidoscopy every 5 years or a colonoscopy every 10 years starting at age 6. Hepatitis C blood test. Hepatitis B blood test. Sexually transmitted disease (STD) testing. Diabetes screening. This is done by checking your blood sugar (glucose) after you have not eaten for a  while (fasting). You may have this done every 1-3 years. Bone density scan. This is done to screen for osteoporosis. You may have this done starting at age 57. Mammogram. This may be done every 1-2 years. Talk to your health care provider about how often you should have regular mammograms. Talk with your health care provider about your test results, treatment options, and if necessary,  the need for more tests. Vaccines  Your health care provider may recommend certain vaccines, such as: Influenza vaccine. This is recommended every year. Tetanus, diphtheria, and acellular pertussis (Tdap, Td) vaccine. You may need a Td booster every 10 years. Zoster vaccine. You may need this after age 46. Pneumococcal 13-valent conjugate (PCV13) vaccine. One dose is recommended after age 22. Pneumococcal polysaccharide (PPSV23) vaccine. One dose is recommended after age 71. Talk to your health care provider about which screenings and vaccines you need and how often you need them. This information is not intended to replace advice given to you by your health care provider. Make sure you discuss any questions you have with your health care provider. Document Released: 11/02/2015 Document Revised: 06/25/2016 Document Reviewed: 08/07/2015 Elsevier Interactive Patient Education  2017 [place] Prevention in the Home Falls can cause injuries. They can happen to people of all ages. There are many things you can do to make your home safe and to help prevent falls. What can I do on the outside of my home? Regularly fix the edges of walkways and driveways and fix any cracks. Remove anything that might make you trip as you walk through a door, such as a raised step or threshold. Trim any bushes or trees on the path to your home. Use bright outdoor lighting. Clear any walking paths of anything that might make someone trip, such as rocks or tools. Regularly check to see if handrails are loose or broken. Make sure that both sides of any steps have handrails. Any raised decks and porches should have guardrails on the edges. Have any leaves, snow, or ice cleared regularly. Use sand or salt on walking paths during winter. Clean up any spills in your garage right away. This includes oil or grease spills. What can I do in the bathroom? Use night lights. Install grab bars by the toilet and in the  tub and shower. Do not use towel bars as grab bars. Use non-skid mats or decals in the tub or shower. If you need to sit down in the shower, use a plastic, non-slip stool. Keep the floor dry. Clean up any water that spills on the floor as soon as it happens. Remove soap buildup in the tub or shower regularly. Attach bath mats securely with double-sided non-slip rug tape. Do not have throw rugs and other things on the floor that can make you trip. What can I do in the bedroom? Use night lights. Make sure that you have a light by your bed that is easy to reach. Do not use any sheets or blankets that are too big for your bed. They should not hang down onto the floor. Have a firm chair that has side arms. You can use this for support while you get dressed. Do not have throw rugs and other things on the floor that can make you trip. What can I do in the kitchen? Clean up any spills right away. Avoid walking on wet floors. Keep items that you use a lot in easy-to-reach places. If you need to reach something above you,  use a strong step stool that has a grab bar. Keep electrical cords out of the way. Do not use floor polish or wax that makes floors slippery. If you must use wax, use non-skid floor wax. Do not have throw rugs and other things on the floor that can make you trip. What can I do with my stairs? Do not leave any items on the stairs. Make sure that there are handrails on both sides of the stairs and use them. Fix handrails that are broken or loose. Make sure that handrails are as long as the stairways. Check any carpeting to make sure that it is firmly attached to the stairs. Fix any carpet that is loose or worn. Avoid having throw rugs at the top or bottom of the stairs. If you do have throw rugs, attach them to the floor with carpet tape. Make sure that you have a light switch at the top of the stairs and the bottom of the stairs. If you do not have them, ask someone to add them for  you. What else can I do to help prevent falls? Wear shoes that: Do not have high heels. Have rubber bottoms. Are comfortable and fit you well. Are closed at the toe. Do not wear sandals. If you use a stepladder: Make sure that it is fully opened. Do not climb a closed stepladder. Make sure that both sides of the stepladder are locked into place. Ask someone to hold it for you, if possible. Clearly mark and make sure that you can see: Any grab bars or handrails. First and last steps. Where the edge of each step is. Use tools that help you move around (mobility aids) if they are needed. These include: Canes. Walkers. Scooters. Crutches. Turn on the lights when you go into a dark area. Replace any light bulbs as soon as they burn out. Set up your furniture so you have a clear path. Avoid moving your furniture around. If any of your floors are uneven, fix them. If there are any pets around you, be aware of where they are. Review your medicines with your doctor. Some medicines can make you feel dizzy. This can increase your chance of falling. Ask your doctor what other things that you can do to help prevent falls. This information is not intended to replace advice given to you by your health care provider. Make sure you discuss any questions you have with your health care provider. Document Released: 08/02/2009 Document Revised: 03/13/2016 Document Reviewed: 11/10/2014 Elsevier Interactive Patient Education  2017 [employer].

## 2022-10-15 ENCOUNTER — Telehealth: Payer: Self-pay | Admitting: Family Medicine

## 2022-10-28 ENCOUNTER — Encounter: Payer: Self-pay | Admitting: Family Medicine

## 2022-10-28 ENCOUNTER — Ambulatory Visit (INDEPENDENT_AMBULATORY_CARE_PROVIDER_SITE_OTHER): Payer: Medicare Other | Admitting: Family Medicine

## 2022-10-28 VITALS — BP 128/76 | HR 59 | Temp 97.8°F | Ht 62.0 in | Wt 178.0 lb

## 2022-10-28 DIAGNOSIS — E039 Hypothyroidism, unspecified: Secondary | ICD-10-CM | POA: Diagnosis not present

## 2022-10-28 DIAGNOSIS — Z23 Encounter for immunization: Secondary | ICD-10-CM | POA: Diagnosis not present

## 2022-10-28 NOTE — Progress Notes (Signed)
Subjective:    Patient ID: Yvonne Wu, female    DOB: 03-13-1956, 67 y.o.   MRN: [id number]  HPI Patient is a very pleasant 67 year old Caucasian female here today for a checkup.  She has history of hypothyroidism and is currently on levothyroxine.  She is overdue to recheck a TSH.  Colonoscopy in 2020 and mammogram in 6/23.  DEXA last year showed T score -2.3 in right hip.  Patient is due for Prevnar 20.  She is already had her flu shot.  She is already had her COVID booster.  She denies any chest pain shortness of breath or dyspnea on exertion.  However her cholesterol has been elevated in the past.  She admits that she is eating more fast food and saturated fat Immunization History  Administered Date(s) Administered   COVID-19, mRNA, vaccine(Comirnaty)12 years and older 09/29/2022   Influenza, High Dose Seasonal PF 09/08/2019   Influenza,inj,Quad PF,6+ Mos 07/09/2017, 07/08/2018, 07/20/2019   Influenza-Unspecified 07/20/2020, 09/26/2022   Moderna Sars-Covid-2 Vaccination 12/26/2019, 01/24/2020, 10/04/2020   Tdap 10/02/2011    Past Medical History:  Diagnosis Date   Anxiety    Depression    situational   Eczema    Hypertension    Hypothyroid    Urinary, incontinence, stress female 02/15/2015   Past Surgical History:  Procedure Laterality Date   BLADDER SUSPENSION N/A 02/15/2015   Procedure: TRANSVAGINAL TAPE (TVT) Mid-Urethral Sling PROCEDURE;  Surgeon: Azucena Fallen, MD;  Location: [facility];  Service: Gynecology;  Laterality: N/A;  2 hrs.   BUNIONECTOMY Left    CYSTOCELE REPAIR N/A 02/15/2015   Procedure: ANTERIOR REPAIR (CYSTOCELE)/Colporrhaphy;  Surgeon: Azucena Fallen, MD;  Location: [facility];  Service: Gynecology;  Laterality: N/A;   CYSTOSCOPY N/A 02/15/2015   Procedure: CYSTOSCOPY;  Surgeon: Azucena Fallen, MD;  Location: [facility];  Service: Gynecology;  Laterality: N/A;   TUBAL LIGATION     Current Outpatient Medications on File Prior to Visit  Medication Sig Dispense Refill    celecoxib (CELEBREX) 200 MG capsule TAKE (1) CAPSULE BY MOUTH TWICE DAILY. 180 capsule 0   clonazePAM (KLONOPIN) 0.5 MG tablet TAKE (1/2) TABLET BY MOUTH TWICE DAILY AS NEEDED FOR ANXIETY. 30 tablet 0   fexofenadine (ALLEGRA) 180 MG tablet Take 180 mg by mouth daily. Reported on 04/14/2016     fluticasone (FLONASE) 50 MCG/ACT nasal spray USE 2 SPRAYS IN EACH NOSTRIL ONCE DAILY. 16 g 5   levothyroxine (SYNTHROID) 88 MCG tablet TAKE ONE TABLET BY MOUTH ONCE DAILY. 90 tablet 3   montelukast (SINGULAIR) 10 MG tablet Take 1 tablet (10 mg total) by mouth at bedtime. 30 tablet 3   olmesartan (BENICAR) 20 MG tablet TAKE ONE TABLET BY MOUTH ONCE DAILY. 90 tablet 3   No current facility-administered medications on file prior to visit.   Allergies  Allergen Reactions   Codeine     Per patient she gets real sick    Social History   Socioeconomic History   Marital status: Widowed    Spouse name: Not on file   Number of children: Not on file   Years of education: Not on file   Highest education level: Not on file  Occupational History   Not on file  Tobacco Use   Smoking status: Former    Types: Cigarettes    Quit date: 02/22/1995    Years since quitting: 27.6   Smokeless tobacco: Never  Substance and Sexual Activity   Alcohol use: No   Drug use: No  Sexual activity: Not Currently  Other Topics Concern   Not on file  Social History Narrative   Works at [employer].   Involves a lot of standing and bending.   Husband passed away in 02/24/2013 with leukemia.   Patient states that her daughter is age 50 (as of 59) and has bipolar. This daughter lives with her.   This daughter has a child who is 43 years old and another child he was 63 years old. They all live with the patient.   Patient also has a son who lives in [place].      Social Determinants of Health   Financial Resource Strain: Low Risk  (10/14/2022)   Overall Financial Resource Strain (CARDIA)    Difficulty of Paying Living  Expenses: Not hard at all  Food Insecurity: No Food Insecurity (10/14/2022)   Hunger Vital Sign    Worried About Running Out of Food in the Last Year: Never true    Ran Out of Food in the Last Year: Never true  Transportation Needs: No Transportation Needs (10/14/2022)   PRAPARE - Hydrologist (Medical): No    Lack of Transportation (Non-Medical): No  Physical Activity: Inactive (10/14/2022)   Exercise Vital Sign    Days of Exercise per Week: 0 days    Minutes of Exercise per Session: 0 min  Stress: No Stress Concern Present (10/14/2022)   [place]    Feeling of Stress : Only a little  Social Connections: Moderately Integrated (10/14/2022)   Social Connection and Isolation Panel [NHANES]    Frequency of Communication with Friends and Family: More than three times a week    Frequency of Social Gatherings with Friends and Family: Three times a week    Attends Religious Services: More than 4 times per year    Active Member of Clubs or Organizations: Yes    Attends Archivist Meetings: More than 4 times per year    Marital Status: Widowed  Intimate Partner Violence: Not At Risk (10/14/2022)   Humiliation, Afraid, Rape, and Kick questionnaire    Fear of Current or Ex-Partner: No    Emotionally Abused: No    Physically Abused: No    Sexually Abused: No    Review of Systems  All other systems reviewed and are negative.      Objective:   Physical Exam Vitals reviewed.  Constitutional:      General: She is not in acute distress.    Appearance: Normal appearance. She is normal weight. She is not ill-appearing, toxic-appearing or diaphoretic.  Cardiovascular:     Rate and Rhythm: Normal rate and regular rhythm.     Pulses: Normal pulses.     Heart sounds: Normal heart sounds. No murmur heard.    No gallop.  Pulmonary:     Effort: Pulmonary effort is normal. No respiratory  distress.     Breath sounds: Normal breath sounds. No stridor. No wheezing, rhonchi or rales.  Chest:     Chest wall: No tenderness.  Abdominal:     General: Bowel sounds are normal. There is no distension.     Palpations: Abdomen is soft. There is no mass.     Tenderness: There is no abdominal tenderness. There is no guarding or rebound.  Musculoskeletal:     Right lower leg: No edema.     Left lower leg: No edema.  Neurological:     Mental Status:  She is alert.           Assessment & Plan:  Hypothyroidism, unspecified type - Plan: CBC with Differential/Platelet, Lipid panel, TSH, COMPLETE METABOLIC PANEL WITH GFR  Need for pneumococcal 20-valent conjugate vaccination - Plan: Pneumococcal conjugate vaccine 20-valent (Prevnar 20) Check a TSH to ensure adequate treatment of her hypothyroidism.  Meanwhile check a CMP and a lipid panel.  Ideally I like to see her LDL cholesterol below 100.  I encouraged the patient to take fish oil 2000 mg daily and to try to avoid red meat and pork and eat more fruits and vegetables and exercise.  She is already taking calcium and vitamin D.  She received Prevnar 20 today.  Remainder of her vaccinations are up-to-date

## 2022-10-29 LAB — LIPID PANEL
Cholesterol: 224 mg/dL — ABNORMAL HIGH (ref <200)
HDL: 65 mg/dL (ref >=50)
LDL Cholesterol (Calc): 141 mg/dL (calc) — ABNORMAL HIGH
Non-HDL Cholesterol (Calc): 159 mg/dL (calc) — ABNORMAL HIGH (ref <130)
Total CHOL/HDL Ratio: 3.4 (calc) (ref <5.0)
Triglycerides: 83 mg/dL (ref <150)

## 2022-10-29 LAB — COMPLETE METABOLIC PANEL WITH GFR
AG Ratio: 2 (calc) (ref 1.0–2.5)
ALT: 26 U/L (ref 6–29)
AST: 18 U/L (ref 10–35)
Albumin: 4.8 g/dL (ref 3.6–5.1)
Alkaline phosphatase (APISO): 101 U/L (ref 37–153)
BUN: 13 mg/dL (ref 7–25)
CO2: 24 mmol/L (ref 20–32)
Calcium: 9.7 mg/dL (ref 8.6–10.4)
Chloride: 106 mmol/L (ref 98–110)
Creat: 0.68 mg/dL (ref 0.50–1.05)
Globulin: 2.4 g/dL (calc) (ref 1.9–3.7)
Glucose, Bld: 92 mg/dL (ref 65–99)
Potassium: 4.4 mmol/L (ref 3.5–5.3)
Sodium: 142 mmol/L (ref 135–146)
Total Bilirubin: 0.4 mg/dL (ref 0.2–1.2)
Total Protein: 7.2 g/dL (ref 6.1–8.1)
eGFR: 96 mL/min/{1.73_m2} (ref >=60)

## 2022-10-29 LAB — CBC WITH DIFFERENTIAL/PLATELET
Absolute Monocytes: 396 cells/uL (ref 200–950)
Basophils Absolute: 18 cells/uL (ref 0–200)
Basophils Relative: 0.4 %
Eosinophils Absolute: 131 cells/uL (ref 15–500)
Eosinophils Relative: 2.9 %
HCT: 40.5 % (ref 35.0–45.0)
Hemoglobin: 13.8 g/dL (ref 11.7–15.5)
Lymphs Abs: 1778 cells/uL (ref 850–3900)
MCH: 30.3 pg (ref 27.0–33.0)
MCHC: 34.1 g/dL (ref 32.0–36.0)
MCV: 89 fL (ref 80.0–100.0)
MPV: 10.2 fL (ref 7.5–12.5)
Monocytes Relative: 8.8 %
Neutro Abs: 2178 cells/uL (ref 1500–7800)
Neutrophils Relative %: 48.4 %
Platelets: 308 10*3/uL (ref 140–400)
RBC: 4.55 10*6/uL (ref 3.80–5.10)
RDW: 12.5 % (ref 11.0–15.0)
Total Lymphocyte: 39.5 %
WBC: 4.5 10*3/uL (ref 3.8–10.8)

## 2022-10-29 LAB — TSH: TSH: 0.2 mIU/L — ABNORMAL LOW (ref 0.40–4.50)

## 2022-10-30 ENCOUNTER — Other Ambulatory Visit: Payer: Self-pay

## 2022-10-30 ENCOUNTER — Ambulatory Visit: Payer: Medicare Other | Admitting: Physician Assistant

## 2022-10-30 MED ORDER — LEVOTHYROXINE SODIUM 75 MCG PO TABS: 75.0000 ug | ORAL_TABLET | Freq: Every day | ORAL | 3 refills | Status: DC

## 2022-11-05 NOTE — Telephone Encounter (Signed)
Erroneous encounter. Please disregard.

## 2022-12-04 ENCOUNTER — Other Ambulatory Visit: Payer: Self-pay | Admitting: Family Medicine

## 2022-12-04 NOTE — Telephone Encounter (Signed)
Requested Prescriptions  Pending Prescriptions Disp Refills   olmesartan (BENICAR) 20 MG tablet [Pharmacy Med Name: OLMESARTAN MEDOXOMIL 20 MG TAB] 90 tablet 1    Sig: TAKE ONE TABLET BY MOUTH ONCE DAILY.     Cardiovascular:  Angiotensin Receptor Blockers Failed - 12/04/2022 10:04 AM      Failed - Valid encounter within last 6 months    Recent Outpatient Visits           1 year ago Hypothyroidism, unspecified type   [place] Pickard, Cammie Mcgee, MD   1 year ago Sore throat   [place] [facility] Schaumann, Cammie Mcgee, MD   2 years ago [place] Susy Frizzle, MD   2 years ago Pure hypercholesterolemia   [place] Dennard Schaumann, Cammie Mcgee, MD   3 years ago Essential hypertension   [place] Pickard, Cammie Mcgee, MD              Passed - Cr in normal range and within 180 days    Creat  Date Value Ref Range Status  10/28/2022 0.68 0.50 - 1.05 mg/dL Final         Passed - K in normal range and within 180 days    Potassium  Date Value Ref Range Status  10/28/2022 4.4 3.5 - 5.3 mmol/L Final         Passed - Patient is not pregnant      Passed - Last BP in normal range    BP Readings from Last 1 Encounters:  10/28/22 128/76

## 2022-12-18 ENCOUNTER — Other Ambulatory Visit: Payer: PRIVATE HEALTH INSURANCE

## 2022-12-31 ENCOUNTER — Other Ambulatory Visit: Payer: Medicare Other

## 2022-12-31 DIAGNOSIS — E7800 Pure hypercholesterolemia, unspecified: Secondary | ICD-10-CM

## 2022-12-31 DIAGNOSIS — I10 Essential (primary) hypertension: Secondary | ICD-10-CM

## 2022-12-31 DIAGNOSIS — E039 Hypothyroidism, unspecified: Secondary | ICD-10-CM

## 2023-01-01 LAB — COMPLETE METABOLIC PANEL WITH GFR
AG Ratio: 1.8 (calc) (ref 1.0–2.5)
ALT: 19 U/L (ref 6–29)
AST: 16 U/L (ref 10–35)
Albumin: 4.2 g/dL (ref 3.6–5.1)
Alkaline phosphatase (APISO): 94 U/L (ref 37–153)
BUN: 15 mg/dL (ref 7–25)
CO2: 27 mmol/L (ref 20–32)
Calcium: 9 mg/dL (ref 8.6–10.4)
Chloride: 107 mmol/L (ref 98–110)
Creat: 0.71 mg/dL (ref 0.50–1.05)
Globulin: 2.4 g/dL (calc) (ref 1.9–3.7)
Glucose, Bld: 81 mg/dL (ref 65–99)
Potassium: 4.4 mmol/L (ref 3.5–5.3)
Sodium: 143 mmol/L (ref 135–146)
Total Bilirubin: 0.5 mg/dL (ref 0.2–1.2)
Total Protein: 6.6 g/dL (ref 6.1–8.1)
eGFR: 94 mL/min/{1.73_m2} (ref >=60)

## 2023-01-01 LAB — LIPID PANEL
Cholesterol: 217 mg/dL — ABNORMAL HIGH (ref <200)
HDL: 60 mg/dL (ref >=50)
LDL Cholesterol (Calc): 141 mg/dL (calc) — ABNORMAL HIGH
Non-HDL Cholesterol (Calc): 157 mg/dL (calc) — ABNORMAL HIGH (ref <130)
Total CHOL/HDL Ratio: 3.6 (calc) (ref <5.0)
Triglycerides: 66 mg/dL (ref <150)

## 2023-01-01 LAB — TSH: TSH: 1.08 mIU/L (ref 0.40–4.50)

## 2023-01-05 ENCOUNTER — Other Ambulatory Visit: Payer: Self-pay | Admitting: Family Medicine

## 2023-01-05 DIAGNOSIS — M545 Low back pain, unspecified: Secondary | ICD-10-CM

## 2023-03-09 ENCOUNTER — Other Ambulatory Visit: Payer: Self-pay | Admitting: Family Medicine

## 2023-03-10 DIAGNOSIS — M7542 Impingement syndrome of left shoulder: Secondary | ICD-10-CM | POA: Insufficient documentation

## 2023-03-10 NOTE — Telephone Encounter (Signed)
Requested Prescriptions  Pending Prescriptions Disp Refills   SYNTHROID 75 MCG tablet [Pharmacy Med Name: SYNTHROID TABLET] 90 tablet 1    Sig: Take 1 tablet (75 mcg total) by mouth daily before breakfast.     Endocrinology:  Hypothyroid Agents Failed - 03/09/2023 12:51 PM      Failed - Valid encounter within last 12 months    Recent Outpatient Visits           1 year ago Hypothyroidism, unspecified type   [facility] Donita Brooks, MD   2 years ago Sore throat   [facility] Tanya Nones, Priscille Heidelberg, MD   2 years ago Rhinosinusitis   [facility] Tanya Nones, Priscille Heidelberg, MD   2 years ago Pure hypercholesterolemia   [facility] Pickard, Priscille Heidelberg, MD   3 years ago Essential hypertension   [facility] Pickard, Priscille Heidelberg, MD              Passed - TSH in normal range and within 360 days    TSH  Date Value Ref Range Status  12/31/2022 1.08 0.40 - 4.50 mIU/L Final

## 2023-03-29 ENCOUNTER — Encounter: Payer: Self-pay | Admitting: Emergency Medicine

## 2023-03-29 ENCOUNTER — Ambulatory Visit
Admission: EM | Admit: 2023-03-29 | Discharge: 2023-03-29 | Disposition: A | Discharge status: Home / Self Care | Payer: Medicare Other | Attending: Nurse Practitioner | Admitting: Nurse Practitioner

## 2023-03-29 DIAGNOSIS — B029 Zoster without complications: Secondary | ICD-10-CM | POA: Diagnosis not present

## 2023-03-29 MED ORDER — GABAPENTIN 100 MG PO CAPS: 100.0000 mg | ORAL_CAPSULE | Freq: Three times a day (TID) | ORAL | 0 refills | Status: DC

## 2023-03-29 MED ORDER — VALACYCLOVIR HCL 1 G PO TABS: 1000.0000 mg | ORAL_TABLET | Freq: Three times a day (TID) | ORAL | 0 refills | Status: DC

## 2023-03-29 NOTE — ED Provider Notes (Signed)
[facility]    CSN: [id number] Arrival date & time: 03/29/23  0902      History   Chief Complaint No chief complaint on file.   HPI Yvonne Wu is a 67 y.o. female.   The history is provided by the patient.   The patient presents for complaints of rash that started on the left side of her back and is now under the left breast.  Symptoms started approximately 2 to 3 days ago.  Patient states the rash on her back blistered.  She states now the rashes under the left breast.  She states the rash is painful, and has a tingling sensation.  Patient denies fever, chills, chest pain, abdominal pain, nausea, vomiting, or diarrhea.  Patient is unsure if she had chickenpox as a child.  She states that she did receive the shingles vaccine in 2020, she is unsure if she received 1 or 2 of the shots.  Patient states she has been using a lotion for sunburn to the areas.  Past Medical History:  Diagnosis Date   Anxiety    Depression    situational   Eczema    Hypertension    Hypothyroid    Urinary, incontinence, stress female 02/15/2015    Patient Active Problem List   Diagnosis Date Noted   Hepatitis C 07/14/2017   Osteopenia 01/24/2016   Cystocele 02/15/2015   Urinary, incontinence, stress female 02/15/2015   Low back pain 08/08/2013   Anxiety    Depression    Hypertension    Hypothyroid     Past Surgical History:  Procedure Laterality Date   BLADDER SUSPENSION N/A 02/15/2015   Procedure: TRANSVAGINAL TAPE (TVT) Mid-Urethral Sling PROCEDURE;  Surgeon: Shea Evans, MD;  Location: [facility];  Service: Gynecology;  Laterality: N/A;  2 hrs.   BUNIONECTOMY Left    CYSTOCELE REPAIR N/A 02/15/2015   Procedure: ANTERIOR REPAIR (CYSTOCELE)/Colporrhaphy;  Surgeon: Shea Evans, MD;  Location: [facility];  Service: Gynecology;  Laterality: N/A;   CYSTOSCOPY N/A 02/15/2015   Procedure: CYSTOSCOPY;  Surgeon: Shea Evans, MD;  Location: [facility];  Service: Gynecology;  Laterality: N/A;    TUBAL LIGATION      OB History   No obstetric history on file.      Home Medications    Prior to Admission medications   Medication Sig Start Date End Date Taking? Authorizing Provider  gabapentin (NEURONTIN) 100 MG capsule Take 1 capsule (100 mg total) by mouth 3 (three) times daily for 7 days. 03/29/23 04/05/23 Yes -Warren, Sadie Haber, NP  valACYclovir (VALTREX) 1000 MG tablet Take 1 tablet (1,000 mg total) by mouth 3 (three) times daily. 03/29/23  Yes -Warren, Sadie Haber, NP  celecoxib (CELEBREX) 200 MG capsule TAKE (1) CAPSULE BY MOUTH TWICE DAILY. 01/05/23   Donita Brooks, MD  fexofenadine (ALLEGRA) 180 MG tablet Take 180 mg by mouth daily. Reported on 04/14/2016    [provider]  levothyroxine (SYNTHROID) 75 MCG tablet Take 1 tablet (75 mcg total) by mouth daily before breakfast. 03/10/23   Donita Brooks, MD  olmesartan (BENICAR) 20 MG tablet TAKE ONE TABLET BY MOUTH ONCE DAILY. 12/04/22   Donita Brooks, MD    Family History Family History  Problem Relation Age of Onset   Asthma Mother    Mental illness Mother    Cancer Father 55       lung cancer    Social History Social History   Tobacco Use   Smoking  status: Former    Types: Cigarettes    Quit date: 02/22/1995    Years since quitting: 28.1   Smokeless tobacco: Never  Substance Use Topics   Alcohol use: No   Drug use: No     Allergies   Codeine   Review of Systems Review of Systems Per HPI  Physical Exam Triage Vital Signs ED Triage Vitals  Enc Vitals Group     BP 03/29/23 0933 (!) 179/88     Pulse Rate 03/29/23 0933 62     Resp 03/29/23 0933 16     Temp 03/29/23 0933 98.3 F (36.8 C)     Temp Source 03/29/23 0933 Oral     SpO2 03/29/23 0933 97 %     Weight --      Height --      Head Circumference --      Peak Flow --      Pain Score 03/29/23 0936 2     Pain Loc --      Pain Edu? --      Excl. in GC? --    No data found.  Updated Vital Signs BP (!) 179/88 (BP  Location: Right Arm)   Pulse 62   Temp 98.3 F (36.8 C) (Oral)   Resp 16   SpO2 97%   Visual Acuity Right Eye Distance:   Left Eye Distance:   Bilateral Distance:    Right Eye Near:   Left Eye Near:    Bilateral Near:     Physical Exam Vitals and nursing note reviewed.  Constitutional:      General: She is not in acute distress.    Appearance: Normal appearance.  HENT:     Head: Normocephalic.  Eyes:     Extraocular Movements: Extraocular movements intact.     Pupils: Pupils are equal, round, and reactive to light.  Cardiovascular:     Rate and Rhythm: Normal rate and regular rhythm.     Pulses: Normal pulses.     Heart sounds: Normal heart sounds.  Pulmonary:     Effort: Pulmonary effort is normal.     Breath sounds: Normal breath sounds.  Abdominal:     General: Bowel sounds are normal.     Palpations: Abdomen is soft.  Musculoskeletal:     Cervical back: Normal range of motion.  Lymphadenopathy:     Cervical: No cervical adenopathy.  Skin:    General: Skin is warm and dry.     Findings: Rash present.     Comments: Erythematous patch noted to the left mid back and multiple erythematous patches under the left breast.  Areas are vesicular, tender to palpation.  There is no oozing, fluctuance, or drainage present.  Neurological:     General: No focal deficit present.     Mental Status: She is alert and oriented to person, place, and time.  Psychiatric:        Mood and Affect: Mood normal.        Behavior: Behavior normal.      UC Treatments / Results  Labs (all labs ordered are listed, but only abnormal results are displayed) Labs Reviewed - No data to display  EKG   Radiology No results found.  Procedures Procedures (including critical care time)  Medications Ordered in UC Medications - No data to display  Initial Impression / Assessment and Plan / UC Course  I have reviewed the triage vital signs and the nursing notes.  Pertinent labs &  imaging results that were available during my care of the patient were reviewed by me and considered in my medical decision making (see chart for details).  The patient is well-appearing, she is in no acute distress, vital signs are stable.  Symptoms consistent with shingles.  Will start patient on valacyclovir 1 g 3 times daily.  For the nerve pain and burning sensation she is experiencing, gabapentin 100 mg 3 times daily was prescribed.  Supportive care recommendations were provided and discussed with the patient to include over-the-counter Tylenol for pain or discomfort, keeping the areas covered if they are draining, and allowing for plenty of rest.  Patient advised to follow-up with her PCP if symptoms fail to improve.  Patient is in agreement with this plan of care and verbalizes understanding.  All questions were answered.  Patient stable for discharge.  Final Clinical Impressions(s) / UC Diagnoses   Final diagnoses:  Herpes zoster without complication     Discharge Instructions      Take medication as prescribed. Increase fluids and allow for plenty of rest. May take over-the-counter Tylenol as needed for pain, fever, or general discomfort. If the areas of the rash begin to ooze, keep them covered. Do not scratch or disrupt the areas. May apply cool compresses to the area to help with pain or discomfort. If your symptoms fail to improve, please follow-up with your primary care physician for further evaluation.  Follow-up as needed.     ED Prescriptions     Medication Sig Dispense Auth. Provider   valACYclovir (VALTREX) 1000 MG tablet Take 1 tablet (1,000 mg total) by mouth 3 (three) times daily. 21 tablet -Warren, Sadie Haber, NP   gabapentin (NEURONTIN) 100 MG capsule Take 1 capsule (100 mg total) by mouth 3 (three) times daily for 7 days. 21 capsule -Warren, Sadie Haber, NP      PDMP not reviewed this encounter.   Abran Cantor, NP 03/29/23 [phone]

## 2023-03-29 NOTE — Discharge Instructions (Addendum)
Take medication as prescribed. Increase fluids and allow for plenty of rest. May take over-the-counter Tylenol as needed for pain, fever, or general discomfort. If the areas of the rash begin to ooze, keep them covered. Do not scratch or disrupt the areas. May apply cool compresses to the area to help with pain or discomfort. If your symptoms fail to improve, please follow-up with your primary care physician for further evaluation.  Follow-up as needed.

## 2023-03-29 NOTE — ED Triage Notes (Signed)
Rash on back 2 to 3 days ago.  Now has rash under left breast.  Blister type rash on back.  States rash is painful.

## 2023-06-10 ENCOUNTER — Other Ambulatory Visit: Payer: Self-pay | Admitting: Family Medicine

## 2023-06-11 NOTE — Telephone Encounter (Signed)
Requested Prescriptions  Pending Prescriptions Disp Refills   olmesartan (BENICAR) 20 MG tablet [Pharmacy Med Name: OLMESARTAN MEDOXOMIL 20 MG TAB] 90 tablet 0    Sig: TAKE ONE TABLET BY MOUTH ONCE DAILY.     Cardiovascular:  Angiotensin Receptor Blockers Failed - 06/10/2023  9:27 AM      Failed - Last BP in normal range    BP Readings from Last 1 Encounters:  03/29/23 (!) 179/88         Failed - Valid encounter within last 6 months    Recent Outpatient Visits           1 year ago Hypothyroidism, unspecified type   [facility] Tanya Nones, Priscille Heidelberg, MD   2 years ago Sore throat   [facility] Tanya Nones, Priscille Heidelberg, MD   2 years ago Rhinosinusitis   [facility] Tanya Nones, Priscille Heidelberg, MD   3 years ago Pure hypercholesterolemia   [facility] Tanya Nones, Priscille Heidelberg, MD   4 years ago Essential hypertension   [facility] Pickard, Priscille Heidelberg, MD       Future Appointments             In 1 week Tanya Nones, Priscille Heidelberg, MD [facility]            Passed - Cr in normal range and within 180 days    Creat  Date Value Ref Range Status  12/31/2022 0.71 0.50 - 1.05 mg/dL Final         Passed - K in normal range and within 180 days    Potassium  Date Value Ref Range Status  12/31/2022 4.4 3.5 - 5.3 mmol/L Final         Passed - Patient is not pregnant

## 2023-06-12 ENCOUNTER — Other Ambulatory Visit: Payer: Medicare Other

## 2023-06-12 DIAGNOSIS — E7800 Pure hypercholesterolemia, unspecified: Secondary | ICD-10-CM

## 2023-06-12 DIAGNOSIS — B1920 Unspecified viral hepatitis C without hepatic coma: Secondary | ICD-10-CM

## 2023-06-12 DIAGNOSIS — E039 Hypothyroidism, unspecified: Secondary | ICD-10-CM

## 2023-06-12 DIAGNOSIS — I10 Essential (primary) hypertension: Secondary | ICD-10-CM

## 2023-06-13 LAB — LIPID PANEL
Cholesterol: 199 mg/dL (ref <200)
HDL: 55 mg/dL (ref >=50)
LDL Cholesterol (Calc): 123 mg/dL — ABNORMAL HIGH
Non-HDL Cholesterol (Calc): 144 mg/dL — ABNORMAL HIGH (ref <130)
Total CHOL/HDL Ratio: 3.6 (calc) (ref <5.0)
Triglycerides: 103 mg/dL (ref <150)

## 2023-06-13 LAB — CBC WITH DIFFERENTIAL/PLATELET
Absolute Monocytes: 420 cells/uL (ref 200–950)
Basophils Absolute: 29 {cells}/uL (ref 0–200)
Basophils Relative: 0.7 %
Eosinophils Absolute: 109 {cells}/uL (ref 15–500)
Eosinophils Relative: 2.6 %
HCT: 40 % (ref 35.0–45.0)
Hemoglobin: 13.1 g/dL (ref 11.7–15.5)
Lymphs Abs: 1499 cells/uL (ref 850–3900)
MCH: 30.7 pg (ref 27.0–33.0)
MCHC: 32.8 g/dL (ref 32.0–36.0)
MCV: 93.7 fL (ref 80.0–100.0)
MPV: 10.2 fL (ref 7.5–12.5)
Monocytes Relative: 10 %
Neutro Abs: 2142 {cells}/uL (ref 1500–7800)
Neutrophils Relative %: 51 %
Platelets: 287 10*3/uL (ref 140–400)
RBC: 4.27 10*6/uL (ref 3.80–5.10)
RDW: 12.6 % (ref 11.0–15.0)
Total Lymphocyte: 35.7 %
WBC: 4.2 10*3/uL (ref 3.8–10.8)

## 2023-06-13 LAB — COMPLETE METABOLIC PANEL WITH GFR
AG Ratio: 1.8 (calc) (ref 1.0–2.5)
ALT: 19 U/L (ref 6–29)
AST: 16 U/L (ref 10–35)
Albumin: 4.2 g/dL (ref 3.6–5.1)
Alkaline phosphatase (APISO): 81 U/L (ref 37–153)
BUN: 14 mg/dL (ref 7–25)
CO2: 25 mmol/L (ref 20–32)
Calcium: 9.4 mg/dL (ref 8.6–10.4)
Chloride: 107 mmol/L (ref 98–110)
Creat: 0.67 mg/dL (ref 0.50–1.05)
Globulin: 2.3 g/dL (ref 1.9–3.7)
Glucose, Bld: 83 mg/dL (ref 65–99)
Potassium: 4.3 mmol/L (ref 3.5–5.3)
Sodium: 141 mmol/L (ref 135–146)
Total Bilirubin: 0.6 mg/dL (ref 0.2–1.2)
Total Protein: 6.5 g/dL (ref 6.1–8.1)
eGFR: 96 mL/min/{1.73_m2} (ref >=60)

## 2023-06-13 LAB — TSH: TSH: 1.28 m[IU]/L (ref 0.40–4.50)

## 2023-06-18 ENCOUNTER — Ambulatory Visit (INDEPENDENT_AMBULATORY_CARE_PROVIDER_SITE_OTHER): Payer: Medicare Other | Admitting: Family Medicine

## 2023-06-18 VITALS — BP 130/84 | HR 61 | Temp 98.2°F | Ht 62.0 in | Wt 164.0 lb

## 2023-06-18 DIAGNOSIS — E039 Hypothyroidism, unspecified: Secondary | ICD-10-CM | POA: Diagnosis not present

## 2023-06-18 DIAGNOSIS — M7061 Trochanteric bursitis, right hip: Secondary | ICD-10-CM

## 2023-06-18 DIAGNOSIS — I10 Essential (primary) hypertension: Secondary | ICD-10-CM | POA: Diagnosis not present

## 2023-06-18 DIAGNOSIS — E7800 Pure hypercholesterolemia, unspecified: Secondary | ICD-10-CM | POA: Diagnosis not present

## 2023-06-18 DIAGNOSIS — Z0001 Encounter for general adult medical examination with abnormal findings: Secondary | ICD-10-CM | POA: Diagnosis not present

## 2023-06-18 DIAGNOSIS — Z0000 Encounter for general adult medical examination without abnormal findings: Secondary | ICD-10-CM

## 2023-06-18 MED ORDER — LEVOCETIRIZINE DIHYDROCHLORIDE 5 MG PO TABS: 5.0000 mg | ORAL_TABLET | Freq: Every evening | ORAL | 11 refills | Status: DC

## 2023-06-18 NOTE — Progress Notes (Signed)
Subjective:    Patient ID: Yvonne Wu, female    DOB: [birth year], 67 y.o.   MRN: [id number]  HPI Patient is here today for a physical exam.  However she also complains of right lateral hip pain.  She has tenderness to palpation of the right greater trochanteric bursa.  She states that it hurts at night when she tries to lay on her side.  Her blood pressure today is excellent.  She denies any chest pain shortness of breath or dyspnea on exertion.  However her most recent lab work does show an elevated cholesterol.  She is not currently on statin.  Her TSH is excellent and showed adequate dosage of her levothyroxine.  Reviewed the last bloodwork.  Lab on 06/12/2023  Component Date Value Ref Range Status   WBC 06/12/2023 4.2  3.8 - 10.8 Thousand/uL Final   RBC 06/12/2023 4.27  3.80 - 5.10 Million/uL Final   Hemoglobin 06/12/2023 13.1  11.7 - 15.5 g/dL Final   HCT 95/62/1308 40.0  35.0 - 45.0 % Final   MCV 06/12/2023 93.7  80.0 - 100.0 fL Final   MCH 06/12/2023 30.7  27.0 - 33.0 pg Final   MCHC 06/12/2023 32.8  32.0 - 36.0 g/dL Final   RDW 65/78/4696 12.6  11.0 - 15.0 % Final   Platelets 06/12/2023 287  140 - 400 Thousand/uL Final   MPV 06/12/2023 10.2  7.5 - 12.5 fL Final   Neutro Abs 06/12/2023 2,142  1,500 - 7,800 cells/uL Final   Lymphs Abs 06/12/2023 1,499  850 - 3,900 cells/uL Final   Absolute Monocytes 06/12/2023 420  200 - 950 cells/uL Final   Eosinophils Absolute 06/12/2023 109  15 - 500 cells/uL Final   Basophils Absolute 06/12/2023 29  0 - 200 cells/uL Final   Neutrophils Relative % 06/12/2023 51  % Final   Total Lymphocyte 06/12/2023 35.7  % Final   Monocytes Relative 06/12/2023 10.0  % Final   Eosinophils Relative 06/12/2023 2.6  % Final   Basophils Relative 06/12/2023 0.7  % Final   Glucose, Bld 06/12/2023 83  65 - 99 mg/dL Final   Comment: .            Fasting reference interval .    BUN 06/12/2023 14  7 - 25 mg/dL Final   Creat 29/52/8413 0.67  0.50 - 1.05 mg/dL  Final   eGFR 24/40/1027 96  > OR = 60 mL/min/1.65m2 Final   BUN/Creatinine Ratio 06/12/2023 SEE NOTE:  6 - 22 (calc) Final   Comment:    Not Reported: BUN and Creatinine are within    reference range. .    Sodium 06/12/2023 141  135 - 146 mmol/L Final   Potassium 06/12/2023 4.3  3.5 - 5.3 mmol/L Final   Chloride 06/12/2023 107  98 - 110 mmol/L Final   CO2 06/12/2023 25  20 - 32 mmol/L Final   Calcium 06/12/2023 9.4  8.6 - 10.4 mg/dL Final   Total Protein 25/36/6440 6.5  6.1 - 8.1 g/dL Final   Albumin 34/74/2595 4.2  3.6 - 5.1 g/dL Final   Globulin 63/87/5643 2.3  1.9 - 3.7 g/dL (calc) Final   AG Ratio 06/12/2023 1.8  1.0 - 2.5 (calc) Final   Total Bilirubin 06/12/2023 0.6  0.2 - 1.2 mg/dL Final   Alkaline phosphatase (APISO) 06/12/2023 81  37 - 153 U/L Final   AST 06/12/2023 16  10 - 35 U/L Final   ALT 06/12/2023 19  6 - 29  U/L Final   Cholesterol 06/12/2023 199  <200 mg/dL Final   HDL 65/78/4696 55  > OR = 50 mg/dL Final   Triglycerides 29/52/8413 103  <150 mg/dL Final   LDL Cholesterol (Calc) 06/12/2023 123 (H)  mg/dL (calc) Final   Comment: Reference range: <100 . Desirable range <100 mg/dL for primary prevention;   <70 mg/dL for patients with CHD or diabetic patients  with > or = 2 CHD risk factors. Marland Kitchen LDL-C is now calculated using the Martin-Hopkins  calculation, which is a validated novel method providing  better accuracy than the Friedewald equation in the  estimation of LDL-C.  Horald Pollen et al. Lenox Ahr. 2440;102(72): 2061-2068  (http://education.[employer].com/faq/FAQ164)    Total CHOL/HDL Ratio 06/12/2023 3.6  <5.3 (calc) Final   Non-HDL Cholesterol (Calc) 06/12/2023 144 (H)  <130 mg/dL (calc) Final   Comment: For patients with diabetes plus 1 major ASCVD risk  factor, treating to a non-HDL-C goal of <100 mg/dL  (LDL-C of <66 mg/dL) is considered a therapeutic  option.    TSH 06/12/2023 1.28  0.40 - 4.50 mIU/L Final   Immunization History  Administered  Date(s) Administered   COVID-19, mRNA, vaccine(Comirnaty)12 years and older 09/29/2022   Influenza, High Dose Seasonal PF 09/08/2019   Influenza,inj,Quad PF,6+ Mos 07/09/2017, 07/08/2018, 07/20/2019   Influenza-Unspecified 07/20/2020, 09/26/2022   Moderna Sars-Covid-2 Vaccination 12/26/2019, 01/24/2020, 10/04/2020   PNEUMOCOCCAL CONJUGATE-20 10/28/2022   Tdap 10/02/2011   Tetanus 10/20/1997    Past Medical History:  Diagnosis Date   Anxiety    Depression    situational   Eczema    Hypertension    Hypothyroid    Urinary, incontinence, stress female 02/15/2015   Past Surgical History:  Procedure Laterality Date   BLADDER SUSPENSION N/A 02/15/2015   Procedure: TRANSVAGINAL TAPE (TVT) Mid-Urethral Sling PROCEDURE;  Surgeon: Shea Evans, MD;  Location: [facility];  Service: Gynecology;  Laterality: N/A;  2 hrs.   BUNIONECTOMY Left    CYSTOCELE REPAIR N/A 02/15/2015   Procedure: ANTERIOR REPAIR (CYSTOCELE)/Colporrhaphy;  Surgeon: Shea Evans, MD;  Location: [facility];  Service: Gynecology;  Laterality: N/A;   CYSTOSCOPY N/A 02/15/2015   Procedure: CYSTOSCOPY;  Surgeon: Shea Evans, MD;  Location: [facility];  Service: Gynecology;  Laterality: N/A;   TUBAL LIGATION     Current Outpatient Medications on File Prior to Visit  Medication Sig Dispense Refill   celecoxib (CELEBREX) 200 MG capsule TAKE (1) CAPSULE BY MOUTH TWICE DAILY. 180 capsule 0   levothyroxine (SYNTHROID) 75 MCG tablet Take 1 tablet (75 mcg total) by mouth daily before breakfast. 90 tablet 1   olmesartan (BENICAR) 20 MG tablet TAKE ONE TABLET BY MOUTH ONCE DAILY. 90 tablet 0   valACYclovir (VALTREX) 1000 MG tablet Take 1 tablet (1,000 mg total) by mouth 3 (three) times daily. 21 tablet 0   gabapentin (NEURONTIN) 100 MG capsule Take 1 capsule (100 mg total) by mouth 3 (three) times daily for 7 days. 21 capsule 0   No current facility-administered medications on file prior to visit.   Allergies  Allergen Reactions   Codeine      Per patient she gets real sick    Social History   Socioeconomic History   Marital status: Widowed    Spouse name: Not on file   Number of children: Not on file   Years of education: Not on file   Highest education level: Not on file  Occupational History   Not on file  Tobacco Use   Smoking status:  Former    Current packs/day: 0.00    Types: Cigarettes    Quit date: 02/22/1995    Years since quitting: 28.3   Smokeless tobacco: Never  Substance and Sexual Activity   Alcohol use: No   Drug use: No   Sexual activity: Not Currently  Other Topics Concern   Not on file  Social History Narrative   Works at [employer].   Involves a lot of standing and bending.   Husband passed away in 2014-04-14with leukemia.   Patient states that her daughter is age 27 (as of 38) and has bipolar. This daughter lives with her.   This daughter has a child who is 57 years old and another child he was 37 years old. They all live with the patient.   Patient also has a son who lives in [place].      Social Determinants of Health   Financial Resource Strain: Low Risk  (10/14/2022)   Overall Financial Resource Strain (CARDIA)    Difficulty of Paying Living Expenses: Not hard at all  Food Insecurity: No Food Insecurity (10/14/2022)   Hunger Vital Sign    Worried About Running Out of Food in the Last Year: Never true    Ran Out of Food in the Last Year: Never true  Transportation Needs: No Transportation Needs (10/14/2022)   PRAPARE - Administrator, Civil Service (Medical): No    Lack of Transportation (Non-Medical): No  Physical Activity: Inactive (10/14/2022)   Exercise Vital Sign    Days of Exercise per Week: 0 days    Minutes of Exercise per Session: 0 min  Stress: No Stress Concern Present (10/14/2022)   [employer] of Occupational Health - Occupational Stress Questionnaire    Feeling of Stress : Only a little  Social Connections: Moderately Integrated (10/14/2022)    Social Connection and Isolation Panel [NHANES]    Frequency of Communication with Friends and Family: More than three times a week    Frequency of Social Gatherings with Friends and Family: Three times a week    Attends Religious Services: More than 4 times per year    Active Member of Clubs or Organizations: Yes    Attends Banker Meetings: More than 4 times per year    Marital Status: Widowed  Intimate Partner Violence: Not At Risk (10/14/2022)   Humiliation, Afraid, Rape, and Kick questionnaire    Fear of Current or Ex-Partner: No    Emotionally Abused: No    Physically Abused: No    Sexually Abused: No    Review of Systems  All other systems reviewed and are negative.      Objective:   Physical Exam Vitals reviewed.  Constitutional:      General: She is not in acute distress.    Appearance: Normal appearance. She is normal weight. She is not ill-appearing, toxic-appearing or diaphoretic.  Cardiovascular:     Rate and Rhythm: Normal rate and regular rhythm.     Pulses: Normal pulses.     Heart sounds: Normal heart sounds. No murmur heard.    No gallop.  Pulmonary:     Effort: Pulmonary effort is normal. No respiratory distress.     Breath sounds: Normal breath sounds. No stridor. No wheezing, rhonchi or rales.  Chest:     Chest wall: No tenderness.  Abdominal:     General: Bowel sounds are normal. There is no distension.     Palpations: Abdomen is soft. There is  no mass.     Tenderness: There is no abdominal tenderness. There is no guarding or rebound.  Musculoskeletal:     Right hip: Tenderness and bony tenderness present. No crepitus. Normal range of motion. Normal strength.     Right lower leg: No edema.     Left lower leg: No edema.       Legs:  Skin:    Findings: No rash.  Neurological:     General: No focal deficit present.     Mental Status: She is alert and oriented to person, place, and time. Mental status is at baseline.     Cranial Nerves:  No cranial nerve deficit.     Motor: No weakness.     Gait: Gait normal.  Psychiatric:        Mood and Affect: Mood normal.        Thought Content: Thought content normal.        Judgment: Judgment normal.           Assessment & Plan:  Benign essential HTN - Plan: CT CARDIAC SCORING (SELF PAY ONLY)  Pure hypercholesterolemia  Hypothyroidism, unspecified type  Greater trochanteric bursitis of right hip  General medical exam I am very happy with her blood work except for her cholesterol.  Is borderline.  The patient would like to get a coronary artery calcium score to risk stratify herself to determine if she would benefit from statin.  Blood pressure is excellent and TSH is normal.  Immunizations are up-to-date.  Patient would like to get a cortisone shot in her right hip.  Using sterile technique, I injected the right greater trochanteric bursa with 1 cc of lidocaine and 1 cc of 40 g of Kenalog.  She tolerated seizure without complication.

## 2023-06-23 MED ORDER — BUPIVACAINE HCL 0.25 % IJ SOLN
1.0000 mL | Freq: Once | INTRAMUSCULAR | Status: AC
Start: 2023-06-23 — End: 2023-06-18
  Administered 2023-06-18: 1 mL via INTRA_ARTICULAR

## 2023-06-23 MED ORDER — LIDOCAINE HCL (PF) 1 % IJ SOLN
1.0000 mL | Freq: Once | INTRAMUSCULAR | Status: AC
Start: 2023-06-23 — End: 2023-06-18
  Administered 2023-06-18: 1 mL

## 2023-06-23 MED ORDER — TRIAMCINOLONE ACETONIDE 40 MG/ML IJ SUSP
40.0000 mg | Freq: Once | INTRAMUSCULAR | Status: AC
Start: 2023-06-23 — End: 2023-06-18
  Administered 2023-06-18: 40 mg via INTRA_ARTICULAR

## 2023-06-23 NOTE — Addendum Note (Signed)
Addended by: Venia Carbon K on: 06/23/2023 05:11 PM   Modules accepted: Orders

## 2023-07-28 ENCOUNTER — Other Ambulatory Visit: Payer: Self-pay | Admitting: Family Medicine

## 2023-07-28 DIAGNOSIS — M545 Low back pain, unspecified: Secondary | ICD-10-CM

## 2023-07-28 NOTE — Telephone Encounter (Signed)
Requested Prescriptions  Pending Prescriptions Disp Refills   celecoxib (CELEBREX) 200 MG capsule [Pharmacy Med Name: celecoxib 200 mg capsule] 180 capsule 0    Sig: TAKE (1) CAPSULE BY MOUTH TWICE DAILY.     Analgesics:  COX2 Inhibitors Failed - 07/28/2023  9:58 AM      Failed - Manual Review: Labs are only required if the patient has taken medication for more than 8 weeks.      Failed - Valid encounter within last 12 months    Recent Outpatient Visits           1 year ago Hypothyroidism, unspecified type   [facility] Pickard, Priscille Heidelberg, MD   2 years ago Sore throat   [facility] Tanya Nones, Priscille Heidelberg, MD   2 years ago Rhinosinusitis   [facility] Tanya Nones, Priscille Heidelberg, MD   3 years ago Pure hypercholesterolemia   [facility] Tanya Nones, Priscille Heidelberg, MD   4 years ago Essential hypertension   [facility] Pickard, Priscille Heidelberg, MD              Passed - HGB in normal range and within 360 days    Hemoglobin  Date Value Ref Range Status  06/12/2023 13.1 11.7 - 15.5 g/dL Final         Passed - Cr in normal range and within 360 days    Creat  Date Value Ref Range Status  06/12/2023 0.67 0.50 - 1.05 mg/dL Final         Passed - HCT in normal range and within 360 days    HCT  Date Value Ref Range Status  06/12/2023 40.0 35.0 - 45.0 % Final         Passed - AST in normal range and within 360 days    AST  Date Value Ref Range Status  06/12/2023 16 10 - 35 U/L Final         Passed - ALT in normal range and within 360 days    ALT  Date Value Ref Range Status  06/12/2023 19 6 - 29 U/L Final         Passed - eGFR is 30 or above and within 360 days    GFR, Est African American  Date Value Ref Range Status  04/02/2020 105 > OR = 60 mL/min/1.68m2 Final   GFR, Est Non African American  Date Value Ref Range Status  04/02/2020 91 > OR = 60 mL/min/1.29m2 Final   eGFR  Date Value Ref Range Status   06/12/2023 96 > OR = 60 mL/min/1.26m2 Final         Passed - Patient is not pregnant

## 2023-07-29 ENCOUNTER — Ambulatory Visit (HOSPITAL_COMMUNITY)
Admission: RE | Admit: 2023-07-29 | Discharge: 2023-07-29 | Disposition: A | Discharge status: Home / Self Care | Payer: Medicare Other | Source: Ambulatory Visit | Attending: Family Medicine | Admitting: Family Medicine

## 2023-07-29 DIAGNOSIS — I10 Essential (primary) hypertension: Secondary | ICD-10-CM

## 2023-09-12 ENCOUNTER — Other Ambulatory Visit: Payer: Self-pay | Admitting: Family Medicine

## 2023-09-14 ENCOUNTER — Other Ambulatory Visit: Payer: Self-pay

## 2023-09-14 NOTE — Telephone Encounter (Signed)
Requested Prescriptions  Pending Prescriptions Disp Refills   SYNTHROID 75 MCG tablet [Pharmacy Med Name: Synthroid 75 mcg tablet] 90 tablet 1    Sig: Take 1 tablet (75 mcg total) by mouth daily before breakfast.     Endocrinology:  Hypothyroid Agents Failed - 09/12/2023  9:45 AM      Failed - Valid encounter within last 12 months    Recent Outpatient Visits           1 year ago Hypothyroidism, unspecified type   [facility] Donita Brooks, MD   2 years ago Sore throat   [facility] Tanya Nones Priscille Heidelberg, MD   2 years ago Rhinosinusitis   [facility] Tanya Nones, Priscille Heidelberg, MD   3 years ago Pure hypercholesterolemia   [facility] Pickard, Priscille Heidelberg, MD   4 years ago Essential hypertension   [facility] Pickard, Priscille Heidelberg, MD              Passed - TSH in normal range and within 360 days    TSH  Date Value Ref Range Status  06/12/2023 1.28 0.40 - 4.50 mIU/L Final

## 2023-09-14 NOTE — Telephone Encounter (Unsigned)
Copied from CRM [id number]. Topic: Clinical - Medication Refill >> Sep 14, 2023  1:24 PM Hector Shade B wrote: Most Recent Primary Care Visit:  Provider: Lynnea Ferrier T  Department: [facility]  Visit Type: PHYSICAL  Date: 06/18/2023  Medication:  levothyroxine (SYNTHROID) 75 MCG tablet   Has the patient contacted their pharmacy? Yes (Agent: If no, request that the patient contact the pharmacy for the refill. If patient does not wish to contact the pharmacy document the reason why and proceed with request.) (Agent: If yes, when and what did the pharmacy advise?)  Is this the correct pharmacy for this prescription? Yes If no, delete pharmacy and type the correct one.  This is the patient's preferred pharmacy:  [facility] [place], [place] - [id number] [place] [address] Phone: [phone] Fax: [phone]  [facility] [phone] - [place], [place] - [address] AT [facility] [address] [place] [address] Phone: [phone] Fax: [phone]   Has the prescription been filled recently? Yes  Is the patient out of the medication? Yes  Has the patient been seen for an appointment in the last year OR does the patient have an upcoming appointment? Yes  Can we respond through MyChart? No  Agent: Please be advised that Rx refills may take up to 3 business days. We ask that you follow-up with your pharmacy.

## 2023-09-15 MED ORDER — LEVOTHYROXINE SODIUM 75 MCG PO TABS: 75.0000 ug | ORAL_TABLET | Freq: Every day | ORAL | 2 refills | Status: AC

## 2023-09-15 NOTE — Telephone Encounter (Signed)
Requested Prescriptions  Pending Prescriptions Disp Refills   levothyroxine (SYNTHROID) 75 MCG tablet 90 tablet 2    Sig: Take 1 tablet (75 mcg total) by mouth daily before breakfast.     Endocrinology:  Hypothyroid Agents Failed - 09/14/2023  1:33 PM      Failed - Valid encounter within last 12 months    Recent Outpatient Visits           1 year ago Hypothyroidism, unspecified type   [facility] Donita Brooks, MD   2 years ago Sore throat   [facility] Tanya Nones, Priscille Heidelberg, MD   2 years ago Rhinosinusitis   [facility] Tanya Nones, Priscille Heidelberg, MD   3 years ago Pure hypercholesterolemia   [facility] Pickard, Priscille Heidelberg, MD   4 years ago Essential hypertension   [facility] Pickard, Priscille Heidelberg, MD              Passed - TSH in normal range and within 360 days    TSH  Date Value Ref Range Status  06/12/2023 1.28 0.40 - 4.50 mIU/L Final

## 2023-09-23 ENCOUNTER — Other Ambulatory Visit: Payer: Self-pay | Admitting: Family Medicine

## 2023-09-25 ENCOUNTER — Other Ambulatory Visit: Payer: Self-pay | Admitting: Family Medicine

## 2023-09-25 ENCOUNTER — Other Ambulatory Visit: Payer: Self-pay

## 2023-09-25 ENCOUNTER — Telehealth: Payer: Self-pay

## 2023-09-25 DIAGNOSIS — I10 Essential (primary) hypertension: Secondary | ICD-10-CM

## 2023-09-25 MED ORDER — OLMESARTAN MEDOXOMIL 20 MG PO TABS
20.0000 mg | ORAL_TABLET | Freq: Every day | ORAL | 0 refills | Status: DC
Start: 2023-09-25 — End: 2023-12-29

## 2023-09-25 NOTE — Telephone Encounter (Signed)
Copied from CRM [id number]. Topic: Clinical - Medication Refill >> Sep 25, 2023  9:29 AM Maxwell Marion wrote: Most Recent Primary Care Visit:  Provider: Lynnea Ferrier T  Department: [facility]  Visit Type: PHYSICAL  Date: 06/18/2023  Medication: olmesartan (BENICAR) 20 MG tablet  Has the patient contacted their pharmacy? Yes, pharmacy sent over script 2 days ago  (Agent: If no, request that the patient contact the pharmacy for the refill. If patient does not wish to contact the pharmacy document the reason why and proceed with request.) (Agent: If yes, when and what did the pharmacy advise?)  Is this the correct pharmacy for this prescription? Yes If no, delete pharmacy and type the correct one.  This is the patient's preferred pharmacy:  [facility] [place], [place] - [id number] [place] [address] Phone: [phone] Fax: [phone]    Has the prescription been filled recently? No  Is the patient out of the medication?   Has the patient been seen for an appointment in the last year OR does the patient have an upcoming appointment?   Can we respond through MyChart?   Agent: Please be advised that Rx refills may take up to 3 business days. We ask that you follow-up with your pharmacy.

## 2023-09-25 NOTE — Telephone Encounter (Signed)
Copied from CRM [id number]. Topic: Clinical - Medication Question >> Sep 25, 2023  9:42 AM Alcus Dad H wrote: Reason for CRM: Pt called in regarding prescription for olmesartan (BENICAR) 20 MG tablet, she has been out for 2 days. Looks like it is pending doctor's signature

## 2023-09-25 NOTE — Telephone Encounter (Signed)
Rx was sent to pharmacy by Renee Pain, CMA  Requested Prescriptions  Pending Prescriptions Disp Refills   olmesartan (BENICAR) 20 MG tablet [Pharmacy Med Name: olmesartan 20 mg tablet] 90 tablet 0    Sig: TAKE ONE TABLET BY MOUTH ONCE DAILY.     Cardiovascular:  Angiotensin Receptor Blockers Failed - 09/23/2023 10:38 AM      Failed - Valid encounter within last 6 months    Recent Outpatient Visits           1 year ago Hypothyroidism, unspecified type   [facility] Pickard, Priscille Heidelberg, MD   2 years ago Sore throat   [facility] Tanya Nones, Priscille Heidelberg, MD   2 years ago Rhinosinusitis   [facility] Donita Brooks, MD   3 years ago Pure hypercholesterolemia   [facility] Tanya Nones, Priscille Heidelberg, MD   4 years ago Essential hypertension   [facility] Pickard, Priscille Heidelberg, MD              Passed - Cr in normal range and within 180 days    Creat  Date Value Ref Range Status  06/12/2023 0.67 0.50 - 1.05 mg/dL Final         Passed - K in normal range and within 180 days    Potassium  Date Value Ref Range Status  06/12/2023 4.3 3.5 - 5.3 mmol/L Final         Passed - Patient is not pregnant      Passed - Last BP in normal range    BP Readings from Last 1 Encounters:  06/18/23 130/84

## 2023-10-22 ENCOUNTER — Ambulatory Visit: Payer: Medicare Other | Admitting: *Deleted

## 2023-10-22 DIAGNOSIS — Z0000 Encounter for general adult medical examination without abnormal findings: Secondary | ICD-10-CM | POA: Diagnosis not present

## 2023-10-22 NOTE — Progress Notes (Signed)
 Subjective:   Yvonne Wu is a 68 y.o. female who presents for Medicare Annual (Subsequent) preventive examination.  Visit Complete: Virtual I connected with  Yvonne Wu on 10/22/23 by a video and audio enabled telemedicine application and verified that I am speaking with the correct person using two identifiers.  Patient Location: Home  Provider Location: Home Office  I discussed the limitations of evaluation and management by telemedicine. The patient expressed understanding and agreed to proceed.  Vital Signs: Because this visit was a virtual/telehealth visit, some criteria may be missing or patient reported. Any vitals not documented were not able to be obtained and vitals that have been documented are patient reported.   Cardiac Risk Factors include: advanced age (>48men, >39 women)     Objective:    There were no vitals filed for this visit. There is no height or weight on file to calculate BMI.     10/22/2023   11:42 AM 10/14/2022    1:19 PM 02/13/2015    1:46 PM  Advanced Directives  Does Patient Have a Medical Advance Directive? No No   Type of Advance Directive   Healthcare Power of Attorney  Does patient want to make changes to medical advance directive?  Yes (MAU/Ambulatory/Procedural Areas - Information given)   Copy of Healthcare Power of Attorney in Chart?   No - copy requested  Would patient like information on creating a medical advance directive? No - Patient declined      Current Medications (verified) Outpatient Encounter Medications as of 10/22/2023  Medication Sig   celecoxib  (CELEBREX ) 200 MG capsule TAKE (1) CAPSULE BY MOUTH TWICE DAILY.   levocetirizine (XYZAL  ALLERGY 24HR) 5 MG tablet Take 1 tablet (5 mg total) by mouth every evening.   levothyroxine  (SYNTHROID ) 75 MCG tablet Take 1 tablet (75 mcg total) by mouth daily before breakfast.   olmesartan  (BENICAR ) 20 MG tablet Take 1 tablet (20 mg total) by mouth daily.   SYNTHROID  75 MCG tablet Take  1 tablet (75 mcg total) by mouth daily before breakfast.   gabapentin  (NEURONTIN ) 100 MG capsule Take 1 capsule (100 mg total) by mouth 3 (three) times daily for 7 days.   valACYclovir  (VALTREX ) 1000 MG tablet Take 1 tablet (1,000 mg total) by mouth 3 (three) times daily. (Patient not taking: Reported on 10/22/2023)   No facility-administered encounter medications on file as of 10/22/2023.    Allergies (verified) Codeine   History: Past Medical History:  Diagnosis Date   Anxiety    Depression    situational   Eczema    Hypertension    Hypothyroid    Urinary, incontinence, stress female 02/15/2015   Past Surgical History:  Procedure Laterality Date   BLADDER SUSPENSION N/A 02/15/2015   Procedure: TRANSVAGINAL TAPE (TVT) Mid-Urethral Sling PROCEDURE;  Surgeon: Robbi Render, MD;  Location: [facility];  Service: Gynecology;  Laterality: N/A;  2 hrs.   BUNIONECTOMY Left    CYSTOCELE REPAIR N/A 02/15/2015   Procedure: ANTERIOR REPAIR (CYSTOCELE)/Colporrhaphy;  Surgeon: Robbi Render, MD;  Location: [facility];  Service: Gynecology;  Laterality: N/A;   CYSTOSCOPY N/A 02/15/2015   Procedure: CYSTOSCOPY;  Surgeon: Robbi Render, MD;  Location: [facility];  Service: Gynecology;  Laterality: N/A;   TUBAL LIGATION     Family History  Problem Relation Age of Onset   Asthma Mother    Mental illness Mother    Cancer Father 2       lung cancer   Social History  Socioeconomic History   Marital status: Widowed    Spouse name: Not on file   Number of children: Not on file   Years of education: Not on file   Highest education level: Not on file  Occupational History   Not on file  Tobacco Use   Smoking status: Former    Current packs/day: 0.00    Types: Cigarettes    Quit date: 02/22/1995    Years since quitting: 28.6   Smokeless tobacco: Never  Substance and Sexual Activity   Alcohol use: No   Drug use: No   Sexual activity: Not Currently  Other Topics Concern   Not on file  Social History  Narrative   Works at [employer].   Involves a lot of standing and bending.   Husband passed away in 04-24-14with leukemia.   Patient states that her daughter is age 2 (as of 58) and has bipolar. This daughter lives with her.   This daughter has a child who is 23 years old and another child he was 59 years old. They all live with the patient.   Patient also has a son who lives in [place].      Social Drivers of Health   Financial Resource Strain: Low Risk  (10/22/2023)   Overall Financial Resource Strain (CARDIA)    Difficulty of Paying Living Expenses: Not hard at all  Food Insecurity: No Food Insecurity (10/22/2023)   Hunger Vital Sign    Worried About Running Out of Food in the Last Year: Never true    Ran Out of Food in the Last Year: Never true  Transportation Needs: No Transportation Needs (10/22/2023)   PRAPARE - Administrator, Civil Service (Medical): No    Lack of Transportation (Non-Medical): No  Physical Activity: Insufficiently Active (10/22/2023)   Exercise Vital Sign    Days of Exercise per Week: 3 days    Minutes of Exercise per Session: 40 min  Stress: Stress Concern Present (10/22/2023)   [employer] of Occupational Health - Occupational Stress Questionnaire    Feeling of Stress : To some extent  Social Connections: Socially Isolated (10/22/2023)   Social Connection and Isolation Panel [NHANES]    Frequency of Communication with Friends and Family: Twice a week    Frequency of Social Gatherings with Friends and Family: Three times a week    Attends Religious Services: Never    Active Member of Clubs or Organizations: No    Attends Banker Meetings: Never    Marital Status: Widowed    Tobacco Counseling Counseling given: Not Answered   Clinical Intake:  Pre-visit preparation completed: Yes  Pain : No/denies pain     Diabetes: No     Interpreter Needed?: No  Information entered by :: Mliss Graff LPN   Activities of Daily  Living    10/22/2023   11:43 AM  In your present state of health, do you have any difficulty performing the following activities:  Hearing? 0  Vision? 0  Difficulty concentrating or making decisions? 0  Walking or climbing stairs? 0  Dressing or bathing? 0  Doing errands, shopping? 0  Preparing Food and eating ? N  Using the Toilet? N  In the past six months, have you accidently leaked urine? N  Do you have problems with loss of bowel control? N  Managing your Medications? N  Managing your Finances? N  Housekeeping or managing your Housekeeping? N    Patient Care Team:  Duanne Butler DASEN, MD as PCP - General (Family Medicine) Porter Andrez SAUNDERS, PA-C as Physician Assistant (Dermatology) Stuart Norris, NP as Nurse Practitioner (Obstetrics and Gynecology)  Indicate any recent Medical Services you may have received from other than [facility] providers in the past year (date may be approximate).     Assessment:   This is a routine wellness examination for Yvonne Wu.  Hearing/Vision screen Hearing Screening - Comments:: No trouble hearing Vision Screening - Comments:: Not [facility] to date [facility]   Goals Addressed             This Visit's Progress    Weight (lb) < 200 lb (90.7 kg)         Depression Screen    10/22/2023   11:45 AM 10/14/2022    1:18 PM 04/02/2020    9:29 AM 07/08/2018   11:37 AM 01/07/2018   11:40 AM 07/09/2017    9:02 AM  PHQ 2/9 Scores  PHQ - 2 Score 0 0 0 0 0 0  PHQ- 9 Score 1  0   0    Fall Risk    10/22/2023   11:39 AM 10/14/2022    1:17 PM 04/02/2020    9:28 AM  Fall Risk   Falls in the past year? 0 0 0  Number falls in past yr: 0 0 0  Injury with Fall? 0 0 0  Risk for fall due to :  No Fall Risks   Follow up Falls evaluation completed;Education provided;Falls prevention discussed Falls evaluation completed;Education provided;Falls prevention discussed     MEDICARE RISK AT HOME: Medicare Risk at Home Any stairs in or around the home?: Yes If  so, are there any without handrails?: No Home free of loose throw rugs in walkways, pet beds, electrical cords, etc?: Yes Adequate lighting in your home to reduce risk of falls?: Yes Life alert?: No Use of a cane, walker or w/c?: No Grab bars in the bathroom?: No Shower chair or bench in shower?: No Elevated toilet seat or a handicapped toilet?: No  TIMED UP AND GO:  Was the test performed?  No    Cognitive Function:        10/22/2023   11:43 AM 10/14/2022    1:19 PM  6CIT Screen  What Year? 0 points 0 points  What month? 0 points 0 points  What time? 0 points 0 points  Count back from 20 0 points 0 points  Months in reverse 0 points 0 points  Repeat phrase 0 points 0 points  Total Score 0 points 0 points    Immunizations Immunization History  Administered Date(s) Administered   Influenza, High Dose Seasonal PF 09/08/2019   Influenza,inj,Quad PF,6+ Mos 07/09/2017, 07/08/2018, 07/20/2019   Influenza-Unspecified 07/20/2020, 09/26/2022   Moderna Sars-Covid-2 Vaccination 12/26/2019, 01/24/2020, 10/04/2020   PNEUMOCOCCAL CONJUGATE-20 10/28/2022   Pfizer(Comirnaty)Fall Seasonal Vaccine 12 years and older 09/29/2022   Tdap 10/02/2011   Tetanus 10/20/1997    TDAP status: Due, Education has been provided regarding the importance of this vaccine. Advised may receive this vaccine at local pharmacy or Health Dept. Aware to provide a copy of the vaccination record if obtained from local pharmacy or Health Dept. Verbalized acceptance and understanding.  Flu Vaccine status: Due, Education has been provided regarding the importance of this vaccine. Advised may receive this vaccine at local pharmacy or Health Dept. Aware to provide a copy of the vaccination record if obtained from local pharmacy or Health Dept. Verbalized acceptance and understanding.  Pneumococcal vaccine status: Up to date  Covid-19 vaccine status: Information provided on how to obtain vaccines.   Qualifies for  Shingles Vaccine?  Patient stated had done.     Unable to verify   Education provided   Zostavax completed      Shingrix Completed?: No.    Education has been provided regarding the importance of this vaccine. Patient has been advised to call insurance company to determine out of pocket expense if they have not yet received this vaccine. Advised may also receive vaccine at local pharmacy or Health Dept. Verbalized acceptance and understanding.  Screening Tests Health Maintenance  Topic Date Due   COVID-19 Vaccine (5 - 2024-25 season) 06/21/2023   Zoster Vaccines- Shingrix (1 of 2) 11/24/2023 (Originally 08/10/1975)   INFLUENZA VACCINE  01/18/2024 (Originally 05/21/2023)   MAMMOGRAM  04/08/2024   Medicare Annual Wellness (AWV)  10/21/2024   Colonoscopy  12/26/2028   Pneumonia Vaccine 70+ Years old  Completed   DEXA SCAN  Completed   Hepatitis C Screening  Completed   HPV VACCINES  Aged Out   DTaP/Tdap/Td  Discontinued    Health Maintenance  Health Maintenance Due  Topic Date Due   COVID-19 Vaccine (5 - 2024-25 season) 06/21/2023    Colorectal cancer screening: Type of screening: Colonoscopy. Completed 2020. Repeat every 10 years  Mammogram status: Completed  . Repeat every year  Bone Density status: Completed 23023. Results reflect: Bone density results: OSTEOPOROSIS. Repeat every 2 years.  Lung Cancer Screening: (Low Dose CT Chest recommended if Age 38-80 years, 20 pack-year currently smoking OR have quit w/in 15years.) does not qualify.   Lung Cancer Screening Referral:   Additional Screening:  Hepatitis C Screening: does not qualify; Completed 2024  Vision Screening: Recommended annual ophthalmology exams for early detection of glaucoma and other disorders of the eye. Is the patient up to date with their annual eye exam?  No  Who is the provider or what is the name of the office in which the patient attends annual eye exams? [facility] If pt is not established with a  provider, would they like to be referred to a provider to establish care? No .   Dental Screening: Recommended annual dental exams for proper oral hygiene    Community Resource Referral / Chronic Care Management: CRR required this visit?  No   CCM required this visit?  No     Plan:     I have personally reviewed and noted the following in the patient's chart:   Medical and social history Use of alcohol, tobacco or illicit drugs  Current medications and supplements including opioid prescriptions. Patient is not currently taking opioid prescriptions. Functional ability and status Nutritional status Physical activity Advanced directives List of other physicians Hospitalizations, surgeries, and ER visits in previous 12 months Vitals Screenings to include cognitive, depression, and falls Referrals and appointments  In addition, I have reviewed and discussed with patient certain preventive protocols, quality metrics, and best practice recommendations. A written personalized care plan for preventive services as well as general preventive health recommendations were provided to patient.     Mliss Graff, LPN   05/26/7973   After Visit Summary: (MyChart) Due to this being a telephonic visit, the after visit summary with patients personalized plan was offered to patient via MyChart   Nurse Notes:

## 2023-10-22 NOTE — Patient Instructions (Signed)
 [name] , Thank you for taking time to come for your Medicare Wellness Visit. I appreciate your ongoing commitment to your health goals. Please review the following plan we discussed and let me know if I can assist you in the future.   Screening recommendations/referrals: Colonoscopy: up to date Mammogram: up to date Bone Density: up to date Recommended yearly ophthalmology/optometry visit for glaucoma screening and checkup Recommended yearly dental visit for hygiene and checkup  Vaccinations: Influenza vaccine: Education provided Pneumococcal vaccine: Education provided Tdap vaccine: Education provided      Preventive Care 65 Years and Older, Female Preventive care refers to lifestyle choices and visits with your health care provider that can promote health and wellness. What does preventive care include? A yearly physical exam. This is also called an annual well check. Dental exams once or twice a year. Routine eye exams. Ask your health care provider how often you should have your eyes checked. Personal lifestyle choices, including: Daily care of your teeth and gums. Regular physical activity. Eating a healthy diet. Avoiding tobacco and drug use. Limiting alcohol use. Practicing safe sex. Taking low-dose aspirin every day. Taking vitamin and mineral supplements as recommended by your health care provider. What happens during an annual well check? The services and screenings done by your health care provider during your annual well check will depend on your age, overall health, lifestyle risk factors, and family history of disease. Counseling  Your health care provider may ask you questions about your: Alcohol use. Tobacco use. Drug use. Emotional well-being. Home and relationship well-being. Sexual activity. Eating habits. History of falls. Memory and ability to understand (cognition). Work and work astronomer. Reproductive health. Screening  You may have the  following tests or measurements: Height, weight, and BMI. Blood pressure. Lipid and cholesterol levels. These may be checked every 5 years, or more frequently if you are over 79 years old. Skin check. Lung cancer screening. You may have this screening every year starting at age 58 if you have a 30-pack-year history of smoking and currently smoke or have quit within the past 15 years. Fecal occult blood test (FOBT) of the stool. You may have this test every year starting at age 9. Flexible sigmoidoscopy or colonoscopy. You may have a sigmoidoscopy every 5 years or a colonoscopy every 10 years starting at age 45. Hepatitis C blood test. Hepatitis B blood test. Sexually transmitted disease (STD) testing. Diabetes screening. This is done by checking your blood sugar (glucose) after you have not eaten for a while (fasting). You may have this done every 1-3 years. Bone density scan. This is done to screen for osteoporosis. You may have this done starting at age 74. Mammogram. This may be done every 1-2 years. Talk to your health care provider about how often you should have regular mammograms. Talk with your health care provider about your test results, treatment options, and if necessary, the need for more tests. Vaccines  Your health care provider may recommend certain vaccines, such as: Influenza vaccine. This is recommended every year. Tetanus, diphtheria, and acellular pertussis (Tdap, Td) vaccine. You may need a Td booster every 10 years. Zoster vaccine. You may need this after age 14. Pneumococcal 13-valent conjugate (PCV13) vaccine. One dose is recommended after age 72. Pneumococcal polysaccharide (PPSV23) vaccine. One dose is recommended after age 68. Talk to your health care provider about which screenings and vaccines you need and how often you need them. This information is not intended to replace advice given  to you by your health care provider. Make sure you discuss any questions you  have with your health care provider. Document Released: 11/02/2015 Document Revised: 06/25/2016 Document Reviewed: 08/07/2015 Elsevier Interactive Patient Education  2017 [employer].  Fall Prevention in the Home Falls can cause injuries. They can happen to people of all ages. There are many things you can do to make your home safe and to help prevent falls. What can I do on the outside of my home? Regularly fix the edges of walkways and driveways and fix any cracks. Remove anything that might make you trip as you walk through a door, such as a raised step or threshold. Trim any bushes or trees on the path to your home. Use bright outdoor lighting. Clear any walking paths of anything that might make someone trip, such as rocks or tools. Regularly check to see if handrails are loose or broken. Make sure that both sides of any steps have handrails. Any raised decks and porches should have guardrails on the edges. Have any leaves, snow, or ice cleared regularly. Use sand or salt on walking paths during winter. Clean up any spills in your garage right away. This includes oil or grease spills. What can I do in the bathroom? Use night lights. Install grab bars by the toilet and in the tub and shower. Do not use towel bars as grab bars. Use non-skid mats or decals in the tub or shower. If you need to sit down in the shower, use a plastic, non-slip stool. Keep the floor dry. Clean up any water  that spills on the floor as soon as it happens. Remove soap buildup in the tub or shower regularly. Attach bath mats securely with double-sided non-slip rug tape. Do not have throw rugs and other things on the floor that can make you trip. What can I do in the bedroom? Use night lights. Make sure that you have a light by your bed that is easy to reach. Do not use any sheets or blankets that are too big for your bed. They should not hang down onto the floor. Have a firm chair that has side arms. You can  use this for support while you get dressed. Do not have throw rugs and other things on the floor that can make you trip. What can I do in the kitchen? Clean up any spills right away. Avoid walking on wet floors. Keep items that you use a lot in easy-to-reach places. If you need to reach something above you, use a strong step stool that has a grab bar. Keep electrical cords out of the way. Do not use floor polish or wax that makes floors slippery. If you must use wax, use non-skid floor wax. Do not have throw rugs and other things on the floor that can make you trip. What can I do with my stairs? Do not leave any items on the stairs. Make sure that there are handrails on both sides of the stairs and use them. Fix handrails that are broken or loose. Make sure that handrails are as long as the stairways. Check any carpeting to make sure that it is firmly attached to the stairs. Fix any carpet that is loose or worn. Avoid having throw rugs at the top or bottom of the stairs. If you do have throw rugs, attach them to the floor with carpet tape. Make sure that you have a light switch at the top of the stairs and the bottom of the  stairs. If you do not have them, ask someone to add them for you. What else can I do to help prevent falls? Wear shoes that: Do not have high heels. Have rubber bottoms. Are comfortable and fit you well. Are closed at the toe. Do not wear sandals. If you use a stepladder: Make sure that it is fully opened. Do not climb a closed stepladder. Make sure that both sides of the stepladder are locked into place. Ask someone to hold it for you, if possible. Clearly mark and make sure that you can see: Any grab bars or handrails. First and last steps. Where the edge of each step is. Use tools that help you move around (mobility aids) if they are needed. These include: Canes. Walkers. Scooters. Crutches. Turn on the lights when you go into a dark area. Replace any light  bulbs as soon as they burn out. Set up your furniture so you have a clear path. Avoid moving your furniture around. If any of your floors are uneven, fix them. If there are any pets around you, be aware of where they are. Review your medicines with your doctor. Some medicines can make you feel dizzy. This can increase your chance of falling. Ask your doctor what other things that you can do to help prevent falls. This information is not intended to replace advice given to you by your health care provider. Make sure you discuss any questions you have with your health care provider. Document Released: 08/02/2009 Document Revised: 03/13/2016 Document Reviewed: 11/10/2014 Elsevier Interactive Patient Education  2017 [employer].

## 2023-11-25 ENCOUNTER — Encounter (INDEPENDENT_AMBULATORY_CARE_PROVIDER_SITE_OTHER): Payer: Medicare Other | Admitting: Ophthalmology

## 2023-11-25 DIAGNOSIS — H43813 Vitreous degeneration, bilateral: Secondary | ICD-10-CM

## 2023-11-25 DIAGNOSIS — H35033 Hypertensive retinopathy, bilateral: Secondary | ICD-10-CM

## 2023-11-25 DIAGNOSIS — H35712 Central serous chorioretinopathy, left eye: Secondary | ICD-10-CM

## 2023-11-25 DIAGNOSIS — H353221 Exudative age-related macular degeneration, left eye, with active choroidal neovascularization: Secondary | ICD-10-CM

## 2023-11-25 DIAGNOSIS — I10 Essential (primary) hypertension: Secondary | ICD-10-CM | POA: Diagnosis not present

## 2023-11-25 DIAGNOSIS — H2513 Age-related nuclear cataract, bilateral: Secondary | ICD-10-CM

## 2023-11-26 ENCOUNTER — Ambulatory Visit: Payer: Self-pay | Admitting: Family Medicine

## 2023-11-26 NOTE — Telephone Encounter (Signed)
 FYI see CRM msg.

## 2023-11-26 NOTE — Telephone Encounter (Addendum)
 This RN made second attempt to triage. No answer. Left a message. Will route appropriately for more attempts.   This RN made first attempt to triage. No answer. Left a message. Will route appropriately for more attempts.   Copied from CRM [id number]. Topic: Appointments - Appointment Scheduling >> Nov 26, 2023  9:06 AM Cherylann RAMAN wrote: Patient/patient representative is calling to schedule an appointment. Refer to attachments for appointment information. Patient called in wanting an appointment with Dr. Duanne. Patient complained of having high blood pressure. In addition, patient is complaining of an on going Headache. Spoke with Delon in the front office; stated for patient to go to an urgent care or the emergency room. All appointments for today has been filled.

## 2023-11-26 NOTE — Telephone Encounter (Signed)
   Chief Complaint: high blood press Symptoms: dull headache  Frequency: constant  Disposition: [x] ED /[] Urgent Care (no appt availability in office) / [] Appointment(In office/virtual)/ []  [place] [facility]/ [] Home Care/ [] Refused Recommended Disposition /[] [facility]/ []  Follow-up with PCP Additional Notes: Pt  is returning call for a high blood pressure reading from yesterday. Reading was 198/118 while eye dr appt. Pt was diagnosed with macular degenerative disease.  Pt was told to follow up with PCP on BP. Pt stated she takes her Benicar  20mg  daily and doesn't miss a dose. Pt states she does not  have a way to check it at home. Pt states she currently has dull headed. Pt denies any SOB, chest pain, new vision changes  or weakness. Per protocol, pt advised to go to ED. Pt refused. Pt made urgent care appt for tomorrow instead. Pt is fearful for the flu. RN advised pt of dangers of high blood pressure readings and this doesn't need to wait until tomorrow. RN notified [facility] of refusal. Pt states she wants to see PCP, but RN following protocol and unable to schedule appt.          Copied from CRM [id number]. Topic: Appointments - Appointment Scheduling >> Nov 26, 2023  9:06 AM Cherylann RAMAN wrote: Patient/patient representative is calling to schedule an appointment. Refer to attachments for appointment information. Patient called in wanting an appointment with Dr. Duanne. Patient complained of having high blood pressure. In addition, patient is complaining of an on going Headache. Spoke with Delon in the front office; stated for patient to go to an urgent care or the emergency room. All appointments for today has been filled. Reason for Disposition  [1] Systolic BP  >= 160 OR Diastolic >= 100 AND [2] cardiac (e.g., breathing difficulty, chest pain) or neurologic symptoms (e.g., new-onset blurred or double vision, unsteady gait)  Answer Assessment - Initial Assessment Questions 1.  BLOOD PRESSURE: What is the blood pressure? Did you take at least two measurements 5 minutes apart?     198/118 2. ONSET: When did you take your blood pressure?     2/6 3. HOW: How did you take your blood pressure? (e.g., automatic home BP monitor, visiting nurse)     Manual  4. HISTORY: Do you have a history of high blood pressure?     History  5. MEDICINES: Are you taking any medicines for blood pressure? Have you missed any doses recently?     Benicar  20mg  daily  6. OTHER SYMPTOMS: Do you have any symptoms? (e.g., blurred vision, chest pain, difficulty breathing, headache, weakness)     Dull headache that comes and goes  Protocols used: Blood Pressure - High-A-AH

## 2023-11-27 ENCOUNTER — Ambulatory Visit: Payer: Self-pay

## 2023-12-04 ENCOUNTER — Encounter: Payer: Self-pay | Admitting: Family Medicine

## 2023-12-04 ENCOUNTER — Ambulatory Visit (INDEPENDENT_AMBULATORY_CARE_PROVIDER_SITE_OTHER): Payer: Medicare Other | Admitting: Family Medicine

## 2023-12-04 VITALS — BP 168/112 | HR 80 | Temp 97.8°F | Ht 62.0 in | Wt 178.6 lb

## 2023-12-04 DIAGNOSIS — Z23 Encounter for immunization: Secondary | ICD-10-CM

## 2023-12-04 DIAGNOSIS — J029 Acute pharyngitis, unspecified: Secondary | ICD-10-CM

## 2023-12-04 DIAGNOSIS — I10 Essential (primary) hypertension: Secondary | ICD-10-CM | POA: Diagnosis not present

## 2023-12-04 MED ORDER — HYDROCHLOROTHIAZIDE 25 MG PO TABS
25.0000 mg | ORAL_TABLET | Freq: Every day | ORAL | 3 refills | Status: AC
Start: 2023-12-04 — End: ?

## 2023-12-04 MED ORDER — CLONAZEPAM 0.5 MG PO TABS: 0.5000 mg | ORAL_TABLET | Freq: Two times a day (BID) | ORAL | 1 refills | Status: DC | PRN

## 2023-12-04 NOTE — Addendum Note (Signed)
Addended by: Venia Carbon K on: 12/04/2023 12:53 PM   Modules accepted: Orders

## 2023-12-04 NOTE — Progress Notes (Signed)
Subjective:    Patient ID: Yvonne Wu, female    DOB: 06/29/1956, 68 y.o.   MRN: [id number]  Sore Throat    Patient has a mild sore throat however her strep test is negative.  She is currently on Benicar 20 mg a day.  However her blood pressure has consistently been 150-160/100-110.  She denies any chest pain or shortness of breath.  However she does complain of increased anxiety.  She is essentially having to raise her grandchildren.  This causes a tremendous amount of stress on a daily basis.  She would like to have Klonopin.  She use Klonopin in the past.  She would like to have this medication again so that she can use the medicine sparingly on an as-needed basis when she feels overwhelmed.  This is primarily at night. Past Medical History:  Diagnosis Date   Anxiety    Depression    situational   Eczema    Hypertension    Hypothyroid    Urinary, incontinence, stress female 02/15/2015   Past Surgical History:  Procedure Laterality Date   BLADDER SUSPENSION N/A 02/15/2015   Procedure: TRANSVAGINAL TAPE (TVT) Mid-Urethral Sling PROCEDURE;  Surgeon: Shea Evans, MD;  Location: [facility];  Service: Gynecology;  Laterality: N/A;  2 hrs.   BUNIONECTOMY Left    CYSTOCELE REPAIR N/A 02/15/2015   Procedure: ANTERIOR REPAIR (CYSTOCELE)/Colporrhaphy;  Surgeon: Shea Evans, MD;  Location: [facility];  Service: Gynecology;  Laterality: N/A;   CYSTOSCOPY N/A 02/15/2015   Procedure: CYSTOSCOPY;  Surgeon: Shea Evans, MD;  Location: [facility];  Service: Gynecology;  Laterality: N/A;   TUBAL LIGATION     Current Outpatient Medications on File Prior to Visit  Medication Sig Dispense Refill   celecoxib (CELEBREX) 200 MG capsule TAKE (1) CAPSULE BY MOUTH TWICE DAILY. 180 capsule 0   gabapentin (NEURONTIN) 100 MG capsule Take 1 capsule (100 mg total) by mouth 3 (three) times daily for 7 days. 21 capsule 0   levocetirizine (XYZAL ALLERGY 24HR) 5 MG tablet Take 1 tablet (5 mg total) by mouth every  evening. 30 tablet 11   levothyroxine (SYNTHROID) 75 MCG tablet Take 1 tablet (75 mcg total) by mouth daily before breakfast. 90 tablet 2   olmesartan (BENICAR) 20 MG tablet Take 1 tablet (20 mg total) by mouth daily. 90 tablet 0   SYNTHROID 75 MCG tablet Take 1 tablet (75 mcg total) by mouth daily before breakfast. 90 tablet 2   valACYclovir (VALTREX) 1000 MG tablet Take 1 tablet (1,000 mg total) by mouth 3 (three) times daily. (Patient not taking: Reported on 10/22/2023) 21 tablet 0   No current facility-administered medications on file prior to visit.   Allergies  Allergen Reactions   Codeine     Per patient she gets real sick    Social History   Socioeconomic History   Marital status: Widowed    Spouse name: Not on file   Number of children: Not on file   Years of education: Not on file   Highest education level: Not on file  Occupational History   Not on file  Tobacco Use   Smoking status: Former    Current packs/day: 0.00    Types: Cigarettes    Quit date: 02/22/1995    Years since quitting: 28.8   Smokeless tobacco: Never  Substance and Sexual Activity   Alcohol use: No   Drug use: No   Sexual activity: Not Currently  Other Topics Concern   Not  on file  Social History Narrative   Works at [employer].   Involves a lot of standing and bending.   Husband passed away in 2014/04/22with leukemia.   Patient states that her daughter is age 99 (as of 90) and has bipolar. This daughter lives with her.   This daughter has a child who is 11 years old and another child he was 10 years old. They all live with the patient.   Patient also has a son who lives in [place].      Social Drivers of Health   Financial Resource Strain: Low Risk  (10/22/2023)   Overall Financial Resource Strain (CARDIA)    Difficulty of Paying Living Expenses: Not hard at all  Food Insecurity: No Food Insecurity (10/22/2023)   Hunger Vital Sign    Worried About Running Out of Food in the Last Year: Never  true    Ran Out of Food in the Last Year: Never true  Transportation Needs: No Transportation Needs (10/22/2023)   PRAPARE - Administrator, Civil Service (Medical): No    Lack of Transportation (Non-Medical): No  Physical Activity: Insufficiently Active (10/22/2023)   Exercise Vital Sign    Days of Exercise per Week: 3 days    Minutes of Exercise per Session: 40 min  Stress: Stress Concern Present (10/22/2023)   [employer] of Occupational Health - Occupational Stress Questionnaire    Feeling of Stress : To some extent  Social Connections: Socially Isolated (10/22/2023)   Social Connection and Isolation Panel [NHANES]    Frequency of Communication with Friends and Family: Twice a week    Frequency of Social Gatherings with Friends and Family: Three times a week    Attends Religious Services: Never    Active Member of Clubs or Organizations: No    Attends Banker Meetings: Never    Marital Status: Widowed  Intimate Partner Violence: Not At Risk (10/22/2023)   Humiliation, Afraid, Rape, and Kick questionnaire    Fear of Current or Ex-Partner: No    Emotionally Abused: No    Physically Abused: No    Sexually Abused: No    Review of Systems  All other systems reviewed and are negative.      Objective:   Physical Exam Vitals reviewed.  Constitutional:      General: She is not in acute distress.    Appearance: Normal appearance. She is normal weight. She is not ill-appearing, toxic-appearing or diaphoretic.  Cardiovascular:     Rate and Rhythm: Normal rate and regular rhythm.     Pulses: Normal pulses.     Heart sounds: Normal heart sounds. No murmur heard.    No gallop.  Pulmonary:     Effort: Pulmonary effort is normal. No respiratory distress.     Breath sounds: Normal breath sounds. No stridor. No wheezing, rhonchi or rales.  Chest:     Chest wall: No tenderness.  Abdominal:     General: Bowel sounds are normal. There is no distension.      Palpations: Abdomen is soft. There is no mass.     Tenderness: There is no abdominal tenderness. There is no guarding or rebound.  Musculoskeletal:     Right hip: Tenderness and bony tenderness present. No crepitus. Normal range of motion. Normal strength.     Right lower leg: No edema.     Left lower leg: No edema.       Legs:  Skin:    Findings:  No rash.  Neurological:     General: No focal deficit present.     Mental Status: She is alert and oriented to person, place, and time. Mental status is at baseline.     Cranial Nerves: No cranial nerve deficit.     Motor: No weakness.     Gait: Gait normal.  Psychiatric:        Mood and Affect: Mood normal.        Thought Content: Thought content normal.        Judgment: Judgment normal.           Assessment & Plan:  Sore throat - Plan: STREP GROUP A AG, W/REFLEX TO CULT  Benign essential HTN - Plan: BASIC METABOLIC PANEL WITH GFR Add hydrochlorothiazide 25 mg daily to Benicar 20 mg daily.  Check a BMP to monitor her renal function and potassium.  Recheck blood pressure in 2 weeks.  Strep test is negative.  Suspect viral upper respiratory infection.  Did give the patient a prescription for Klonopin 0.5 mg p.o. every 12 hours as needed anxiety.  I asked the patient to use this medication sparingly only as needed to avoid dependency and habituation

## 2023-12-05 LAB — BASIC METABOLIC PANEL WITH GFR
BUN: 9 mg/dL (ref 7–25)
CO2: 27 mmol/L (ref 20–32)
Calcium: 9.7 mg/dL (ref 8.6–10.4)
Chloride: 104 mmol/L (ref 98–110)
Creat: 0.69 mg/dL (ref 0.50–1.05)
Glucose, Bld: 80 mg/dL (ref 65–99)
Potassium: 3.9 mmol/L (ref 3.5–5.3)
Sodium: 141 mmol/L (ref 135–146)
eGFR: 95 mL/min/{1.73_m2} (ref >=60)

## 2023-12-06 LAB — CULTURE, GROUP A STREP
Micro Number: 16085977
SPECIMEN QUALITY:: ADEQUATE

## 2023-12-06 LAB — STREP GROUP A AG, W/REFLEX TO CULT: Streptococcus Group A AG: NOT DETECTED

## 2023-12-23 ENCOUNTER — Encounter (INDEPENDENT_AMBULATORY_CARE_PROVIDER_SITE_OTHER): Payer: Medicare Other | Admitting: Ophthalmology

## 2023-12-23 DIAGNOSIS — H35033 Hypertensive retinopathy, bilateral: Secondary | ICD-10-CM

## 2023-12-23 DIAGNOSIS — H353221 Exudative age-related macular degeneration, left eye, with active choroidal neovascularization: Secondary | ICD-10-CM

## 2023-12-23 DIAGNOSIS — H35712 Central serous chorioretinopathy, left eye: Secondary | ICD-10-CM

## 2023-12-23 DIAGNOSIS — H43813 Vitreous degeneration, bilateral: Secondary | ICD-10-CM

## 2023-12-23 DIAGNOSIS — I10 Essential (primary) hypertension: Secondary | ICD-10-CM | POA: Diagnosis not present

## 2023-12-24 NOTE — Telephone Encounter (Signed)
 Copied from CRM [id number]. Topic: General - Other >> Dec 24, 2023  2:11 PM Truddie Crumble wrote: Reason for CRM: patient called stating she was returning the nurse call and the office stated that the nurse has what she need and told the patient to disregard the phone call. The patient would like to know if she need to come back for her blood pressure

## 2023-12-24 NOTE — Telephone Encounter (Signed)
 Copied from CRM [id number]. Topic: Clinical - Lab/Test Results >> Dec 24, 2023  8:26 AM Patsy Lager T wrote: Reason for CRM: patient had labs done on 2/14 and want the results. She also said he BP was a little high at the dr office yesterday and wants to know if Dr Tanya Nones needs her to come back in for a check. Please f/u with patient

## 2023-12-28 ENCOUNTER — Other Ambulatory Visit: Payer: Self-pay | Admitting: Family Medicine

## 2023-12-28 DIAGNOSIS — I10 Essential (primary) hypertension: Secondary | ICD-10-CM

## 2023-12-29 NOTE — Telephone Encounter (Signed)
 Requested Prescriptions  Pending Prescriptions Disp Refills   olmesartan (BENICAR) 20 MG tablet [Pharmacy Med Name: olmesartan 20 mg tablet] 90 tablet 0    Sig: Take 1 tablet (20 mg total) by mouth daily.     Cardiovascular:  Angiotensin Receptor Blockers Failed - 12/29/2023  1:39 PM      Failed - Last BP in normal range    BP Readings from Last 1 Encounters:  12/04/23 (!) 168/112         Failed - Valid encounter within last 6 months    Recent Outpatient Visits           2 years ago Hypothyroidism, unspecified type   [facility] Tanya Nones, Priscille Heidelberg, MD   2 years ago Sore throat   [facility] Tanya Nones, Priscille Heidelberg, MD   3 years ago Rhinosinusitis   [facility] Tanya Nones, Priscille Heidelberg, MD   3 years ago Pure hypercholesterolemia   [facility] Tanya Nones, Priscille Heidelberg, MD   4 years ago Essential hypertension   [facility] Pickard, Priscille Heidelberg, MD              Passed - Cr in normal range and within 180 days    Creat  Date Value Ref Range Status  12/04/2023 0.69 0.50 - 1.05 mg/dL Final         Passed - K in normal range and within 180 days    Potassium  Date Value Ref Range Status  12/04/2023 3.9 3.5 - 5.3 mmol/L Final         Passed - Patient is not pregnant

## 2024-01-20 ENCOUNTER — Encounter (INDEPENDENT_AMBULATORY_CARE_PROVIDER_SITE_OTHER): Admitting: Ophthalmology

## 2024-01-20 DIAGNOSIS — I10 Essential (primary) hypertension: Secondary | ICD-10-CM | POA: Diagnosis not present

## 2024-01-20 DIAGNOSIS — H43813 Vitreous degeneration, bilateral: Secondary | ICD-10-CM | POA: Diagnosis not present

## 2024-01-20 DIAGNOSIS — H353221 Exudative age-related macular degeneration, left eye, with active choroidal neovascularization: Secondary | ICD-10-CM | POA: Diagnosis not present

## 2024-01-20 DIAGNOSIS — H35033 Hypertensive retinopathy, bilateral: Secondary | ICD-10-CM

## 2024-01-20 DIAGNOSIS — H2513 Age-related nuclear cataract, bilateral: Secondary | ICD-10-CM

## 2024-02-17 ENCOUNTER — Encounter (INDEPENDENT_AMBULATORY_CARE_PROVIDER_SITE_OTHER): Admitting: Ophthalmology

## 2024-02-17 DIAGNOSIS — H353221 Exudative age-related macular degeneration, left eye, with active choroidal neovascularization: Secondary | ICD-10-CM | POA: Diagnosis not present

## 2024-02-17 DIAGNOSIS — H35033 Hypertensive retinopathy, bilateral: Secondary | ICD-10-CM

## 2024-02-17 DIAGNOSIS — H353112 Nonexudative age-related macular degeneration, right eye, intermediate dry stage: Secondary | ICD-10-CM

## 2024-02-17 DIAGNOSIS — H43813 Vitreous degeneration, bilateral: Secondary | ICD-10-CM

## 2024-02-17 DIAGNOSIS — I10 Essential (primary) hypertension: Secondary | ICD-10-CM | POA: Diagnosis not present

## 2024-02-17 DIAGNOSIS — H35712 Central serous chorioretinopathy, left eye: Secondary | ICD-10-CM

## 2024-02-20 ENCOUNTER — Other Ambulatory Visit: Payer: Self-pay | Admitting: Family Medicine

## 2024-02-20 DIAGNOSIS — M545 Low back pain, unspecified: Secondary | ICD-10-CM

## 2024-03-16 ENCOUNTER — Encounter (INDEPENDENT_AMBULATORY_CARE_PROVIDER_SITE_OTHER): Admitting: Ophthalmology

## 2024-03-16 DIAGNOSIS — H35033 Hypertensive retinopathy, bilateral: Secondary | ICD-10-CM

## 2024-03-16 DIAGNOSIS — H353221 Exudative age-related macular degeneration, left eye, with active choroidal neovascularization: Secondary | ICD-10-CM | POA: Diagnosis not present

## 2024-03-16 DIAGNOSIS — H2513 Age-related nuclear cataract, bilateral: Secondary | ICD-10-CM

## 2024-03-16 DIAGNOSIS — H43813 Vitreous degeneration, bilateral: Secondary | ICD-10-CM

## 2024-03-16 DIAGNOSIS — H35712 Central serous chorioretinopathy, left eye: Secondary | ICD-10-CM | POA: Diagnosis not present

## 2024-03-16 DIAGNOSIS — I10 Essential (primary) hypertension: Secondary | ICD-10-CM | POA: Diagnosis not present

## 2024-03-28 ENCOUNTER — Other Ambulatory Visit: Payer: Self-pay | Admitting: Family Medicine

## 2024-03-28 DIAGNOSIS — J029 Acute pharyngitis, unspecified: Secondary | ICD-10-CM

## 2024-03-29 ENCOUNTER — Other Ambulatory Visit: Payer: Self-pay | Admitting: Family Medicine

## 2024-03-29 DIAGNOSIS — I10 Essential (primary) hypertension: Secondary | ICD-10-CM

## 2024-04-15 ENCOUNTER — Encounter (INDEPENDENT_AMBULATORY_CARE_PROVIDER_SITE_OTHER): Admitting: Ophthalmology

## 2024-04-15 DIAGNOSIS — H43813 Vitreous degeneration, bilateral: Secondary | ICD-10-CM

## 2024-04-15 DIAGNOSIS — H35712 Central serous chorioretinopathy, left eye: Secondary | ICD-10-CM | POA: Diagnosis not present

## 2024-04-15 DIAGNOSIS — H353221 Exudative age-related macular degeneration, left eye, with active choroidal neovascularization: Secondary | ICD-10-CM | POA: Diagnosis not present

## 2024-04-15 DIAGNOSIS — I10 Essential (primary) hypertension: Secondary | ICD-10-CM | POA: Diagnosis not present

## 2024-04-15 DIAGNOSIS — H35033 Hypertensive retinopathy, bilateral: Secondary | ICD-10-CM | POA: Diagnosis not present

## 2024-04-28 LAB — HM MAMMOGRAPHY

## 2024-05-13 ENCOUNTER — Encounter (INDEPENDENT_AMBULATORY_CARE_PROVIDER_SITE_OTHER): Admitting: Ophthalmology

## 2024-05-13 DIAGNOSIS — H43813 Vitreous degeneration, bilateral: Secondary | ICD-10-CM

## 2024-05-13 DIAGNOSIS — H35033 Hypertensive retinopathy, bilateral: Secondary | ICD-10-CM | POA: Diagnosis not present

## 2024-05-13 DIAGNOSIS — H35712 Central serous chorioretinopathy, left eye: Secondary | ICD-10-CM

## 2024-05-13 DIAGNOSIS — I10 Essential (primary) hypertension: Secondary | ICD-10-CM | POA: Diagnosis not present

## 2024-05-13 DIAGNOSIS — H353221 Exudative age-related macular degeneration, left eye, with active choroidal neovascularization: Secondary | ICD-10-CM

## 2024-06-03 ENCOUNTER — Other Ambulatory Visit: Payer: Self-pay | Admitting: Family Medicine

## 2024-06-06 NOTE — Telephone Encounter (Signed)
 Courtesy refill. Patient will need an office visit for additional refills.  Requested Prescriptions  Pending Prescriptions Disp Refills   SYNTHROID  75 MCG tablet [Pharmacy Med Name: Synthroid  75 mcg tablet] 30 tablet 0    Sig: Take 1 tablet (75 mcg total) by mouth daily before breakfast.     Endocrinology:  Hypothyroid Agents Passed - 06/06/2024  5:01 PM      Passed - TSH in normal range and within 360 days    TSH  Date Value Ref Range Status  06/12/2023 1.28 0.40 - 4.50 mIU/L Final         Passed - Valid encounter within last 12 months    Recent Outpatient Visits           6 months ago Sore throat   [facility] Duanne Butler DASEN, MD   11 months ago Benign essential HTN   [facility] Pickard, Butler DASEN, MD   1 year ago Hypothyroidism, unspecified type   [facility] Pickard, Butler DASEN, MD

## 2024-06-09 ENCOUNTER — Telehealth: Payer: Self-pay

## 2024-06-09 NOTE — Telephone Encounter (Signed)
 Pt came into office to get health examination check form completed by pcp. Pt completed an intake form. Intake form and health examination form place in nurse's folder for completion. Please call pt at number listed on intake form when available to pick up. Pt has already paid fees for form completion. Please advise.  Cb#: [phone]

## 2024-06-10 ENCOUNTER — Encounter (INDEPENDENT_AMBULATORY_CARE_PROVIDER_SITE_OTHER): Admitting: Ophthalmology

## 2024-06-10 DIAGNOSIS — H35712 Central serous chorioretinopathy, left eye: Secondary | ICD-10-CM

## 2024-06-10 DIAGNOSIS — H35033 Hypertensive retinopathy, bilateral: Secondary | ICD-10-CM

## 2024-06-10 DIAGNOSIS — H43813 Vitreous degeneration, bilateral: Secondary | ICD-10-CM

## 2024-06-10 DIAGNOSIS — H353221 Exudative age-related macular degeneration, left eye, with active choroidal neovascularization: Secondary | ICD-10-CM | POA: Diagnosis not present

## 2024-06-10 DIAGNOSIS — I10 Essential (primary) hypertension: Secondary | ICD-10-CM | POA: Diagnosis not present

## 2024-06-10 DIAGNOSIS — H2513 Age-related nuclear cataract, bilateral: Secondary | ICD-10-CM

## 2024-06-16 ENCOUNTER — Encounter: Payer: Self-pay | Admitting: Family Medicine

## 2024-06-16 ENCOUNTER — Ambulatory Visit (INDEPENDENT_AMBULATORY_CARE_PROVIDER_SITE_OTHER): Admitting: Family Medicine

## 2024-06-16 VITALS — BP 120/76 | HR 72 | Temp 97.9°F | Ht 62.0 in | Wt 175.2 lb

## 2024-06-16 DIAGNOSIS — Z0184 Encounter for antibody response examination: Secondary | ICD-10-CM

## 2024-06-16 DIAGNOSIS — E039 Hypothyroidism, unspecified: Secondary | ICD-10-CM

## 2024-06-16 DIAGNOSIS — Z23 Encounter for immunization: Secondary | ICD-10-CM

## 2024-06-16 DIAGNOSIS — Z111 Encounter for screening for respiratory tuberculosis: Secondary | ICD-10-CM

## 2024-06-16 NOTE — Addendum Note (Signed)
 Addended by: ANGELENA RONAL BRADLEY K on: 06/16/2024 10:33 AM   Modules accepted: Orders

## 2024-06-16 NOTE — Addendum Note (Signed)
 Addended by: ANGELENA RONAL BRADLEY K on: 06/16/2024 10:57 AM   Modules accepted: Orders

## 2024-06-16 NOTE — Progress Notes (Signed)
 Subjective:    Patient ID: Yvonne Wu, female    DOB: 12/28/1955, 68 y.o.   MRN: [id number]  Patient needs proof of immunity for school physical.  They require proof of immunity to measles mumps rubella as well as hepatitis B.  They also require Tdap.  Patient also has a small palpable nodule in the flexor tendon of the left thumb.  It is tender to palpation.  She denies any locking. Past Medical History:  Diagnosis Date   Anxiety    Depression    situational   Eczema    Hypertension    Hypothyroid    Urinary, incontinence, stress female 02/15/2015   Past Surgical History:  Procedure Laterality Date   BLADDER SUSPENSION N/A 02/15/2015   Procedure: TRANSVAGINAL TAPE (TVT) Mid-Urethral Sling PROCEDURE;  Surgeon: Robbi Render, MD;  Location: [facility];  Service: Gynecology;  Laterality: N/A;  2 hrs.   BUNIONECTOMY Left    CYSTOCELE REPAIR N/A 02/15/2015   Procedure: ANTERIOR REPAIR (CYSTOCELE)/Colporrhaphy;  Surgeon: Robbi Render, MD;  Location: [facility];  Service: Gynecology;  Laterality: N/A;   CYSTOSCOPY N/A 02/15/2015   Procedure: CYSTOSCOPY;  Surgeon: Robbi Render, MD;  Location: [facility];  Service: Gynecology;  Laterality: N/A;   TUBAL LIGATION     Current Outpatient Medications on File Prior to Visit  Medication Sig Dispense Refill   celecoxib  (CELEBREX ) 200 MG capsule TAKE (1) CAPSULE BY MOUTH TWICE DAILY. 180 capsule 0   clonazePAM  (KLONOPIN ) 0.5 MG tablet Take 1 tablet (0.5 mg total) by mouth 2 (two) times daily as needed for anxiety. 30 tablet 1   hydrochlorothiazide  (HYDRODIURIL ) 25 MG tablet Take 1 tablet (25 mg total) by mouth daily. 90 tablet 3   levocetirizine (XYZAL  ALLERGY 24HR) 5 MG tablet Take 1 tablet (5 mg total) by mouth every evening. 30 tablet 11   levothyroxine  (SYNTHROID ) 75 MCG tablet Take 1 tablet (75 mcg total) by mouth daily before breakfast. 90 tablet 2   olmesartan  (BENICAR ) 20 MG tablet TAKE ONE TABLET BY MOUTH EVERY DAY 90 tablet 0   SYNTHROID  75 MCG  tablet Take 1 tablet (75 mcg total) by mouth daily before breakfast. 30 tablet 0   gabapentin  (NEURONTIN ) 100 MG capsule Take 1 capsule (100 mg total) by mouth 3 (three) times daily for 7 days. (Patient not taking: Reported on 06/16/2024) 21 capsule 0   valACYclovir  (VALTREX ) 1000 MG tablet Take 1 tablet (1,000 mg total) by mouth 3 (three) times daily. (Patient not taking: Reported on 06/16/2024) 21 tablet 0   No current facility-administered medications on file prior to visit.   Allergies  Allergen Reactions   Codeine     Per patient she gets real sick    Social History   Socioeconomic History   Marital status: Widowed    Spouse name: Not on file   Number of children: Not on file   Years of education: Not on file   Highest education level: Not on file  Occupational History   Not on file  Tobacco Use   Smoking status: Former    Current packs/day: 0.00    Types: Cigarettes    Quit date: 02/22/1995    Years since quitting: 29.3   Smokeless tobacco: Never  Substance and Sexual Activity   Alcohol use: No   Drug use: No   Sexual activity: Not Currently  Other Topics Concern   Not on file  Social History Narrative   Works at [employer].   Involves a lot  of standing and bending.   Husband passed away in 04-18-2014with leukemia.   Patient states that her daughter is age 82 (as of 16) and has bipolar. This daughter lives with her.   This daughter has a child who is 44 years old and another child he was 108 years old. They all live with the patient.   Patient also has a son who lives in [place].      Social Drivers of Health   Financial Resource Strain: Low Risk  (10/22/2023)   Overall Financial Resource Strain (CARDIA)    Difficulty of Paying Living Expenses: Not hard at all  Food Insecurity: No Food Insecurity (10/22/2023)   Hunger Vital Sign    Worried About Running Out of Food in the Last Year: Never true    Ran Out of Food in the Last Year: Never true  Transportation Needs: No  Transportation Needs (10/22/2023)   PRAPARE - Administrator, Civil Service (Medical): No    Lack of Transportation (Non-Medical): No  Physical Activity: Insufficiently Active (10/22/2023)   Exercise Vital Sign    Days of Exercise per Week: 3 days    Minutes of Exercise per Session: 40 min  Stress: Stress Concern Present (10/22/2023)   [employer] of Occupational Health - Occupational Stress Questionnaire    Feeling of Stress : To some extent  Social Connections: Socially Isolated (10/22/2023)   Social Connection and Isolation Panel    Frequency of Communication with Friends and Family: Twice a week    Frequency of Social Gatherings with Friends and Family: Three times a week    Attends Religious Services: Never    Active Member of Clubs or Organizations: No    Attends Banker Meetings: Never    Marital Status: Widowed  Intimate Partner Violence: Not At Risk (10/22/2023)   Humiliation, Afraid, Rape, and Kick questionnaire    Fear of Current or Ex-Partner: No    Emotionally Abused: No    Physically Abused: No    Sexually Abused: No    Review of Systems  All other systems reviewed and are negative.      Objective:   Physical Exam Vitals reviewed.  Constitutional:      General: She is not in acute distress.    Appearance: Normal appearance. She is normal weight. She is not ill-appearing, toxic-appearing or diaphoretic.  Cardiovascular:     Rate and Rhythm: Normal rate and regular rhythm.     Pulses: Normal pulses.     Heart sounds: Normal heart sounds. No murmur heard.    No gallop.  Pulmonary:     Effort: Pulmonary effort is normal. No respiratory distress.     Breath sounds: Normal breath sounds. No stridor. No wheezing, rhonchi or rales.  Chest:     Chest wall: No tenderness.  Abdominal:     General: Bowel sounds are normal. There is no distension.     Palpations: Abdomen is soft. There is no mass.     Tenderness: There is no abdominal  tenderness. There is no guarding or rebound.  Musculoskeletal:     Right hip: Tenderness and bony tenderness present. No crepitus. Normal range of motion. Normal strength.     Right lower leg: No edema.     Left lower leg: No edema.       Legs:  Skin:    Findings: No rash.  Neurological:     General: No focal deficit present.  Mental Status: She is alert and oriented to person, place, and time. Mental status is at baseline.     Cranial Nerves: No cranial nerve deficit.     Motor: No weakness.     Gait: Gait normal.  Psychiatric:        Mood and Affect: Mood normal.        Thought Content: Thought content normal.        Judgment: Judgment normal.           Assessment & Plan:  Screening-pulmonary TB - Plan: QuantiFERON-TB Gold Plus  Immunity status testing - Plan: Measles/Mumps/Rubella Immunity I will screen for TB with a QuantiFERON gold test.  I will prove immunity to measles mumps and rubella with antibody titers.  Patient received Tdap today.  She also received hepatitis B vaccination.  I believe the nodule in the flexor tendon of the left thumb is likely the trigger finger developing versus a ganglion cyst.  We will monitor and refer to orthopedics if it grows or becomes painful

## 2024-06-18 LAB — TEST AUTHORIZATION

## 2024-06-18 LAB — TSH: TSH: 1.31 m[IU]/L (ref 0.40–4.50)

## 2024-06-18 LAB — MEASLES/MUMPS/RUBELLA IMMUNITY
Mumps IgG: >300 [AU]/ml
Rubella: 26.6 {index}
Rubeola IgG: >300 [AU]/ml

## 2024-06-20 LAB — QUANTIFERON-TB GOLD PLUS
Mitogen-NIL: 6.93 [IU]/mL
NIL: 0.03 [IU]/mL
QuantiFERON-TB Gold Plus: NEGATIVE
TB1-NIL: 0 [IU]/mL
TB2-NIL: 0 [IU]/mL

## 2024-06-21 ENCOUNTER — Ambulatory Visit: Payer: Self-pay | Admitting: Family Medicine

## 2024-06-24 ENCOUNTER — Telehealth: Payer: Self-pay | Admitting: Family Medicine

## 2024-06-24 NOTE — Telephone Encounter (Signed)
 Copied from CRM [id number]. Topic: General - Other >> Jun 24, 2024 10:28 AM Zebedee SAUNDERS wrote: Reason for CRM: Pt called for Tinnitus shot available please call [phone] her to schedule appt  Please advise patient if immunization is in stock. Thank you

## 2024-06-28 ENCOUNTER — Ambulatory Visit (INDEPENDENT_AMBULATORY_CARE_PROVIDER_SITE_OTHER)

## 2024-06-28 DIAGNOSIS — Z23 Encounter for immunization: Secondary | ICD-10-CM

## 2024-06-28 NOTE — Progress Notes (Signed)
 Patient is in office today for a nurse visit for Immunization. Patient Injection was given in the  Right deltoid. Patient tolerated injection well.

## 2024-07-01 ENCOUNTER — Other Ambulatory Visit: Payer: Self-pay | Admitting: Family Medicine

## 2024-07-01 DIAGNOSIS — I10 Essential (primary) hypertension: Secondary | ICD-10-CM

## 2024-07-04 ENCOUNTER — Other Ambulatory Visit (HOSPITAL_COMMUNITY): Payer: Self-pay | Admitting: Obstetrics

## 2024-07-04 DIAGNOSIS — M8580 Other specified disorders of bone density and structure, unspecified site: Secondary | ICD-10-CM

## 2024-07-04 DIAGNOSIS — M8588 Other specified disorders of bone density and structure, other site: Secondary | ICD-10-CM

## 2024-07-05 ENCOUNTER — Ambulatory Visit (HOSPITAL_COMMUNITY)
Admission: RE | Admit: 2024-07-05 | Discharge: 2024-07-05 | Disposition: A | Discharge status: Home / Self Care | Source: Ambulatory Visit | Attending: Obstetrics | Admitting: Obstetrics

## 2024-07-05 DIAGNOSIS — M8588 Other specified disorders of bone density and structure, other site: Secondary | ICD-10-CM | POA: Diagnosis present

## 2024-07-05 DIAGNOSIS — M8580 Other specified disorders of bone density and structure, unspecified site: Secondary | ICD-10-CM | POA: Insufficient documentation

## 2024-07-08 ENCOUNTER — Encounter (INDEPENDENT_AMBULATORY_CARE_PROVIDER_SITE_OTHER): Admitting: Ophthalmology

## 2024-07-08 DIAGNOSIS — H353221 Exudative age-related macular degeneration, left eye, with active choroidal neovascularization: Secondary | ICD-10-CM

## 2024-07-08 DIAGNOSIS — H35033 Hypertensive retinopathy, bilateral: Secondary | ICD-10-CM

## 2024-07-08 DIAGNOSIS — H43813 Vitreous degeneration, bilateral: Secondary | ICD-10-CM

## 2024-07-08 DIAGNOSIS — I10 Essential (primary) hypertension: Secondary | ICD-10-CM | POA: Diagnosis not present

## 2024-07-18 ENCOUNTER — Other Ambulatory Visit: Payer: Self-pay | Admitting: Family Medicine

## 2024-07-19 NOTE — Telephone Encounter (Signed)
 Requested Prescriptions  Pending Prescriptions Disp Refills   levocetirizine (XYZAL ) 5 MG tablet [Pharmacy Med Name: levocetirizine 5 mg tablet] 90 tablet 1    Sig: TAKE 1 TABLET BY MOUTH EACH EVENING.     Ear, Nose, and Throat:  Antihistamines - levocetirizine dihydrochloride  Passed - 07/19/2024  5:33 PM      Passed - Cr in normal range and within 360 days    Creat  Date Value Ref Range Status  12/04/2023 0.69 0.50 - 1.05 mg/dL Final         Passed - eGFR is 10 or above and within 360 days    GFR, Est African American  Date Value Ref Range Status  04/02/2020 105 > OR = 60 mL/min/1.44m2 Final   GFR, Est Non African American  Date Value Ref Range Status  04/02/2020 91 > OR = 60 mL/min/1.73m2 Final   eGFR  Date Value Ref Range Status  12/04/2023 95 > OR = 60 mL/min/1.15m2 Final         Passed - Valid encounter within last 12 months    Recent Outpatient Visits           1 month ago Screening-pulmonary TB   [place] [facility] Pickard, Butler DASEN, MD   7 months ago Sore throat   [place] [facility] Duanne, Butler DASEN, MD   1 year ago Benign essential HTN   [place] [facility] Duanne, Butler DASEN, MD   1 year ago Hypothyroidism, unspecified type   [place] [facility] Duanne Butler DASEN, MD               SYNTHROID  75 MCG tablet [Pharmacy Med Name: Synthroid  75 mcg tablet] 90 tablet 1    Sig: Take 1 tablet (75 mcg total) by mouth daily before breakfast.     Endocrinology:  Hypothyroid Agents Passed - 07/19/2024  5:33 PM      Passed - TSH in normal range and within 360 days    TSH  Date Value Ref Range Status  06/16/2024 1.31 0.40 - 4.50 mIU/L Final         Passed - Valid encounter within last 12 months    Recent Outpatient Visits           1 month ago Screening-pulmonary TB   [place] [facility] Duanne Butler DASEN, MD   7 months ago Sore throat   [place] [facility] Duanne Butler DASEN, MD   1 year ago Benign essential HTN   [place] [facility] Duanne, Butler DASEN, MD   1 year ago Hypothyroidism, unspecified type   [place] [facility] Pickard, Butler DASEN, MD

## 2024-08-08 ENCOUNTER — Encounter: Admitting: Family Medicine

## 2024-08-08 ENCOUNTER — Encounter (INDEPENDENT_AMBULATORY_CARE_PROVIDER_SITE_OTHER): Admitting: Ophthalmology

## 2024-08-08 DIAGNOSIS — H35712 Central serous chorioretinopathy, left eye: Secondary | ICD-10-CM | POA: Diagnosis not present

## 2024-08-08 DIAGNOSIS — H353221 Exudative age-related macular degeneration, left eye, with active choroidal neovascularization: Secondary | ICD-10-CM | POA: Diagnosis not present

## 2024-08-08 DIAGNOSIS — I10 Essential (primary) hypertension: Secondary | ICD-10-CM | POA: Diagnosis not present

## 2024-08-08 DIAGNOSIS — H35033 Hypertensive retinopathy, bilateral: Secondary | ICD-10-CM | POA: Diagnosis not present

## 2024-08-08 DIAGNOSIS — H43813 Vitreous degeneration, bilateral: Secondary | ICD-10-CM

## 2024-08-08 DIAGNOSIS — H2513 Age-related nuclear cataract, bilateral: Secondary | ICD-10-CM

## 2024-08-09 ENCOUNTER — Encounter: Payer: Self-pay | Admitting: Family Medicine

## 2024-08-09 ENCOUNTER — Ambulatory Visit: Admitting: Family Medicine

## 2024-08-09 VITALS — BP 130/78 | HR 58 | Temp 97.6°F | Ht 62.0 in | Wt 176.0 lb

## 2024-08-09 DIAGNOSIS — Z23 Encounter for immunization: Secondary | ICD-10-CM | POA: Diagnosis not present

## 2024-08-09 DIAGNOSIS — I10 Essential (primary) hypertension: Secondary | ICD-10-CM | POA: Diagnosis not present

## 2024-08-09 DIAGNOSIS — E039 Hypothyroidism, unspecified: Secondary | ICD-10-CM | POA: Diagnosis not present

## 2024-08-09 DIAGNOSIS — E7800 Pure hypercholesterolemia, unspecified: Secondary | ICD-10-CM

## 2024-08-09 DIAGNOSIS — Z0000 Encounter for general adult medical examination without abnormal findings: Secondary | ICD-10-CM

## 2024-08-09 LAB — CBC WITH DIFFERENTIAL/PLATELET
Absolute Lymphocytes: 2063 {cells}/uL (ref 850–3900)
Absolute Monocytes: 451 {cells}/uL (ref 200–950)
Basophils Absolute: 39 {cells}/uL (ref 0–200)
Basophils Relative: 0.8 %
Eosinophils Absolute: 191 {cells}/uL (ref 15–500)
Eosinophils Relative: 3.9 %
HCT: 37.8 % (ref 35.0–45.0)
Hemoglobin: 12.6 g/dL (ref 11.7–15.5)
MCH: 30.7 pg (ref 27.0–33.0)
MCHC: 33.3 g/dL (ref 32.0–36.0)
MCV: 92.2 fL (ref 80.0–100.0)
MPV: 10.2 fL (ref 7.5–12.5)
Monocytes Relative: 9.2 %
Neutro Abs: 2156 {cells}/uL (ref 1500–7800)
Neutrophils Relative %: 44 %
Platelets: 302 Thousand/uL (ref 140–400)
RBC: 4.1 Million/uL (ref 3.80–5.10)
RDW: 12.8 % (ref 11.0–15.0)
Total Lymphocyte: 42.1 %
WBC: 4.9 Thousand/uL (ref 3.8–10.8)

## 2024-08-09 LAB — COMPREHENSIVE METABOLIC PANEL WITH GFR
AG Ratio: 2 (calc) (ref 1.0–2.5)
ALT: 20 U/L (ref 6–29)
AST: 17 U/L (ref 10–35)
Albumin: 4.6 g/dL (ref 3.6–5.1)
Alkaline phosphatase (APISO): 78 U/L (ref 37–153)
BUN/Creatinine Ratio: 11 (calc) (ref 6–22)
BUN: 13 mg/dL (ref 7–25)
CO2: 29 mmol/L (ref 20–32)
Calcium: 9.4 mg/dL (ref 8.6–10.4)
Chloride: 102 mmol/L (ref 98–110)
Creat: 1.18 mg/dL — ABNORMAL HIGH (ref 0.50–1.05)
Globulin: 2.3 g/dL (ref 1.9–3.7)
Glucose, Bld: 78 mg/dL (ref 65–99)
Potassium: 3.6 mmol/L (ref 3.5–5.3)
Sodium: 141 mmol/L (ref 135–146)
Total Bilirubin: 0.4 mg/dL (ref 0.2–1.2)
Total Protein: 6.9 g/dL (ref 6.1–8.1)
eGFR: 50 mL/min/1.73m2 — ABNORMAL LOW (ref >=60)

## 2024-08-09 LAB — LIPID PANEL
Cholesterol: 205 mg/dL — ABNORMAL HIGH (ref <200)
HDL: 51 mg/dL (ref >=50)
LDL Cholesterol (Calc): 127 mg/dL — ABNORMAL HIGH
Non-HDL Cholesterol (Calc): 154 mg/dL — ABNORMAL HIGH (ref <130)
Total CHOL/HDL Ratio: 4 (calc) (ref <5.0)
Triglycerides: 157 mg/dL — ABNORMAL HIGH (ref <150)

## 2024-08-09 LAB — TSH: TSH: 1.57 m[IU]/L (ref 0.40–4.50)

## 2024-08-09 NOTE — Progress Notes (Signed)
 Subjective:    Patient ID: Yvonne Wu, female    DOB: 01-05-1956, 68 y.o.   MRN: [id number]  HPI Patient is here today for a physical exam.  Patient had her mammogram earlier this summer.  It was normal.  Her last colonoscopy was in 2020.  She is due again in 2023.  She had a bone density test in July.  This showed a T-score of -2.3.  She is taking calcium and vitamin D .  She is due to repeat this 2 years.  Her immunizations are up-to-date except for the flu shot.  She would like to receive that today.  We also discussed RSV and COVID.  She defers these at the present time.  Otherwise she is doing well with no concerns.  Last year we performed a coronary artery calcium score.  Her level was 0 which was outstanding.  Her blood pressure today is well-controlled at 130/78  Immunization History  Administered Date(s) Administered   Fluad Trivalent(High Dose 65+) 12/04/2023   Hepb-cpg 06/16/2024   INFLUENZA, HIGH DOSE SEASONAL PF 09/08/2019   Influenza,inj,Quad PF,6+ Mos 07/09/2017, 07/08/2018, 07/20/2019   Influenza-Unspecified 07/20/2020, 09/26/2022   Moderna Sars-Covid-2 Vaccination 12/26/2019, 01/24/2020, 10/04/2020   PNEUMOCOCCAL CONJUGATE-20 10/28/2022   Pfizer(Comirnaty)Fall Seasonal Vaccine 12 years and older 09/29/2022   Tdap 10/02/2011, 06/28/2024   Tetanus 10/20/1997    Past Medical History:  Diagnosis Date   Anxiety    Depression    situational   Eczema    Hypertension    Hypothyroid    Urinary, incontinence, stress female 02/15/2015   Past Surgical History:  Procedure Laterality Date   BLADDER SUSPENSION N/A 02/15/2015   Procedure: TRANSVAGINAL TAPE (TVT) Mid-Urethral Sling PROCEDURE;  Surgeon: Robbi Render, MD;  Location: [facility];  Service: Gynecology;  Laterality: N/A;  2 hrs.   BUNIONECTOMY Left    CYSTOCELE REPAIR N/A 02/15/2015   Procedure: ANTERIOR REPAIR (CYSTOCELE)/Colporrhaphy;  Surgeon: Robbi Render, MD;  Location: [facility];  Service: Gynecology;  Laterality:  N/A;   CYSTOSCOPY N/A 02/15/2015   Procedure: CYSTOSCOPY;  Surgeon: Robbi Render, MD;  Location: [facility];  Service: Gynecology;  Laterality: N/A;   TUBAL LIGATION     Current Outpatient Medications on File Prior to Visit  Medication Sig Dispense Refill   celecoxib  (CELEBREX ) 200 MG capsule TAKE (1) CAPSULE BY MOUTH TWICE DAILY. 180 capsule 0   clonazePAM  (KLONOPIN ) 0.5 MG tablet Take 1 tablet (0.5 mg total) by mouth 2 (two) times daily as needed for anxiety. 30 tablet 1   hydrochlorothiazide  (HYDRODIURIL ) 25 MG tablet Take 1 tablet (25 mg total) by mouth daily. 90 tablet 3   levocetirizine (XYZAL ) 5 MG tablet TAKE 1 TABLET BY MOUTH EACH EVENING. 90 tablet 1   levothyroxine  (SYNTHROID ) 75 MCG tablet Take 1 tablet (75 mcg total) by mouth daily before breakfast. 90 tablet 2   olmesartan  (BENICAR ) 20 MG tablet TAKE ONE TABLET BY MOUTH EVERY DAY 90 tablet 0   SYNTHROID  75 MCG tablet Take 1 tablet (75 mcg total) by mouth daily before breakfast. 90 tablet 1   gabapentin  (NEURONTIN ) 100 MG capsule Take 1 capsule (100 mg total) by mouth 3 (three) times daily for 7 days. (Patient not taking: Reported on 06/16/2024) 21 capsule 0   valACYclovir  (VALTREX ) 1000 MG tablet Take 1 tablet (1,000 mg total) by mouth 3 (three) times daily. (Patient not taking: Reported on 06/16/2024) 21 tablet 0   No current facility-administered medications on file prior to visit.   Allergies  Allergen Reactions   Codeine     Per patient she gets real sick    Social History   Socioeconomic History   Marital status: Widowed    Spouse name: Not on file   Number of children: Not on file   Years of education: Not on file   Highest education level: Not on file  Occupational History   Not on file  Tobacco Use   Smoking status: Former    Current packs/day: 0.00    Types: Cigarettes    Quit date: 02/22/1995    Years since quitting: 29.4   Smokeless tobacco: Never  Substance and Sexual Activity   Alcohol use: No   Drug use:  No   Sexual activity: Not Currently  Other Topics Concern   Not on file  Social History Narrative   Works at [employer].   Involves a lot of standing and bending.   Husband passed away in May 27, 2014with leukemia.   Patient states that her daughter is age 10 (as of 78) and has bipolar. This daughter lives with her.   This daughter has a child who is 54 years old and another child he was 64 years old. They all live with the patient.   Patient also has a son who lives in [place].      Social Drivers of Health   Financial Resource Strain: Low Risk  (10/22/2023)   Overall Financial Resource Strain (CARDIA)    Difficulty of Paying Living Expenses: Not hard at all  Food Insecurity: No Food Insecurity (10/22/2023)   Hunger Vital Sign    Worried About Running Out of Food in the Last Year: Never true    Ran Out of Food in the Last Year: Never true  Transportation Needs: No Transportation Needs (10/22/2023)   PRAPARE - Administrator, Civil Service (Medical): No    Lack of Transportation (Non-Medical): No  Physical Activity: Insufficiently Active (10/22/2023)   Exercise Vital Sign    Days of Exercise per Week: 3 days    Minutes of Exercise per Session: 40 min  Stress: Stress Concern Present (10/22/2023)   [employer] of Occupational Health - Occupational Stress Questionnaire    Feeling of Stress : To some extent  Social Connections: Socially Isolated (10/22/2023)   Social Connection and Isolation Panel    Frequency of Communication with Friends and Family: Twice a week    Frequency of Social Gatherings with Friends and Family: Three times a week    Attends Religious Services: Never    Active Member of Clubs or Organizations: No    Attends Banker Meetings: Never    Marital Status: Widowed  Intimate Partner Violence: Not At Risk (10/22/2023)   Humiliation, Afraid, Rape, and Kick questionnaire    Fear of Current or Ex-Partner: No    Emotionally Abused: No    Physically  Abused: No    Sexually Abused: No    Review of Systems  All other systems reviewed and are negative.      Objective:   Physical Exam Vitals reviewed.  Constitutional:      General: She is not in acute distress.    Appearance: Normal appearance. She is normal weight. She is not ill-appearing, toxic-appearing or diaphoretic.  Cardiovascular:     Rate and Rhythm: Normal rate and regular rhythm.     Pulses: Normal pulses.     Heart sounds: Normal heart sounds. No murmur heard.    No gallop.  Pulmonary:  Effort: Pulmonary effort is normal. No respiratory distress.     Breath sounds: Normal breath sounds. No stridor. No wheezing, rhonchi or rales.  Chest:     Chest wall: No tenderness.  Abdominal:     General: Bowel sounds are normal. There is no distension.     Palpations: Abdomen is soft. There is no mass.     Tenderness: There is no abdominal tenderness. There is no guarding or rebound.  Skin:    Findings: No rash.  Neurological:     General: No focal deficit present.     Mental Status: She is alert and oriented to person, place, and time. Mental status is at baseline.     Cranial Nerves: No cranial nerve deficit.     Motor: No weakness.     Gait: Gait normal.  Psychiatric:        Mood and Affect: Mood normal.        Thought Content: Thought content normal.        Judgment: Judgment normal.           Assessment & Plan:  General medical exam - Plan: CBC with Differential/Platelet, Comprehensive metabolic panel with GFR, Lipid panel, TSH  Pure hypercholesterolemia - Plan: CBC with Differential/Platelet, Comprehensive metabolic panel with GFR, Lipid panel, TSH  Hypothyroidism, unspecified type - Plan: CBC with Differential/Platelet, Comprehensive metabolic panel with GFR, Lipid panel, TSH  Benign essential HTN - Plan: CBC with Differential/Platelet, Comprehensive metabolic panel with GFR, Lipid panel, TSH  Immunization due - Plan: Flu vaccine HIGH DOSE PF(Fluzone  Trivalent) Physical exam today is completely normal.  Blood pressure is excellent.  Breast cancer screening and colon cancer screening up-to-date.  She does have borderline osteoporosis.  I recommended 1200 mg of calcium and 1000 units a day of vitamin D .  I would repeat a bone density test in 2 years.  Patient received her flu shot.  We also discussed COVID and RSV.  Her tetanus, pneumonia, and shingles vaccines are up-to-date.  I will check a CBC CMP lipid panel TSH.  Due to her excellent coronary artery calcium score, I would not except a LDL cholesterol less than 869.

## 2024-08-11 ENCOUNTER — Ambulatory Visit: Payer: Self-pay | Admitting: Family Medicine

## 2024-08-15 ENCOUNTER — Other Ambulatory Visit: Payer: Self-pay | Admitting: Family Medicine

## 2024-08-15 DIAGNOSIS — M545 Low back pain, unspecified: Secondary | ICD-10-CM

## 2024-08-26 ENCOUNTER — Telehealth: Payer: Self-pay

## 2024-08-26 NOTE — Telephone Encounter (Signed)
 Copied from CRM #[id number]. Topic: Clinical - Lab/Test Results >> Aug 26, 2024  4:08 PM Delon DASEN wrote: Reason for CRM: patient returned call about labs, read message in Roberta, no questions

## 2024-09-05 ENCOUNTER — Encounter (INDEPENDENT_AMBULATORY_CARE_PROVIDER_SITE_OTHER): Admitting: Ophthalmology

## 2024-09-05 DIAGNOSIS — H353221 Exudative age-related macular degeneration, left eye, with active choroidal neovascularization: Secondary | ICD-10-CM

## 2024-09-05 DIAGNOSIS — H35712 Central serous chorioretinopathy, left eye: Secondary | ICD-10-CM | POA: Diagnosis not present

## 2024-09-05 DIAGNOSIS — I10 Essential (primary) hypertension: Secondary | ICD-10-CM

## 2024-09-05 DIAGNOSIS — H35033 Hypertensive retinopathy, bilateral: Secondary | ICD-10-CM

## 2024-09-05 DIAGNOSIS — H43813 Vitreous degeneration, bilateral: Secondary | ICD-10-CM

## 2024-09-24 ENCOUNTER — Other Ambulatory Visit: Payer: Self-pay | Admitting: Family Medicine

## 2024-09-24 DIAGNOSIS — I10 Essential (primary) hypertension: Secondary | ICD-10-CM

## 2024-09-27 ENCOUNTER — Telehealth: Payer: Self-pay

## 2024-09-27 ENCOUNTER — Other Ambulatory Visit: Payer: Self-pay

## 2024-09-27 DIAGNOSIS — J029 Acute pharyngitis, unspecified: Secondary | ICD-10-CM

## 2024-09-27 MED ORDER — CLONAZEPAM 0.5 MG PO TABS: 0.5000 mg | ORAL_TABLET | Freq: Two times a day (BID) | ORAL | 1 refills | Status: AC | PRN

## 2024-09-27 NOTE — Telephone Encounter (Signed)
 Prescription Request  09/27/2024  LOV: 08/09/24  What is the name of the medication or equipment? clonazePAM  (KLONOPIN ) 0.5 MG tablet [[id number]]   Have you contacted your pharmacy to request a refill? Yes   Which pharmacy would you like this sent to?  [facility] [place], [place] - [id number] [place] [address] Phone: [phone] Fax: [phone]    Patient notified that their request is being sent to the clinical staff for review and that they should receive a response within 2 business days.   Please advise at [facility] [phone]

## 2024-09-27 NOTE — Telephone Encounter (Signed)
Sent to PCP to sign

## 2024-10-03 ENCOUNTER — Encounter (INDEPENDENT_AMBULATORY_CARE_PROVIDER_SITE_OTHER): Admitting: Ophthalmology

## 2024-10-03 DIAGNOSIS — H35033 Hypertensive retinopathy, bilateral: Secondary | ICD-10-CM | POA: Diagnosis not present

## 2024-10-03 DIAGNOSIS — H353221 Exudative age-related macular degeneration, left eye, with active choroidal neovascularization: Secondary | ICD-10-CM

## 2024-10-03 DIAGNOSIS — H2513 Age-related nuclear cataract, bilateral: Secondary | ICD-10-CM | POA: Diagnosis not present

## 2024-10-03 DIAGNOSIS — H35712 Central serous chorioretinopathy, left eye: Secondary | ICD-10-CM

## 2024-10-03 DIAGNOSIS — I10 Essential (primary) hypertension: Secondary | ICD-10-CM | POA: Diagnosis not present

## 2024-10-03 DIAGNOSIS — H43813 Vitreous degeneration, bilateral: Secondary | ICD-10-CM | POA: Diagnosis not present

## 2024-10-31 ENCOUNTER — Encounter (INDEPENDENT_AMBULATORY_CARE_PROVIDER_SITE_OTHER): Admitting: Ophthalmology

## 2024-10-31 DIAGNOSIS — I10 Essential (primary) hypertension: Secondary | ICD-10-CM

## 2024-10-31 DIAGNOSIS — H43813 Vitreous degeneration, bilateral: Secondary | ICD-10-CM

## 2024-10-31 DIAGNOSIS — H2513 Age-related nuclear cataract, bilateral: Secondary | ICD-10-CM

## 2024-10-31 DIAGNOSIS — H353221 Exudative age-related macular degeneration, left eye, with active choroidal neovascularization: Secondary | ICD-10-CM

## 2024-10-31 DIAGNOSIS — H35033 Hypertensive retinopathy, bilateral: Secondary | ICD-10-CM | POA: Diagnosis not present

## 2024-11-16 ENCOUNTER — Ambulatory Visit

## 2024-11-17 ENCOUNTER — Ambulatory Visit

## 2024-11-17 VITALS — Ht 62.0 in | Wt 176.0 lb

## 2024-11-17 DIAGNOSIS — Z0000 Encounter for general adult medical examination without abnormal findings: Secondary | ICD-10-CM

## 2024-11-17 NOTE — Progress Notes (Signed)
 "  Chief Complaint  Patient presents with   Medicare Wellness     Subjective:   Yvonne Wu is a 69 y.o. female who presents for a Medicare Annual Wellness Visit.  Visit info / Clinical Intake: Medicare Wellness Visit Type:: Subsequent Annual Wellness Visit Persons participating in visit and providing information:: patient Medicare Wellness Visit Mode:: Telephone If telephone:: video declined Since this visit was completed virtually, some vitals may be partially provided or unavailable. Missing vitals are due to the limitations of the virtual format.: Documented vitals are patient reported If Telephone or Video please confirm:: I connected with patient using audio/video enable telemedicine. I verified patient identity with two identifiers, discussed telehealth limitations, and patient agreed to proceed. Patient Location:: home Provider Location:: home office Interpreter Needed?: No Pre-visit prep was completed: yes AWV questionnaire completed by patient prior to visit?: no Living arrangements:: with family/others Patient's Overall Health Status Rating: good Typical amount of pain: some Does pain affect daily life?: no Are you currently prescribed opioids?: no  Dietary Habits and Nutritional Risks How many meals a day?: 3 Eats fruit and vegetables daily?: yes Most meals are obtained by: preparing own meals In the last 2 weeks, have you had any of the following?: none Diabetic:: no  Functional Status Activities of Daily Living (to include ambulation/medication): Independent Ambulation: Independent Medication Administration: Independent Home Management (perform basic housework or laundry): Independent Manage your own finances?: yes Primary transportation is: driving Concerns about vision?: no *vision screening is required for WTM* Concerns about hearing?: no  Fall Screening Falls in the past year?: 0 Number of falls in past year: 0 Was there an injury with Fall?: 0 Fall  Risk Category Calculator: 0 Patient Fall Risk Level: Low Fall Risk  Fall Risk Patient at Risk for Falls Due to: No Fall Risks Fall risk Follow up: Falls evaluation completed; Education provided; Falls prevention discussed  Home and Transportation Safety: All rugs have non-skid backing?: yes All stairs or steps have railings?: yes Grab bars in the bathtub or shower?: yes Have non-skid surface in bathtub or shower?: yes Good home lighting?: yes Regular seat belt use?: yes Hospital stays in the last year:: no  Cognitive Assessment Difficulty concentrating, remembering, or making decisions? : no Will 6CIT or Mini Cog be Completed: no 6CIT or Mini Cog Declined: patient alert, oriented, able to answer questions appropriately and recall recent events  Advance Directives (For Healthcare) Does Patient Have a Medical Advance Directive?: No Would patient like information on creating a medical advance directive?: Yes (MAU/Ambulatory/Procedural Areas - Information given)  Reviewed/Updated  Reviewed/Updated: Reviewed All (Medical, Surgical, Family, Medications, Allergies, Care Teams, Patient Goals)    Allergies (verified) Codeine   Current Medications (verified) Outpatient Encounter Medications as of 11/17/2024  Medication Sig   celecoxib  (CELEBREX ) 200 MG capsule TAKE (1) CAPSULE BY MOUTH TWICE DAILY.   clonazePAM  (KLONOPIN ) 0.5 MG tablet Take 1 tablet (0.5 mg total) by mouth 2 (two) times daily as needed for anxiety.   hydrochlorothiazide  (HYDRODIURIL ) 25 MG tablet Take 1 tablet (25 mg total) by mouth daily.   levocetirizine (XYZAL ) 5 MG tablet TAKE 1 TABLET BY MOUTH EACH EVENING.   levothyroxine  (SYNTHROID ) 75 MCG tablet Take 1 tablet (75 mcg total) by mouth daily before breakfast.   olmesartan  (BENICAR ) 20 MG tablet TAKE ONE TABLET BY MOUTH EVERY DAY.   SYNTHROID  75 MCG tablet Take 1 tablet (75 mcg total) by mouth daily before breakfast.   [DISCONTINUED] gabapentin  (NEURONTIN ) 100 MG  capsule  Take 1 capsule (100 mg total) by mouth 3 (three) times daily for 7 days. (Patient not taking: Reported on 06/16/2024)   [DISCONTINUED] valACYclovir  (VALTREX ) 1000 MG tablet Take 1 tablet (1,000 mg total) by mouth 3 (three) times daily. (Patient not taking: Reported on 06/16/2024)   No facility-administered encounter medications on file as of 11/17/2024.    History: Past Medical History:  Diagnosis Date   Anxiety    Depression    situational   Eczema    Hypertension    Hypothyroid    Urinary, incontinence, stress female 02/15/2015   Past Surgical History:  Procedure Laterality Date   BLADDER SUSPENSION N/A 02/15/2015   Procedure: TRANSVAGINAL TAPE (TVT) Mid-Urethral Sling PROCEDURE;  Surgeon: Robbi Render, MD;  Location: [facility];  Service: Gynecology;  Laterality: N/A;  2 hrs.   BUNIONECTOMY Left    CYSTOCELE REPAIR N/A 02/15/2015   Procedure: ANTERIOR REPAIR (CYSTOCELE)/Colporrhaphy;  Surgeon: Robbi Render, MD;  Location: [facility];  Service: Gynecology;  Laterality: N/A;   CYSTOSCOPY N/A 02/15/2015   Procedure: CYSTOSCOPY;  Surgeon: Robbi Render, MD;  Location: [facility];  Service: Gynecology;  Laterality: N/A;   TUBAL LIGATION     Family History  Problem Relation Age of Onset   Asthma Mother    Mental illness Mother    Cancer Father 95       lung cancer   Social History   Occupational History   Not on file  Tobacco Use   Smoking status: Former    Current packs/day: 0.00    Types: Cigarettes    Quit date: 02/22/1995    Years since quitting: 29.7   Smokeless tobacco: Never  Substance and Sexual Activity   Alcohol use: No   Drug use: No   Sexual activity: Not Currently   Tobacco Counseling Counseling given: Not Answered  SDOH Screenings   Food Insecurity: No Food Insecurity (11/17/2024)  Housing: Low Risk (11/17/2024)  Transportation Needs: No Transportation Needs (11/17/2024)  Utilities: Not At Risk (11/17/2024)  Alcohol Screen: Low Risk (10/22/2023)  Depression  (PHQ2-9): Low Risk (11/17/2024)  Financial Resource Strain: Low Risk (10/22/2023)  Physical Activity: Insufficiently Active (11/17/2024)  Social Connections: Socially Isolated (11/17/2024)  Stress: No Stress Concern Present (11/17/2024)  Tobacco Use: Medium Risk (11/17/2024)  Health Literacy: Adequate Health Literacy (11/17/2024)   See flowsheets for full screening details  Depression Screen PHQ 2 & 9 Depression Scale- Over the past 2 weeks, how often have you been bothered by any of the following problems? Little interest or pleasure in doing things: 0 Feeling down, depressed, or hopeless (PHQ Adolescent also includes...irritable): 0 PHQ-2 Total Score: 0 Trouble falling or staying asleep, or sleeping too much: 0 Feeling tired or having little energy: 0 Poor appetite or overeating (PHQ Adolescent also includes...weight loss): 0 Feeling bad about yourself - or that you are a failure or have let yourself or your family down: 0 Trouble concentrating on things, such as reading the newspaper or watching television (PHQ Adolescent also includes...like school work): 0 Moving or speaking so slowly that other people could have noticed. Or the opposite - being so fidgety or restless that you have been moving around a lot more than usual: 0 Thoughts that you would be better off dead, or of hurting yourself in some way: 0 PHQ-9 Total Score: 0 If you checked off any problems, how difficult have these problems made it for you to do your work, take care of things at home, or get along with other  people?: Not difficult at all     Goals Addressed             This Visit's Progress    Maintain health and independence   On track            Objective:    Today's Vitals   11/17/24 1202  Weight: 176 lb (79.8 kg)  Height: 5' 2 (1.575 m)   Body mass index is 32.19 kg/m.  Hearing/Vision screen No results found. Immunizations and Health Maintenance Health Maintenance  Topic Date Due   Zoster  Vaccines- Shingrix (1 of 2) Never done   COVID-19 Vaccine (5 - 2025-26 season) 06/20/2024   Medicare Annual Wellness (AWV)  11/17/2025   Mammogram  04/28/2026   Colonoscopy  12/26/2028   Pneumococcal Vaccine: 50+ Years  Completed   Influenza Vaccine  Completed   Bone Density Scan  Completed   Hepatitis C Screening  Completed   Meningococcal B Vaccine  Aged Out   DTaP/Tdap/Td  Discontinued   Hepatitis B Vaccines 19-59 Average Risk  Discontinued        Assessment/Plan:  This is a routine wellness examination for Takiya.  Patient Care Team: Duanne Butler DASEN, MD as PCP - General (Family Medicine) Stuart Norris, NP as Nurse Practitioner (Obstetrics and Gynecology) Alvia Norleen BIRCH, MD as Consulting Physician (Ophthalmology) Kandyce Sor, MD as Consulting Physician (Obstetrics and Gynecology) Kit Norleen, MD as Consulting Physician (Orthopedic Surgery)  I have personally reviewed and noted the following in the patients chart:   Medical and social history Use of alcohol, tobacco or illicit drugs  Current medications and supplements including opioid prescriptions. Functional ability and status Nutritional status Physical activity Advanced directives List of other physicians Hospitalizations, surgeries, and ER visits in previous 12 months Vitals Screenings to include cognitive, depression, and falls Referrals and appointments  No orders of the defined types were placed in this encounter.  In addition, I have reviewed and discussed with patient certain preventive protocols, quality metrics, and best practice recommendations. A written personalized care plan for preventive services as well as general preventive health recommendations were provided to patient.   Lavelle Charmaine Browner, LPN   [id number]/[id number]   Return in 1 year (on 11/17/2025).  After Visit Summary: (MyChart) Due to this being a telephonic visit, the after visit summary with patients personalized plan was  offered to patient via MyChart   Nurse Notes: Appointment(s) made: (6 month followup 02/14/25)  "

## 2024-11-17 NOTE — Patient Instructions (Signed)
 Ms. Headen,  Thank you for taking the time for your Medicare Wellness Visit. I appreciate your continued commitment to your health goals. Please review the care plan we discussed, and feel free to reach out if I can assist you further.  Please note that Annual Wellness Visits do not include a physical exam. Some assessments may be limited, especially if the visit was conducted virtually. If needed, we may recommend an in-person follow-up with your provider.  Ongoing Care Seeing your primary care provider every 3 to 6 months helps us  monitor your health and provide consistent, personalized care.   Referrals If a referral was made during today's visit and you haven't received any updates within two weeks, please contact the referred provider directly to check on the status.  Recommended Screenings:  Health Maintenance  Topic Date Due   Zoster (Shingles) Vaccine (1 of 2) Never done   COVID-19 Vaccine (5 - 2025-26 season) 06/20/2024   Medicare Annual Wellness Visit  11/17/2025   Breast Cancer Screening  04/28/2026   Colon Cancer Screening  12/26/2028   Pneumococcal Vaccine for age over 36  Completed   Flu Shot  Completed   Osteoporosis screening with Bone Density Scan  Completed   Hepatitis C Screening  Completed   Meningitis B Vaccine  Aged Out   DTaP/Tdap/Td vaccine  Discontinued   Hepatitis B Vaccine  Discontinued       11/17/2024   12:05 PM  Advanced Directives  Does Patient Have a Medical Advance Directive? No  Would patient like information on creating a medical advance directive? Yes (MAU/Ambulatory/Procedural Areas - Information given)   Information on Advanced Care Planning can be found at [place]  Secretary of [facility] Advance Health Care Directives Advance Health Care Directives ([email or web address]   Vision: Annual vision screenings are recommended for early detection of glaucoma, cataracts, and diabetic retinopathy. These exams can also reveal signs of chronic conditions such  as diabetes and high blood pressure.  Dental: Annual dental screenings help detect early signs of oral cancer, gum disease, and other conditions linked to overall health, including heart disease and diabetes.  Please see the attached documents for additional preventive care recommendations.

## 2024-11-28 ENCOUNTER — Encounter (INDEPENDENT_AMBULATORY_CARE_PROVIDER_SITE_OTHER): Admitting: Ophthalmology

## 2025-02-14 ENCOUNTER — Ambulatory Visit: Admitting: Family Medicine
